# Patient Record
Sex: Male | Born: 1950
Health system: Southern US, Community
[De-identification: ages and names within clinical notes are randomized; demographics above are authoritative.]

## PROBLEM LIST (undated history)

## (undated) DIAGNOSIS — J45909 Unspecified asthma, uncomplicated: Secondary | ICD-10-CM

## (undated) DIAGNOSIS — M1990 Unspecified osteoarthritis, unspecified site: Secondary | ICD-10-CM

## (undated) DIAGNOSIS — I4892 Unspecified atrial flutter: Secondary | ICD-10-CM

## (undated) DIAGNOSIS — J189 Pneumonia, unspecified organism: Secondary | ICD-10-CM

## (undated) DIAGNOSIS — E119 Type 2 diabetes mellitus without complications: Secondary | ICD-10-CM

## (undated) HISTORY — PX: NO PAST SURGERIES: SHX2092

---
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
## 2003-12-03 ENCOUNTER — Emergency Department (HOSPITAL_COMMUNITY): Admission: EM | Admit: 2003-12-03 | Discharge: 2003-12-03 | Payer: Self-pay | Admitting: Emergency Medicine

## 2003-12-13 ENCOUNTER — Emergency Department (HOSPITAL_COMMUNITY): Admission: AD | Admit: 2003-12-13 | Discharge: 2003-12-13 | Payer: Self-pay | Admitting: Family Medicine

## 2012-09-19 ENCOUNTER — Other Ambulatory Visit: Payer: Self-pay | Admitting: Family Medicine

## 2012-09-19 DIAGNOSIS — M5412 Radiculopathy, cervical region: Secondary | ICD-10-CM

## 2012-09-21 ENCOUNTER — Ambulatory Visit
Admission: RE | Admit: 2012-09-21 | Discharge: 2012-09-21 | Disposition: A | Discharge status: Home / Self Care | Payer: No Typology Code available for payment source | Source: Ambulatory Visit | Attending: Family Medicine | Admitting: Family Medicine

## 2012-09-21 DIAGNOSIS — M5412 Radiculopathy, cervical region: Secondary | ICD-10-CM

## 2013-02-09 ENCOUNTER — Other Ambulatory Visit: Payer: Self-pay | Admitting: Neurosurgery

## 2013-02-23 ENCOUNTER — Encounter (HOSPITAL_COMMUNITY): Payer: Self-pay

## 2013-02-23 ENCOUNTER — Encounter (HOSPITAL_COMMUNITY)
Admission: RE | Admit: 2013-02-23 | Discharge: 2013-02-23 | Disposition: A | Discharge status: Home / Self Care | Payer: No Typology Code available for payment source | Source: Ambulatory Visit | Attending: Neurosurgery | Admitting: Neurosurgery

## 2013-02-23 HISTORY — DX: Unspecified asthma, uncomplicated: J45.909

## 2013-02-23 LAB — CBC
Hemoglobin: 15.4 g/dL (ref 13.0–17.0)
Platelets: 211 10*3/uL (ref 150–400)
RBC: 4.99 MIL/uL (ref 4.22–5.81)
WBC: 9 10*3/uL (ref 4.0–10.5)

## 2013-02-23 LAB — SURGICAL PCR SCREEN
MRSA, PCR: NEGATIVE
Staphylococcus aureus: NEGATIVE

## 2013-02-23 NOTE — Pre-Procedure Instructions (Signed)
Cameron Arroyo  02/23/2013   Your procedure is scheduled on:  Thurs,April 3 @ 12:30 PM  Report to [facility] at 9:30 AM.  Call this number if you have problems the morning of surgery: [phone]   Remember:   Do not eat food or drink liquids after midnight.      Do not wear jewelry  Do not wear lotions, powders, or colognes. You may wear deodorant.  Men may shave face and neck.  Do not bring valuables to the hospital.  Contacts, dentures or bridgework may not be worn into surgery.  Leave suitcase in the car. After surgery it may be brought to your room.  For patients admitted to the hospital, checkout time is 11:00 AM the day of  discharge.   Patients discharged the day of surgery will not be allowed to drive  home.    Special Instructions: Shower using CHG 2 nights before surgery and the night before surgery.  If you shower the day of surgery use CHG.  Use special wash - you have one bottle of CHG for all showers.  You should use approximately 1/3 of the bottle for each shower.   Please read over the following fact sheets that you were given: Pain Booklet, Coughing and Deep Breathing, MRSA Information and Surgical Site Infection Prevention

## 2013-02-23 NOTE — Pre-Procedure Instructions (Signed)
Cameron Arroyo  02/23/2013   Your procedure is scheduled on:  03-01-2013  Take East Elevators to 3rd floor  Report to [facility] at 9:30AM.  Call this number if you have problems the morning of surgery: [phone]   Remember:   Do not eat food or drink liquids after midnight.   Take these medicines the morning of surgery with A SIP OF WATER:none   Do not wear jewelry,  Do not wear lotions, powders, or perfumes. You may wear deodorant.  Do not shave 48 hours prior to surgery. Men may shave face and neck.  Do not bring valuables to the hospital.  Contacts, dentures or bridgework may not be worn into surgery.  Leave suitcase in the car. After surgery it may be brought to your room.   For patients admitted to the hospital, checkout time is 11:00 AM the day of discharge.   Patients discharged the day of surgery will not be allowed to drive home.    Special Instructions: Shower using CHG 2 nights before surgery and the night before surgery.  If you shower the day of surgery use CHG.  Use special wash - you have one bottle of CHG for all showers.  You should use approximately 1/3 of the bottle for each shower.   Please read over the following fact sheets that you were given: Pain Booklet and Surgical Site Infection Prevention

## 2013-02-28 MED ORDER — CEFAZOLIN SODIUM-DEXTROSE 2-3 GM-% IV SOLR
2.0000 g | INTRAVENOUS | Status: AC
  Administered 2013-03-01: 2 g via INTRAVENOUS
  Filled 2013-02-28: qty 50

## 2013-03-01 ENCOUNTER — Inpatient Hospital Stay (HOSPITAL_COMMUNITY)
Admission: RE | Admit: 2013-03-01 | Discharge: 2013-03-02 | DRG: 473 | Disposition: A | Discharge status: Home / Self Care | Payer: No Typology Code available for payment source | Source: Ambulatory Visit | Attending: Neurosurgery | Admitting: Neurosurgery

## 2013-03-01 ENCOUNTER — Encounter (HOSPITAL_COMMUNITY): Payer: Self-pay | Admitting: Anesthesiology

## 2013-03-01 ENCOUNTER — Inpatient Hospital Stay (HOSPITAL_COMMUNITY): Payer: No Typology Code available for payment source

## 2013-03-01 ENCOUNTER — Encounter (HOSPITAL_COMMUNITY): Payer: Self-pay | Admitting: *Deleted

## 2013-03-01 ENCOUNTER — Inpatient Hospital Stay (HOSPITAL_COMMUNITY): Payer: No Typology Code available for payment source | Admitting: Anesthesiology

## 2013-03-01 ENCOUNTER — Encounter (HOSPITAL_COMMUNITY)
Admission: RE | Disposition: A | Discharge status: Home / Self Care | Payer: Self-pay | Source: Ambulatory Visit | Attending: Neurosurgery

## 2013-03-01 DIAGNOSIS — J45909 Unspecified asthma, uncomplicated: Secondary | ICD-10-CM | POA: Diagnosis present

## 2013-03-01 DIAGNOSIS — Z87891 Personal history of nicotine dependence: Secondary | ICD-10-CM

## 2013-03-01 DIAGNOSIS — M47812 Spondylosis without myelopathy or radiculopathy, cervical region: Secondary | ICD-10-CM | POA: Diagnosis present

## 2013-03-01 DIAGNOSIS — M5030 Other cervical disc degeneration, unspecified cervical region: Secondary | ICD-10-CM | POA: Diagnosis present

## 2013-03-01 DIAGNOSIS — M5020 Other cervical disc displacement, unspecified cervical region: Principal | ICD-10-CM | POA: Diagnosis present

## 2013-03-01 HISTORY — PX: ANTERIOR CERVICAL DECOMP/DISCECTOMY FUSION: SHX1161

## 2013-03-01 SURGERY — ANTERIOR CERVICAL DECOMPRESSION/DISCECTOMY FUSION 3 LEVELS
Anesthesia: General | Wound class: Clean

## 2013-03-01 MED ORDER — SODIUM CHLORIDE 0.9 % IJ SOLN: 3.0000 mL | INTRAMUSCULAR | Status: DC | PRN

## 2013-03-01 MED ORDER — GLYCOPYRROLATE 0.2 MG/ML IJ SOLN
INTRAMUSCULAR | Status: DC | PRN
  Administered 2013-03-01: .6 mg via INTRAVENOUS

## 2013-03-01 MED ORDER — ACETAMINOPHEN 10 MG/ML IV SOLN
1000.0000 mg | Freq: Four times a day (QID) | INTRAVENOUS | Status: DC
  Administered 2013-03-02 (×2): 1000 mg via INTRAVENOUS
  Filled 2013-03-01 (×5): qty 100

## 2013-03-01 MED ORDER — NEOSTIGMINE METHYLSULFATE 1 MG/ML IJ SOLN
INTRAMUSCULAR | Status: DC | PRN
  Administered 2013-03-01: 5 mg via INTRAVENOUS

## 2013-03-01 MED ORDER — FENTANYL CITRATE 0.05 MG/ML IJ SOLN
INTRAMUSCULAR | Status: DC | PRN
  Administered 2013-03-01: 100 ug via INTRAVENOUS
  Administered 2013-03-01 (×2): 50 ug via INTRAVENOUS
  Administered 2013-03-01: 100 ug via INTRAVENOUS
  Administered 2013-03-01 (×3): 50 ug via INTRAVENOUS
  Administered 2013-03-01: 100 ug via INTRAVENOUS
  Administered 2013-03-01 (×2): 50 ug via INTRAVENOUS

## 2013-03-01 MED ORDER — ROCURONIUM BROMIDE 100 MG/10ML IV SOLN
INTRAVENOUS | Status: DC | PRN
  Administered 2013-03-01: 10 mg via INTRAVENOUS
  Administered 2013-03-01: 20 mg via INTRAVENOUS
  Administered 2013-03-01: 50 mg via INTRAVENOUS
  Administered 2013-03-01: 20 mg via INTRAVENOUS

## 2013-03-01 MED ORDER — BUPIVACAINE HCL (PF) 0.5 % IJ SOLN
INTRAMUSCULAR | Status: DC | PRN
  Administered 2013-03-01: 30 mL

## 2013-03-01 MED ORDER — MAGNESIUM HYDROXIDE 400 MG/5ML PO SUSP: 30.0000 mL | Freq: Every day | ORAL | Status: DC | PRN

## 2013-03-01 MED ORDER — CYCLOBENZAPRINE HCL 10 MG PO TABS: 10.0000 mg | ORAL_TABLET | Freq: Three times a day (TID) | ORAL | Status: DC | PRN

## 2013-03-01 MED ORDER — DEXAMETHASONE SODIUM PHOSPHATE 10 MG/ML IJ SOLN
INTRAMUSCULAR | Status: DC | PRN
  Administered 2013-03-01: 8 mg via INTRAVENOUS

## 2013-03-01 MED ORDER — MORPHINE SULFATE (PF) 1 MG/ML IV SOLN
INTRAVENOUS | Status: AC
  Filled 2013-03-01: qty 25

## 2013-03-01 MED ORDER — LIDOCAINE HCL (CARDIAC) 20 MG/ML IV SOLN
INTRAVENOUS | Status: DC | PRN
  Administered 2013-03-01: 100 mg via INTRAVENOUS

## 2013-03-01 MED ORDER — SODIUM CHLORIDE 0.9 % IR SOLN
Status: DC | PRN
  Administered 2013-03-01: 15:00:00

## 2013-03-01 MED ORDER — LACTATED RINGERS IV SOLN
INTRAVENOUS | Status: DC | PRN
  Administered 2013-03-01 (×4): via INTRAVENOUS

## 2013-03-01 MED ORDER — SODIUM CHLORIDE 0.9 % IJ SOLN: 3.0000 mL | Freq: Two times a day (BID) | INTRAMUSCULAR | Status: DC

## 2013-03-01 MED ORDER — HYDROMORPHONE HCL PF 1 MG/ML IJ SOLN
INTRAMUSCULAR | Status: AC
  Filled 2013-03-01: qty 1

## 2013-03-01 MED ORDER — MENTHOL 3 MG MT LOZG: 1.0000 | LOZENGE | OROMUCOSAL | Status: DC | PRN

## 2013-03-01 MED ORDER — THROMBIN 20000 UNITS EX SOLR
CUTANEOUS | Status: DC | PRN
  Administered 2013-03-01: 15:00:00 via TOPICAL

## 2013-03-01 MED ORDER — KETOROLAC TROMETHAMINE 30 MG/ML IJ SOLN
INTRAMUSCULAR | Status: AC
  Filled 2013-03-01: qty 1

## 2013-03-01 MED ORDER — THROMBIN 5000 UNITS EX SOLR
OROMUCOSAL | Status: DC | PRN
  Administered 2013-03-01: 16:00:00 via TOPICAL

## 2013-03-01 MED ORDER — LIDOCAINE HCL 4 % MT SOLN
OROMUCOSAL | Status: DC | PRN
  Administered 2013-03-01: 4 mL via TOPICAL

## 2013-03-01 MED ORDER — KCL IN DEXTROSE-NACL 20-5-0.45 MEQ/L-%-% IV SOLN
INTRAVENOUS | Status: DC
  Administered 2013-03-01: 19:00:00 via INTRAVENOUS
  Filled 2013-03-01 (×3): qty 1000

## 2013-03-01 MED ORDER — OXYCODONE HCL 5 MG PO TABS: 5.0000 mg | ORAL_TABLET | ORAL | Status: DC | PRN

## 2013-03-01 MED ORDER — ALBUTEROL SULFATE HFA 108 (90 BASE) MCG/ACT IN AERS
INHALATION_SPRAY | RESPIRATORY_TRACT | Status: DC | PRN
  Administered 2013-03-01: 2 via RESPIRATORY_TRACT

## 2013-03-01 MED ORDER — PROPOFOL 10 MG/ML IV BOLUS
INTRAVENOUS | Status: DC | PRN
  Administered 2013-03-01: 200 mg via INTRAVENOUS

## 2013-03-01 MED ORDER — SUCCINYLCHOLINE CHLORIDE 20 MG/ML IJ SOLN
INTRAMUSCULAR | Status: DC | PRN
  Administered 2013-03-01: 120 mg via INTRAVENOUS

## 2013-03-01 MED ORDER — KETOROLAC TROMETHAMINE 30 MG/ML IJ SOLN
30.0000 mg | Freq: Four times a day (QID) | INTRAMUSCULAR | Status: DC
  Administered 2013-03-02 (×2): 30 mg via INTRAVENOUS
  Filled 2013-03-01 (×6): qty 1

## 2013-03-01 MED ORDER — THROMBIN 5000 UNITS EX SOLR
CUTANEOUS | Status: DC | PRN
  Administered 2013-03-01: 5000 [IU] via TOPICAL

## 2013-03-01 MED ORDER — KCL IN DEXTROSE-NACL 20-5-0.45 MEQ/L-%-% IV SOLN
INTRAVENOUS | Status: AC
  Filled 2013-03-01: qty 1000

## 2013-03-01 MED ORDER — ONDANSETRON HCL 4 MG/2ML IJ SOLN: 4.0000 mg | Freq: Four times a day (QID) | INTRAMUSCULAR | Status: DC | PRN

## 2013-03-01 MED ORDER — KETOROLAC TROMETHAMINE 30 MG/ML IJ SOLN
30.0000 mg | Freq: Once | INTRAMUSCULAR | Status: AC
  Administered 2013-03-01: 30 mg via INTRAVENOUS

## 2013-03-01 MED ORDER — ACETAMINOPHEN 325 MG PO TABS: 650.0000 mg | ORAL_TABLET | ORAL | Status: DC | PRN

## 2013-03-01 MED ORDER — MORPHINE SULFATE 4 MG/ML IJ SOLN: 4.0000 mg | INTRAMUSCULAR | Status: DC | PRN

## 2013-03-01 MED ORDER — HYDROMORPHONE HCL PF 1 MG/ML IJ SOLN: 0.2500 mg | INTRAMUSCULAR | Status: DC | PRN

## 2013-03-01 MED ORDER — BISACODYL 10 MG RE SUPP: 10.0000 mg | Freq: Every day | RECTAL | Status: DC | PRN

## 2013-03-01 MED ORDER — LIDOCAINE-EPINEPHRINE 1 %-1:100000 IJ SOLN
INTRAMUSCULAR | Status: DC | PRN
  Administered 2013-03-01: 20 mL

## 2013-03-01 MED ORDER — BACITRACIN 50000 UNITS IM SOLR
INTRAMUSCULAR | Status: AC
  Filled 2013-03-01: qty 1

## 2013-03-01 MED ORDER — ACETAMINOPHEN 10 MG/ML IV SOLN
INTRAVENOUS | Status: AC
  Administered 2013-03-01: 1000 mg via INTRAVENOUS
  Filled 2013-03-01: qty 100

## 2013-03-01 MED ORDER — ALUM & MAG HYDROXIDE-SIMETH 200-200-20 MG/5ML PO SUSP: 30.0000 mL | Freq: Four times a day (QID) | ORAL | Status: DC | PRN

## 2013-03-01 MED ORDER — ZOLPIDEM TARTRATE 5 MG PO TABS: 5.0000 mg | ORAL_TABLET | Freq: Every evening | ORAL | Status: DC | PRN

## 2013-03-01 MED ORDER — MIDAZOLAM HCL 5 MG/5ML IJ SOLN
INTRAMUSCULAR | Status: DC | PRN
  Administered 2013-03-01 (×2): 1 mg via INTRAVENOUS

## 2013-03-01 MED ORDER — HYDROXYZINE HCL 25 MG PO TABS: 50.0000 mg | ORAL_TABLET | ORAL | Status: DC | PRN

## 2013-03-01 MED ORDER — ACETAMINOPHEN 650 MG RE SUPP: 650.0000 mg | RECTAL | Status: DC | PRN

## 2013-03-01 MED ORDER — SODIUM CHLORIDE 0.9 % IV SOLN
INTRAVENOUS | Status: AC
  Filled 2013-03-01: qty 500

## 2013-03-01 MED ORDER — SODIUM CHLORIDE 0.9 % IV SOLN: 250.0000 mL | INTRAVENOUS | Status: DC

## 2013-03-01 MED ORDER — ONDANSETRON HCL 4 MG/2ML IJ SOLN
INTRAMUSCULAR | Status: DC | PRN
  Administered 2013-03-01: 4 mg via INTRAVENOUS

## 2013-03-01 MED ORDER — 0.9 % SODIUM CHLORIDE (POUR BTL) OPTIME
TOPICAL | Status: DC | PRN
  Administered 2013-03-01 (×2): 1000 mL

## 2013-03-01 MED ORDER — PHENOL 1.4 % MT LIQD: 1.0000 | OROMUCOSAL | Status: DC | PRN

## 2013-03-01 SURGICAL SUPPLY — 59 items
ALLOGRAFT CA 6X14X11 (Bone Implant) ×6 IMPLANT
BAG DECANTER FOR FLEXI CONT (MISCELLANEOUS) ×2 IMPLANT
BIT DRILL NEURO 2X3.1 SFT TUCH (MISCELLANEOUS) ×2 IMPLANT
BLADE ULTRA TIP 2M (BLADE) ×2 IMPLANT
BRUSH SCRUB EZ PLAIN DRY (MISCELLANEOUS) ×2 IMPLANT
CANISTER SUCTION 2500CC (MISCELLANEOUS) ×2 IMPLANT
CLOTH BEACON ORANGE TIMEOUT ST (SAFETY) ×4 IMPLANT
CONT SPEC 4OZ CLIKSEAL STRL BL (MISCELLANEOUS) ×2 IMPLANT
COVER MAYO STAND STRL (DRAPES) ×2 IMPLANT
DECANTER SPIKE VIAL GLASS SM (MISCELLANEOUS) ×2 IMPLANT
DERMABOND ADHESIVE PROPEN (GAUZE/BANDAGES/DRESSINGS) ×1
DERMABOND ADVANCED (GAUZE/BANDAGES/DRESSINGS) ×1
DERMABOND ADVANCED .7 DNX12 (GAUZE/BANDAGES/DRESSINGS) ×1 IMPLANT
DERMABOND ADVANCED .7 DNX6 (GAUZE/BANDAGES/DRESSINGS) ×1 IMPLANT
DRAPE LAPAROTOMY 100X72 PEDS (DRAPES) ×2 IMPLANT
DRAPE MICROSCOPE LEICA (MISCELLANEOUS) ×2 IMPLANT
DRAPE POUCH INSTRU U-SHP 10X18 (DRAPES) ×2 IMPLANT
DRAPE PROXIMA HALF (DRAPES) IMPLANT
DRILL NEURO 2X3.1 SOFT TOUCH (MISCELLANEOUS) ×4
ELECT COATED BLADE 2.86 ST (ELECTRODE) ×2 IMPLANT
ELECT REM PT RETURN 9FT ADLT (ELECTROSURGICAL) ×2
ELECTRODE REM PT RTRN 9FT ADLT (ELECTROSURGICAL) ×1 IMPLANT
GLOVE BIOGEL PI IND STRL 7.0 (GLOVE) ×2 IMPLANT
GLOVE BIOGEL PI IND STRL 7.5 (GLOVE) ×2 IMPLANT
GLOVE BIOGEL PI IND STRL 8 (GLOVE) ×1 IMPLANT
GLOVE BIOGEL PI INDICATOR 7.0 (GLOVE) ×2
GLOVE BIOGEL PI INDICATOR 7.5 (GLOVE) ×2
GLOVE BIOGEL PI INDICATOR 8 (GLOVE) ×1
GLOVE ECLIPSE 6.5 STRL STRAW (GLOVE) ×4 IMPLANT
GLOVE ECLIPSE 7.5 STRL STRAW (GLOVE) ×4 IMPLANT
GLOVE EXAM NITRILE LRG STRL (GLOVE) IMPLANT
GLOVE EXAM NITRILE MD LF STRL (GLOVE) IMPLANT
GLOVE EXAM NITRILE XL STR (GLOVE) IMPLANT
GLOVE EXAM NITRILE XS STR PU (GLOVE) IMPLANT
GLOVE SURG SS PI 7.0 STRL IVOR (GLOVE) ×4 IMPLANT
GOWN BRE IMP SLV AUR LG STRL (GOWN DISPOSABLE) IMPLANT
GOWN BRE IMP SLV AUR XL STRL (GOWN DISPOSABLE) ×8 IMPLANT
GOWN STRL REIN 2XL LVL4 (GOWN DISPOSABLE) IMPLANT
HEAD HALTER (SOFTGOODS) ×2 IMPLANT
HEMOSTAT POWDER KIT SURGIFOAM (HEMOSTASIS) ×2 IMPLANT
KIT BASIN OR (CUSTOM PROCEDURE TRAY) ×2 IMPLANT
KIT ROOM TURNOVER OR (KITS) ×2 IMPLANT
NEEDLE HYPO 25X1 1.5 SAFETY (NEEDLE) ×2 IMPLANT
NEEDLE SPNL 22GX3.5 QUINCKE BK (NEEDLE) ×2 IMPLANT
NS IRRIG 1000ML POUR BTL (IV SOLUTION) ×2 IMPLANT
PACK LAMINECTOMY NEURO (CUSTOM PROCEDURE TRAY) ×2 IMPLANT
PAD ARMBOARD 7.5X6 YLW CONV (MISCELLANEOUS) ×6 IMPLANT
RUBBERBAND STERILE (MISCELLANEOUS) ×4 IMPLANT
SPONGE INTESTINAL PEANUT (DISPOSABLE) ×2 IMPLANT
SPONGE SURGIFOAM ABS GEL 100 (HEMOSTASIS) ×2 IMPLANT
STAPLER SKIN PROX WIDE 3.9 (STAPLE) ×2 IMPLANT
SUT VIC AB 0 CT1 18XCR BRD8 (SUTURE) IMPLANT
SUT VIC AB 0 CT1 8-18 (SUTURE)
SUT VIC AB 2-0 CP2 18 (SUTURE) ×2 IMPLANT
SUT VIC AB 3-0 SH 8-18 (SUTURE) ×2 IMPLANT
SYR 20ML ECCENTRIC (SYRINGE) ×2 IMPLANT
TOWEL OR 17X24 6PK STRL BLUE (TOWEL DISPOSABLE) ×2 IMPLANT
TOWEL OR 17X26 10 PK STRL BLUE (TOWEL DISPOSABLE) ×2 IMPLANT
WATER STERILE IRR 1000ML POUR (IV SOLUTION) ×2 IMPLANT

## 2013-03-01 NOTE — Progress Notes (Signed)
Filed Vitals:   03/01/13 1800 03/01/13 1815 03/01/13 1830 03/01/13 1845  BP: 126/64 138/68 128/76 134/72  Pulse:      Temp:      TempSrc:      Resp:  14    SpO2:  97%      Patient resting in recovery room. Awake but lethargic, following commands. Wound clean and dry. Moving all 4 extremities well. For transfer to 3500 unit.  Plan: We'll progress through postoperative recovery.  Hewitt Shorts, MD 03/01/2013, 7:20 PM

## 2013-03-01 NOTE — Anesthesia Postprocedure Evaluation (Signed)
  Anesthesia Post-op Note  Patient: Cameron Arroyo  Procedure(s) Performed: Procedure(s) with comments: Cervical Four-Five Cervical Five-Six Cervical Six-Seven anterior cervical decompression with fusion plating and bonegraft (N/A) - ANTERIOR CERVICAL DECOMPRESSION/DISCECTOMY FUSION 3 LEVELS  Patient Location: PACU  Anesthesia Type:General  Level of Consciousness: awake  Airway and Oxygen Therapy: Patient Spontanous Breathing  Post-op Pain: mild  Post-op Assessment: Post-op Vital signs reviewed  Post-op Vital Signs: Reviewed  Complications: No apparent anesthesia complications

## 2013-03-01 NOTE — H&P (Signed)
Subjective: Patient is a 62 y.o. male who is admitted for treatment of multilevel cervical herniated discs, superimposed upon underlying degenerative disease and spondylosis, presenting with right cervical radiculopathy and cervicalgia. Patient found to have large disc herniations at C4-5, C5-6, and C6-7, and admitted for a three-level C4-5, C5-6, and C6-7 anterior cervical decompression arthrodesis with allograft and cervical plating.   Past Medical History  Diagnosis Date  . Asthma     as child    Past Surgical History  Procedure Laterality Date  . No past surgeries      No prescriptions prior to admission   No Known Allergies  History  Substance Use Topics  . Smoking status: Former Smoker    Quit date: 11/29/1992  . Smokeless tobacco: Not on file  . Alcohol Use: No    No family history on file.   Review of Systems A comprehensive review of systems was negative.  Objective: Vital signs in last 24 hours:    EXAM: Patient is a well-developed well-nourished white male in no acute distress. Lungs are clear to auscultation , the patient has symmetrical respiratory excursion. Heart has a regular rate and rhythm normal S1 and S2 no murmur.   Abdomen is soft nontender nondistended bowel sounds are present. Extremity examination shows no clubbing cyanosis or edema. Musculoskeletal examination shows no tenderness to palpation over the cervical spinous processes or paracervical musculature. He has a full range of motion neck through flexion, extension, and lateral flexion to either side. Motor examination shows 5 over 5 strength in the upper extremities including the deltoid biceps triceps and intrinsics and grip. Sensation is intact to pinprick throughout the digits of the upper extremities. Reflexes are symmetrical and without evidence of pathologic reflexes. Patient has a normal gait and stance.   Data Review:CBC    Component Value Date/Time   WBC 9.0 02/23/2013 1517   RBC 4.99  02/23/2013 1517   HGB 15.4 02/23/2013 1517   HCT 45.5 02/23/2013 1517   PLT 211 02/23/2013 1517   MCV 91.2 02/23/2013 1517   MCH 30.9 02/23/2013 1517   MCHC 33.8 02/23/2013 1517   RDW 12.3 02/23/2013 1517    Assessment/Plan: Patient with disc herniations at C4-5, C5-6, and C6-7, admitted for three-level ACDF.  I've discussed with the patient the nature of his condition, the nature the surgical procedure, the typical length of surgery, hospital stay, and overall recuperation. We discussed limitations postoperatively. I discussed risks of surgery including risks of infection, bleeding, possibly need for transfusion, the risk of nerve root dysfunction with pain, weakness, numbness, or paresthesias, the risk of spinal cord dysfunction with paralysis of all 4 limbs and quadriplegia, and the risk of dural tear and CSF leakage and possible need for further surgery, the risk of esophageal dysfunction causing dysphagia and the risk of laryngeal dysfunction causing hoarseness of the voice, the risk of failure of the arthrodesis and the possible need for further surgery, and the risk of anesthetic complications including myocardial infarction, stroke, pneumonia, and death. We also discussed the need for postoperative immobilization in a cervical collar. Understanding all this the patient does wish to proceed with surgery and is admitted for such.    Hewitt Shorts, MD 03/01/2013 7:26 AM

## 2013-03-01 NOTE — Anesthesia Procedure Notes (Signed)
Procedure Name: Intubation Date/Time: 03/01/2013 1:42 PM Performed by: Sherie Don Pre-anesthesia Checklist: Patient identified, Emergency Drugs available, Suction available, Patient being monitored and Timeout performed Patient Re-evaluated:Patient Re-evaluated prior to inductionOxygen Delivery Method: Circle system utilized Preoxygenation: Pre-oxygenation with 100% oxygen Intubation Type: IV induction Ventilation: Mask ventilation without difficulty Laryngoscope Size: Mac and 4 Grade View: Grade III Tube type: Oral Tube size: 8.0 mm Number of attempts: 1 Airway Equipment and Method: Stylet Placement Confirmation: ETT inserted through vocal cords under direct vision,  positive ETCO2 and breath sounds checked- equal and bilateral Secured at: 23 cm Tube secured with: Tape Dental Injury: Teeth and Oropharynx as per pre-operative assessment

## 2013-03-01 NOTE — Transfer of Care (Signed)
Immediate Anesthesia Transfer of Care Note  Patient: Cameron Arroyo  Procedure(s) Performed: Procedure(s) with comments: Cervical Four-Five Cervical Five-Six Cervical Six-Seven anterior cervical decompression with fusion plating and bonegraft (N/A) - ANTERIOR CERVICAL DECOMPRESSION/DISCECTOMY FUSION 3 LEVELS  Patient Location: PACU  Anesthesia Type:General  Level of Consciousness: awake and oriented  Airway & Oxygen Therapy: Patient Spontanous Breathing and Patient connected to face mask oxygen  Post-op Assessment: Report given to PACU RN, Post -op Vital signs reviewed and stable and Patient moving all extremities  Post vital signs: Reviewed and stable  Complications: No apparent anesthesia complications

## 2013-03-01 NOTE — Preoperative (Signed)
Beta Blockers   Reason not to administer Beta Blockers:Not Applicable 

## 2013-03-01 NOTE — Anesthesia Preprocedure Evaluation (Addendum)
Anesthesia Evaluation  Patient identified by MRN, date of birth, ID band Patient awake    Reviewed: Allergy & Precautions, H&P , NPO status , Patient's Chart, lab work & pertinent test results  Airway Mallampati: II      Dental   Pulmonary asthma ,  breath sounds clear to auscultation        Cardiovascular Rhythm:Regular Rate:Normal     Neuro/Psych    GI/Hepatic negative GI ROS, Neg liver ROS,   Endo/Other  negative endocrine ROS  Renal/GU negative Renal ROS     Musculoskeletal   Abdominal   Peds  Hematology   Anesthesia Other Findings   Reproductive/Obstetrics                          Anesthesia Physical Anesthesia Plan  ASA: III  Anesthesia Plan: General   Post-op Pain Management:    Induction: Intravenous  Airway Management Planned: Oral ETT  Additional Equipment:   Intra-op Plan:   Post-operative Plan: Extubation in OR  Informed Consent: I have reviewed the patients History and Physical, chart, labs and discussed the procedure including the risks, benefits and alternatives for the proposed anesthesia with the patient or authorized representative who has indicated his/her understanding and acceptance.   Dental advisory given  Plan Discussed with: CRNA, Anesthesiologist and Surgeon  Anesthesia Plan Comments:         Anesthesia Quick Evaluation

## 2013-03-01 NOTE — Op Note (Signed)
03/01/2013  5:03 PM  PATIENT:  Cameron Arroyo  62 y.o. male  PRE-OPERATIVE DIAGNOSIS: C4-5, C5-6, and C6-7 cervical herniated disc, cervical stenosis, cervical spondylosis, cervical degenerative disc disease, right cervical radiculopathy  POST-OPERATIVE DIAGNOSIS:  C4-5, C5-6, and C6-7 cervical herniated disc, cervical stenosis, cervical spondylosis, cervical degenerative disc disease, right cervical radiculopathy   PROCEDURE:  Procedure(s): Cervical Four-Five Cervical Five-Six Cervical Six-Seven anterior cervical decompression and arthrodesis with allograft and tether cervical plating  SURGEON:  Surgeon(s): Hewitt Shorts, MD Clydene Fake, MD  ASSISTANTS: Clydene Fake, M.D.  ANESTHESIA:   general  EBL:  Total I/O In: 2000 [I.V.:2000] Out: -   BLOOD ADMINISTERED:none  COUNT: Correct per nursing staff  DICTATION: Patient was brought to the operating room placed under general endotracheal anesthesia. Patient was placed in 10 pounds of halter traction. The neck was prepped with Betadine soap and solution and draped in a sterile fashion. A obliquel incision was made on the left side of the neck paralleling the anterior border of the sternocleidomastoid.. The line of the incision was infiltrated with local anesthetic with epinephrine. Dissection was carried down thru the subcutaneous tissue and platysma, bipolar cautery was used to maintain hemostasis. Dissection was then carried out thru an avascular plane leaving the sternocleidomastoid carotid artery and jugular vein laterally and the trachea and esophagus medially. The ventral aspect of the vertebral column was identified and a localizing x-ray was taken. The C4-5, C5-6, and C6-7 levels were identified. The annulus at each level was incised and the disc space entered. Discectomy was performed with micro-curettes and pituitary rongeurs. The operating microscope was draped and brought into the field provided additional magnification  illumination and visualization. Discectomy was continued posteriorly thru the disc space and then the cartilaginous endplate was removed using micro-curettes along with the high-speed drill. Posterior osteophytic overgrowth was removed at each level using the high-speed drill along with a 2 mm thin footplated Kerrison punch. Posterior longitudinal ligament along with disc herniation was carefully removed, decompressing the spinal canal and thecal sac. We then continued to remove osteophytic overgrowth and disc material decompressing the neural foramina and exiting nerve roots bilaterally. Once the decompression was completed hemostasis was established at each level with the use of Gelfoam with thrombin and bipolar cautery. The Gelfoam was removed the wound irrigated and hemostasis confirmed. We then measured the height of each intravertebral disc space level and selected a  6 millimeter in height structural allograft for the C4-5 level, a 6 millimeter in height structural allograft for the C5-6 level, and a 6 millimeter in height structural allograft for the C6-7 level . Each was hydrated in saline solution and then gently positioned in the intravertebral disc space and countersunk. We then selected a 55 millimeter in height Tether cervical plate. It was positioned over the fusion construct and secured to the vertebra with a pair of 4 x 13 mm fixed screws at C4, a 4 x 16 mm variable screw at C5, a 4 x 16 mm fixed screw at C6, and a pair of 4 x 16 mm variable screws at C7. Each screw hole was started with the high-speed drill and then the screws placed, once all the screws were placed final tightening was performed. The wound was irrigated with bacitracin solution checked for hemostasis which was established and confirmed. An x-ray was taken which showed grafts in all good position, the plate and screws in good position, and the overall alignment to be good. We were only  able to visualize down to the C6 level, but  under direct visualization the portion of the fusion construct at the C7 level looked good. We then proceeded with closure. The platysma was closed with interrupted inverted 2-0 undyed Vicryl suture, the subcutaneous and subcuticular closed with interrupted inverted 3-0 undyed Vicryl suture. The skin edges were approximated with Dermabond. Following surgery the patient was taken out of cervical traction. To be reversed and the anesthetic and taken to the recovery room for further care.   PLAN OF CARE: Admit to inpatient   PATIENT DISPOSITION:  PACU - hemodynamically stable.   Delay start of Pharmacological VTE agent (>24hrs) due to surgical blood loss or risk of bleeding:  yes

## 2013-03-01 NOTE — Anesthesia Postprocedure Evaluation (Signed)
Anesthesia Post Note  Patient: Cameron Arroyo  Procedure(s) Performed: Procedure(s) (LRB): Cervical Four-Five Cervical Five-Six Cervical Six-Seven anterior cervical decompression with fusion plating and bonegraft (N/A)  Anesthesia type: General  Patient location: PACU  Post pain: Pain level controlled and Adequate analgesia  Post assessment: Post-op Vital signs reviewed, Patient's Cardiovascular Status Stable, Respiratory Function Stable, Patent Airway and Pain level controlled  Last Vitals:  Filed Vitals:   03/01/13 1719  BP: 138/86  Pulse:   Temp: 36.7 C  Resp: 16    Post vital signs: Reviewed and stable  Level of consciousness: awake, alert  and oriented  Complications: No apparent anesthesia complications

## 2013-03-02 ENCOUNTER — Encounter (HOSPITAL_COMMUNITY): Payer: Self-pay | Admitting: Neurosurgery

## 2013-03-02 MED ORDER — HYDROCODONE-ACETAMINOPHEN 5-325 MG PO TABS: 1.0000 | ORAL_TABLET | ORAL | Status: DC | PRN

## 2013-03-02 NOTE — Discharge Summary (Signed)
Physician Discharge Summary  Patient ID: Cameron Arroyo MRN: [id number] DOB/AGE: [birth year] 62 y.o.  Admit date: 03/01/2013 Discharge date: 03/02/2013  Admission Diagnoses:  C4-5, C5-6, and C6-7 cervical herniated disc, cervical stenosis, cervical spondylosis, cervical degenerative disc disease, right cervical radiculopathy   Discharge Diagnoses:  C4-5, C5-6, and C6-7 cervical herniated disc, cervical stenosis, cervical spondylosis, cervical degenerative disc disease, right cervical radiculopathy   Discharged Condition: good  Hospital Course: Patient was admitted, underwent a 3 level CIV-5, C5-6, and C6-7 ACDF. Postoperatively he has done well. He is up and doing. Mild sore throat with swallowing. Wound is clean and dry. He is being discharged home with instructions regarding wound care and activities. He is to return for followup with me in 3 weeks.  Discharge Exam: Blood pressure 109/65, pulse 64, temperature 98.4 F (36.9 C), temperature source Oral, resp. rate 16, SpO2 94.00%.  Disposition: Home     Medication List    TAKE these medications       HYDROcodone-acetaminophen 5-325 MG per tablet  Commonly known as:  NORCO/VICODIN  Take 1-2 tablets by mouth every 4 (four) hours as needed for pain.         Signed: Hewitt Shorts, MD 03/02/2013, 8:20 AM

## 2013-03-02 NOTE — Progress Notes (Signed)
Pt doing very well. Pt and wife given D/C instructions with Rx, verbal understanding given by Pt. Pt D/C'd home via wheelchair @ 1125 per MD order. Rema Fendt, RN

## 2013-03-02 NOTE — Progress Notes (Signed)
Utilization review completed. Horace Lukas, RN, BSN. 

## 2019-08-08 DIAGNOSIS — M25562 Pain in left knee: Secondary | ICD-10-CM | POA: Insufficient documentation

## 2019-08-20 ENCOUNTER — Encounter (HOSPITAL_COMMUNITY): Payer: Self-pay | Admitting: Emergency Medicine

## 2019-08-20 ENCOUNTER — Inpatient Hospital Stay (HOSPITAL_COMMUNITY)
Admission: EM | Admit: 2019-08-20 | Discharge: 2019-08-23 | DRG: 638 | Disposition: A | Discharge status: Home / Self Care | Payer: 59 | Source: Ambulatory Visit | Attending: Family Medicine | Admitting: Family Medicine

## 2019-08-20 ENCOUNTER — Emergency Department (HOSPITAL_COMMUNITY): Payer: 59

## 2019-08-20 ENCOUNTER — Ambulatory Visit (HOSPITAL_COMMUNITY)
Admission: EM | Admit: 2019-08-20 | Discharge: 2019-08-20 | Disposition: A | Discharge status: Home / Self Care | Payer: 59 | Source: Home / Self Care | Attending: Family Medicine | Admitting: Family Medicine

## 2019-08-20 ENCOUNTER — Other Ambulatory Visit: Payer: Self-pay

## 2019-08-20 DIAGNOSIS — R000 Tachycardia, unspecified: Secondary | ICD-10-CM | POA: Diagnosis not present

## 2019-08-20 DIAGNOSIS — R739 Hyperglycemia, unspecified: Secondary | ICD-10-CM | POA: Diagnosis not present

## 2019-08-20 DIAGNOSIS — E1165 Type 2 diabetes mellitus with hyperglycemia: Principal | ICD-10-CM | POA: Diagnosis present

## 2019-08-20 DIAGNOSIS — D72829 Elevated white blood cell count, unspecified: Secondary | ICD-10-CM | POA: Diagnosis present

## 2019-08-20 DIAGNOSIS — I4892 Unspecified atrial flutter: Secondary | ICD-10-CM

## 2019-08-20 DIAGNOSIS — R0609 Other forms of dyspnea: Secondary | ICD-10-CM

## 2019-08-20 DIAGNOSIS — E861 Hypovolemia: Secondary | ICD-10-CM | POA: Diagnosis present

## 2019-08-20 DIAGNOSIS — T380X5A Adverse effect of glucocorticoids and synthetic analogues, initial encounter: Secondary | ICD-10-CM | POA: Diagnosis present

## 2019-08-20 DIAGNOSIS — I483 Typical atrial flutter: Secondary | ICD-10-CM | POA: Diagnosis present

## 2019-08-20 DIAGNOSIS — R3589 Other polyuria: Secondary | ICD-10-CM

## 2019-08-20 DIAGNOSIS — I480 Paroxysmal atrial fibrillation: Secondary | ICD-10-CM | POA: Diagnosis present

## 2019-08-20 DIAGNOSIS — E119 Type 2 diabetes mellitus without complications: Secondary | ICD-10-CM

## 2019-08-20 DIAGNOSIS — E0865 Diabetes mellitus due to underlying condition with hyperglycemia: Secondary | ICD-10-CM

## 2019-08-20 DIAGNOSIS — I9589 Other hypotension: Secondary | ICD-10-CM | POA: Diagnosis present

## 2019-08-20 DIAGNOSIS — Z20828 Contact with and (suspected) exposure to other viral communicable diseases: Secondary | ICD-10-CM | POA: Diagnosis present

## 2019-08-20 DIAGNOSIS — Z87891 Personal history of nicotine dependence: Secondary | ICD-10-CM

## 2019-08-20 DIAGNOSIS — Z981 Arthrodesis status: Secondary | ICD-10-CM

## 2019-08-20 DIAGNOSIS — R634 Abnormal weight loss: Secondary | ICD-10-CM | POA: Diagnosis present

## 2019-08-20 DIAGNOSIS — R358 Other polyuria: Secondary | ICD-10-CM

## 2019-08-20 DIAGNOSIS — M25562 Pain in left knee: Secondary | ICD-10-CM | POA: Diagnosis present

## 2019-08-20 HISTORY — DX: Unspecified atrial flutter: I48.92

## 2019-08-20 HISTORY — DX: Type 2 diabetes mellitus without complications: E11.9

## 2019-08-20 LAB — BASIC METABOLIC PANEL
Anion gap: 11 (ref 5–15)
BUN: 21 mg/dL (ref 8–23)
CO2: 24 mmol/L (ref 22–32)
Calcium: 9.3 mg/dL (ref 8.9–10.3)
Chloride: 102 mmol/L (ref 98–111)
Creatinine, Ser: 0.97 mg/dL (ref 0.61–1.24)
GFR calc Af Amer: >60 mL/min (ref >60)
GFR calc non Af Amer: >60 mL/min (ref >60)
Glucose, Bld: 430 mg/dL — ABNORMAL HIGH (ref 70–99)
Potassium: 4.9 mmol/L (ref 3.5–5.1)
Sodium: 137 mmol/L (ref 135–145)

## 2019-08-20 LAB — CBC
HCT: 49 % (ref 39.0–52.0)
Hemoglobin: 16.9 g/dL (ref 13.0–17.0)
MCH: 32.5 pg (ref 26.0–34.0)
MCHC: 34.5 g/dL (ref 30.0–36.0)
MCV: 94.2 fL (ref 80.0–100.0)
Platelets: 214 10*3/uL (ref 150–400)
RBC: 5.2 MIL/uL (ref 4.22–5.81)
RDW: 12.2 % (ref 11.5–15.5)
WBC: 12.7 10*3/uL — ABNORMAL HIGH (ref 4.0–10.5)
nRBC: 0 % (ref 0.0–0.2)

## 2019-08-20 LAB — RAPID URINE DRUG SCREEN, HOSP PERFORMED
Amphetamines: NOT DETECTED
Barbiturates: NOT DETECTED
Benzodiazepines: NOT DETECTED
Cocaine: NOT DETECTED
Opiates: NOT DETECTED
Tetrahydrocannabinol: NOT DETECTED

## 2019-08-20 LAB — URINALYSIS, ROUTINE W REFLEX MICROSCOPIC
Bilirubin Urine: NEGATIVE
Glucose, UA: >=500 mg/dL — AB
Hgb urine dipstick: NEGATIVE
Ketones, ur: 80 mg/dL — AB
Leukocytes,Ua: NEGATIVE
Nitrite: NEGATIVE
Protein, ur: NEGATIVE mg/dL
Specific Gravity, Urine: 1.036 — ABNORMAL HIGH (ref 1.005–1.030)
pH: 5 (ref 5.0–8.0)

## 2019-08-20 LAB — TSH: TSH: 1.194 u[IU]/mL (ref 0.350–4.500)

## 2019-08-20 LAB — TROPONIN I (HIGH SENSITIVITY)
Troponin I (High Sensitivity): 14 ng/L (ref <18)
Troponin I (High Sensitivity): 14 ng/L (ref <18)

## 2019-08-20 LAB — MAGNESIUM: Magnesium: 2.5 mg/dL — ABNORMAL HIGH (ref 1.7–2.4)

## 2019-08-20 LAB — T4, FREE: Free T4: 1.05 ng/dL (ref 0.61–1.12)

## 2019-08-20 LAB — SARS CORONAVIRUS 2 (TAT 6-24 HRS): SARS Coronavirus 2: NEGATIVE

## 2019-08-20 LAB — HEMOGLOBIN A1C
Hgb A1c MFr Bld: 11.5 % — ABNORMAL HIGH (ref 4.8–5.6)
Mean Plasma Glucose: 283.35 mg/dL

## 2019-08-20 LAB — CBG MONITORING, ED: Glucose-Capillary: 399 mg/dL — ABNORMAL HIGH (ref 70–99)

## 2019-08-20 MED ORDER — ONDANSETRON HCL 4 MG PO TABS: 4.0000 mg | ORAL_TABLET | Freq: Four times a day (QID) | ORAL | Status: DC | PRN

## 2019-08-20 MED ORDER — MAGNESIUM SULFATE 2 GM/50ML IV SOLN
2.0000 g | Freq: Once | INTRAVENOUS | Status: DC
  Administered 2019-08-20: 2 g via INTRAVENOUS
  Filled 2019-08-20: qty 50

## 2019-08-20 MED ORDER — ACETAMINOPHEN 325 MG PO TABS: 650.0000 mg | ORAL_TABLET | Freq: Four times a day (QID) | ORAL | Status: DC | PRN

## 2019-08-20 MED ORDER — SODIUM CHLORIDE 0.9 % IV BOLUS
1000.0000 mL | Freq: Once | INTRAVENOUS | Status: AC
  Administered 2019-08-20: 1000 mL via INTRAVENOUS

## 2019-08-20 MED ORDER — ACETAMINOPHEN 650 MG RE SUPP: 650.0000 mg | Freq: Four times a day (QID) | RECTAL | Status: DC | PRN

## 2019-08-20 MED ORDER — HEPARIN BOLUS VIA INFUSION
4000.0000 [IU] | Freq: Once | INTRAVENOUS | Status: AC
  Administered 2019-08-20: 4000 [IU] via INTRAVENOUS
  Filled 2019-08-20: qty 4000

## 2019-08-20 MED ORDER — DILTIAZEM HCL-DEXTROSE 100-5 MG/100ML-% IV SOLN (PREMIX)
5.0000 mg/h | INTRAVENOUS | Status: DC
  Administered 2019-08-20: 5 mg/h via INTRAVENOUS
  Filled 2019-08-20 (×2): qty 100

## 2019-08-20 MED ORDER — DILTIAZEM HCL 25 MG/5ML IV SOLN
10.0000 mg | Freq: Once | INTRAVENOUS | Status: AC
  Administered 2019-08-20: 10 mg via INTRAVENOUS
  Filled 2019-08-20: qty 5

## 2019-08-20 MED ORDER — ONDANSETRON HCL 4 MG/2ML IJ SOLN
4.0000 mg | Freq: Four times a day (QID) | INTRAMUSCULAR | Status: DC | PRN
  Administered 2019-08-22: 4 mg via INTRAVENOUS

## 2019-08-20 MED ORDER — SODIUM CHLORIDE 0.9% FLUSH: 3.0000 mL | Freq: Once | INTRAVENOUS | Status: DC

## 2019-08-20 MED ORDER — HEPARIN (PORCINE) 25000 UT/250ML-% IV SOLN
1300.0000 [IU]/h | INTRAVENOUS | Status: DC
  Administered 2019-08-20: 16:00:00 1300 [IU]/h via INTRAVENOUS
  Filled 2019-08-20 (×2): qty 250

## 2019-08-20 MED ORDER — DILTIAZEM HCL 25 MG/5ML IV SOLN
20.0000 mg | Freq: Once | INTRAVENOUS | Status: AC
  Administered 2019-08-20: 12:00:00 20 mg via INTRAVENOUS
  Filled 2019-08-20: qty 5

## 2019-08-20 NOTE — ED Provider Notes (Signed)
[facility] Emergency Department Provider Note MRN:  [id number]  Arrival date & time: 08/20/19     Chief Complaint   Shortness of Breath and Tachycardia   History of Present Illness   Cameron Arroyo is a 68 y.o. year-old male with no pertinent past medical history presenting to the ED with chief complaint of shortness of breath and tachycardia.  Patient has "felt like crap" for the past 1 to 2 months.  Endorsing increased dyspnea on exertion and lightheadedness while ambulating.  Denies fever, no cough, no sick contacts.  Denies chest pain.  Endorsing some intermittent abdominal bloating and/or discomfort for the past 1 to 2 months.  Endorsing an unintentional 30 pound weight loss over the past year.  Denies blood in the stool or black stool.  No vomiting.  Symptoms constant, no exacerbating relieving factors.  Here today because he is tired of feeling this way.  Review of Systems  A complete 10 system review of systems was obtained and all systems are negative except as noted in the HPI and PMH.   Patient's Health History    Past Medical History:  Diagnosis Date  . Asthma    as child    Past Surgical History:  Procedure Laterality Date  . ANTERIOR CERVICAL DECOMP/DISCECTOMY FUSION N/A 03/01/2013   Procedure: Cervical Four-Five Cervical Five-Six Cervical Six-Seven anterior cervical decompression with fusion plating and bonegraft;  Surgeon: Hosie Spangle, MD;  Location: [place] [facility];  Service: Neurosurgery;  Laterality: N/A;  ANTERIOR CERVICAL DECOMPRESSION/DISCECTOMY FUSION 3 LEVELS    Family History  Problem Relation Age of Onset  . Cirrhosis Mother   . Cancer Father     Social History   Socioeconomic History  . Marital status: Married    Spouse name: Not on file  . Number of children: Not on file  . Years of education: Not on file  . Highest education level: Not on file  Occupational History  . Not on file  Social Needs  . Financial resource  strain: Not on file  . Food insecurity    Worry: Not on file    Inability: Not on file  . Transportation needs    Medical: Not on file    Non-medical: Not on file  Tobacco Use  . Smoking status: Former Smoker    Quit date: 11/29/1992    Years since quitting: 26.7  . Smokeless tobacco: Never Used  Substance and Sexual Activity  . Alcohol use: No  . Drug use: Never  . Sexual activity: Not on file  Lifestyle  . Physical activity    Days per week: Not on file    Minutes per session: Not on file  . Stress: Not on file  Relationships  . Social Herbalist on phone: Not on file    Gets together: Not on file    Attends religious service: Not on file    Active member of club or organization: Not on file    Attends meetings of clubs or organizations: Not on file    Relationship status: Not on file  . Intimate partner violence    Fear of current or ex partner: Not on file    Emotionally abused: Not on file    Physically abused: Not on file    Forced sexual activity: Not on file  Other Topics Concern  . Not on file  Social History Narrative  . Not on file     Physical Exam  Vital Signs and Nursing Notes reviewed Vitals:   08/20/19 1530 08/20/19 1600  BP: 112/85 111/80  Pulse: (!) 156 (!) 156  Resp: 17 16  Temp:    SpO2: 98% 98%    CONSTITUTIONAL: Chronically ill-appearing, NAD NEURO:  Alert and oriented x 3, no focal deficits EYES:  eyes equal and reactive ENT/NECK:  no LAD, no JVD CARDIO: Tachycardic rate, well-perfused, normal S1 and S2 PULM:  CTAB no wheezing or rhonchi GI/GU:  normal bowel sounds, non-distended, non-tender MSK/SPINE:  No gross deformities, no edema SKIN:  no rash, atraumatic PSYCH:  Appropriate speech and behavior  Diagnostic and Interventional Summary    EKG Interpretation  Date/Time:  Monday August 20 2019 11:29:13 EDT Ventricular Rate:  156 PR Interval:  80 QRS Duration: 96 QT Interval:  316 QTC Calculation: 509 R Axis:   77  Text Interpretation:  SVT, aflutter? ST & T wave abnormality, consider inferior ischemia ST & T wave abnormality, consider anterior ischemia Abnormal ECG Confirmed by Sherwood Gambler [phone]) on 08/20/2019 11:33:23 AM      Labs Reviewed  BASIC METABOLIC PANEL - Abnormal; Notable for the following components:      Result Value   Glucose, Bld 430 (*)    All other components within normal limits  CBC - Abnormal; Notable for the following components:   WBC 12.7 (*)    All other components within normal limits  MAGNESIUM - Abnormal; Notable for the following components:   Magnesium 2.5 (*)    All other components within normal limits  SARS CORONAVIRUS 2 (TAT 6-24 HRS)  HEMOGLOBIN A1C  TSH  T4, FREE  HEPARIN LEVEL (UNFRACTIONATED)  TROPONIN I (HIGH SENSITIVITY)  TROPONIN I (HIGH SENSITIVITY)    DG Chest Port 1 View  Final Result      Medications  sodium chloride flush (NS) 0.9 % injection 3 mL (3 mLs Intravenous Not Given 08/20/19 1221)  diltiazem (CARDIZEM) 100 mg in dextrose 5% 174mL (1 mg/mL) infusion (7 mg/hr Intravenous Rate/Dose Change 08/20/19 1605)  heparin ADULT infusion 100 units/mL (25000 units/218mL sodium chloride 0.45%) (1,300 Units/hr Intravenous New Bag/Given 08/20/19 1559)  diltiazem (CARDIZEM) injection 20 mg (20 mg Intravenous Given 08/20/19 1202)  sodium chloride 0.9 % bolus 1,000 mL (0 mLs Intravenous Stopped 08/20/19 1347)  diltiazem (CARDIZEM) injection 10 mg (10 mg Intravenous Given 08/20/19 1452)  heparin bolus via infusion 4,000 Units (4,000 Units Intravenous Bolus from Bag 08/20/19 1604)     Procedures Critical Care Critical Care Documentation Critical care time provided by me (excluding procedures): 33 minutes  Condition necessitating critical care: Atrial flutter with rapid ventricular response  Components of critical care management: reviewing of prior records, laboratory and imaging interpretation, frequent re-examination and reassessment of vital signs,  administration of IV diltiazem, discussion with consulting services.    ED Course and Medical Decision Making  I have reviewed the triage vital signs and the nursing notes.  Pertinent labs & imaging results that were available during my care of the patient were reviewed by me and considered in my medical decision making (see below for details).  Vague complaints for 1 or 2 months, possibly related to underlying tachyarrhythmia.  EKG seems most consistent with atrial flutter based on the fairly steady rate at 156, regular appearance.  If SVT would expect a more rapid rate.  Patient is normotensive and well perfused.  He has no palpitations or chest pain.  No lymphadenopathy noted on exam, abdomen nontender.  Also considering underlying malignancy given the  unintentional weight loss.  Will provide 1 L IV fluids, 20 mg diltiazem to attempt rate and/or rhythm control.  Laboratory assessment also pending.  May need admission for continued rate control and possible ablation with cardiology, who we may consult for recommendations.  No response to diltiazem, evaluated by cardiology, who recommends inpatient admission to hospital service.  Barth Kirks. [place]onia Small, [place] [email or web address]  Final Clinical Impressions(s) / ED Diagnoses     ICD-10-CM   1. Atrial flutter with rapid ventricular response (HCC)  I48.92   2. DOE (dyspnea on exertion)  R06.09 DG Chest [facility] 1 View    DG Chest [place] 1 View  3. Hyperglycemia  R73.9   4. Unintentional weight loss  R63.4     ED Discharge Orders    None      Discharge Instructions Discussed with and Provided to Patient: Discharge Instructions   None       Maudie Flakes, MD 08/20/19 [phone]

## 2019-08-20 NOTE — Procedures (Signed)
Accurate echo cannot be done with high heart rate.  HR is 157 at this time. Please call echo department if we are to proceed even with high rate. [phone].

## 2019-08-20 NOTE — ED Notes (Signed)
ED TO INPATIENT HANDOFF REPORT  ED Nurse Name and Phone #: [phone]  S Name/Age/Gender Cameron Arroyo 68 y.o. male Room/Bed: [id number]  Code Status   Code Status: Full Code  Home/SNF/Other Home Patient oriented to: self, place, time and situation Is this baseline? Yes   Triage Complete: Triage complete  Chief Complaint ABN EKG/SENT BY UC  Triage Note Pt from UC with abnormal EKG reading, reports sob with other complaints of dry mouth and shortness of breath. He denies any medical care and reports recent loss of weight. NAD at triage. EKG given to EDP [place]. Pt assigned to room STAT.      Allergies No Known Allergies  Level of Care/Admitting Diagnosis ED Disposition    ED Disposition Condition [facility] Area: [place] [100100]  Level of Care: Progressive [102]  I expect the patient will be discharged within 24 hours: No (not a candidate for 5C-Observation unit)  Covid Evaluation: Asymptomatic Screening Protocol (No Symptoms)  Diagnosis: Atrial flutter (HCC) [427.32.ICD-9-CM]  Admitting Physician: Mckinley Jewel [id number]  Attending Physician: Mckinley Jewel [phone]  PT Class (Do Not Modify): Observation [104]  PT Acc Code (Do Not Modify): Observation [10022]       B Medical/Surgery History Past Medical History:  Diagnosis Date  . Asthma    as child   Past Surgical History:  Procedure Laterality Date  . ANTERIOR CERVICAL DECOMP/DISCECTOMY FUSION N/A 03/01/2013   Procedure: Cervical Four-Five Cervical Five-Six Cervical Six-Seven anterior cervical decompression with fusion plating and bonegraft;  Surgeon: Hosie Spangle, MD;  Location: [place];  Service: Neurosurgery;  Laterality: N/A;  ANTERIOR CERVICAL DECOMPRESSION/DISCECTOMY FUSION 3 LEVELS     A IV Location/Drains/Wounds Patient Lines/Drains/Airways Status   Active Line/Drains/Airways    Name:   Placement date:   Placement time:   Site:   Days:    Peripheral IV 08/20/19 Right Wrist   08/20/19    1143    Wrist   less than 1   Peripheral IV 08/20/19 Left Wrist   08/20/19    1556    Wrist   less than 1   Incision 03/01/13 Neck   03/01/13    1524     2363          Intake/Output Last 24 hours No intake or output data in the 24 hours ending 08/20/19 1812  Labs/Imaging Results for orders placed or performed during the hospital encounter of 08/20/19 (from the past 48 hour(s))  Basic metabolic panel     Status: Abnormal   Collection Time: 08/20/19 11:32 AM  Result Value Ref Range   Sodium 137 135 - 145 mmol/L   Potassium 4.9 3.5 - 5.1 mmol/L   Chloride 102 98 - 111 mmol/L   CO2 24 22 - 32 mmol/L   Glucose, Bld 430 (H) 70 - 99 mg/dL   BUN 21 8 - 23 mg/dL   Creatinine, Ser 0.97 0.61 - 1.24 mg/dL   Calcium 9.3 8.9 - 10.3 mg/dL   GFR calc non Af Amer >60 >60 mL/min   GFR calc Af Amer >60 >60 mL/min   Anion gap 11 5 - 15    Comment: Performed at [facility], [place] [address] [place] [address]  CBC     Status: Abnormal   Collection Time: 08/20/19 11:32 AM  Result Value Ref Range   WBC 12.7 (H) 4.0 - 10.5 K/uL   RBC 5.20 4.22 - 5.81  MIL/uL   Hemoglobin 16.9 13.0 - 17.0 g/dL   HCT 49.0 39.0 - 52.0 %   MCV 94.2 80.0 - 100.0 fL   MCH 32.5 26.0 - 34.0 pg   MCHC 34.5 30.0 - 36.0 g/dL   RDW 12.2 11.5 - 15.5 %   Platelets 214 150 - 400 K/uL   nRBC 0.0 0.0 - 0.2 %    Comment: Performed at [facility], [place] [address] [place], [place] [address]  Troponin I (High Sensitivity)     Status: None   Collection Time: 08/20/19 11:32 AM  Result Value Ref Range   Troponin I (High Sensitivity) 14 <18 ng/L    Comment: (NOTE) Elevated high sensitivity troponin I (hsTnI) values and significant  changes across serial measurements may suggest ACS but many other  chronic and acute conditions are known to elevate hsTnI results.  Refer to the "Links" section for chest pain algorithms and additional  guidance. Performed at [facility], [place] [address] [place], [place] [address]   Magnesium     Status: Abnormal   Collection Time: 08/20/19 11:32 AM  Result Value Ref Range   Magnesium 2.5 (H) 1.7 - 2.4 mg/dL    Comment: Performed at [facility] [address] [place], [place] [address]  Hemoglobin A1c     Status: Abnormal   Collection Time: 08/20/19 11:32 AM  Result Value Ref Range   Hgb A1c MFr Bld 11.5 (H) 4.8 - 5.6 %    Comment: (NOTE) Pre diabetes:          5.7%-6.4% Diabetes:              >6.4% Glycemic control for   <7.0% adults with diabetes    Mean Plasma Glucose 283.35 mg/dL    Comment: Performed at [facility] [address], [place], [place] [address]  Troponin I (High Sensitivity)     Status: None   Collection Time: 08/20/19  4:12 PM  Result Value Ref Range   Troponin I (High Sensitivity) 14 <18 ng/L    Comment: (NOTE) Elevated high sensitivity troponin I (hsTnI) values and significant  changes across serial measurements may suggest ACS but many other  chronic and acute conditions are known to elevate hsTnI results.  Refer to the "Links" section for chest pain algorithms and additional  guidance. Performed at [facility], [place] [address], [place], [place] [address]   TSH     Status: None   Collection Time: 08/20/19  4:12 PM  Result Value Ref Range   TSH 1.194 0.350 - 4.500 uIU/mL    Comment: Performed by a 3rd Generation assay with a functional sensitivity of <=0.01 uIU/mL. Performed at [facility], [place] [address] [place], [place] [address]   T4, free     Status: None   Collection Time: 08/20/19  4:12 PM  Result Value Ref Range   Free T4 1.05 0.61 - 1.12 ng/dL    Comment: (NOTE) Biotin ingestion may interfere with free T4 tests. If the results are inconsistent with the TSH level, previous test results, or the clinical presentation, then consider biotin interference. If needed, order repeat testing after stopping biotin. Performed at [facility], [place] [address], [place], [place] [address]    Dg Chest Port 1 View  Result Date: 08/20/2019 CLINICAL DATA:  Shortness of breath on exertion with tachycardia, history of asthma. EXAM: PORTABLE CHEST 1 VIEW COMPARISON:  None.  FINDINGS: The heart size and mediastinal contours are within normal limits. Both lungs are clear. Signs of previous lower cervical spinal fusion are partially imaged in the upper portion of this radiograph. IMPRESSION: No active disease. Electronically Signed   By: Zetta Bills M.D.   On: 08/20/2019 12:27    Pending Labs Unresulted Labs (From admission, onward)    Start     Ordered   08/21/19 0500  CBC  Daily,   R     08/20/19 1535   08/21/19 0500  Comprehensive metabolic panel  Tomorrow morning,   R     08/20/19 1718   08/20/19 2200  Heparin level (unfractionated)  Once-Timed,   STAT     08/20/19 1535   08/20/19 1747  C-peptide  Add-on,   AD     08/20/19 1746   08/20/19 1721  Urine rapid drug screen (hosp performed)  ONCE - STAT,   STAT     08/20/19 1720   08/20/19 1719  Urinalysis, Routine w reflex microscopic  Once,   STAT     08/20/19 1718   08/20/19 1716  HIV antibody (Routine Testing)  Once,   STAT     08/20/19 1718   08/20/19 1151  SARS CORONAVIRUS 2 (TAT 6-24 HRS) Nasopharyngeal Nasopharyngeal Swab  (Asymptomatic/Tier 2 Patients Labs)  Once,   STAT    Question Answer Comment  Is this test for diagnosis or screening Screening   Symptomatic for COVID-19 as defined by CDC No   Hospitalized for COVID-19 No   Admitted to ICU for COVID-19 No   Previously tested for COVID-19 No   Resident in a congregate (group) care setting Unknown   Employed in healthcare setting Unknown      08/20/19 1150          Vitals/Pain Today's Vitals   08/20/19 1615 08/20/19 1630 08/20/19 1645 08/20/19 1700  BP: 101/86 103/81 94/80 95/73   Pulse: (!) 156 (!) 157 (!) 156 (!) 156  Resp: 11 16 16 19   Temp:      TempSrc:      SpO2: 99% 98% 98% 99%  Weight:       Height:        Isolation Precautions No active isolations  Medications Medications  sodium chloride flush (NS) 0.9 % injection 3 mL (3 mLs Intravenous Not Given 08/20/19 1221)  diltiazem (CARDIZEM) 100 mg in dextrose 5% 152mL (1 mg/mL) infusion (9 mg/hr Intravenous Rate/Dose Change 08/20/19 1715)  heparin ADULT infusion 100 units/mL (25000 units/240mL sodium chloride 0.45%) (1,300 Units/hr Intravenous New Bag/Given 08/20/19 1559)  acetaminophen (TYLENOL) tablet 650 mg (has no administration in time range)    Or  acetaminophen (TYLENOL) suppository 650 mg (has no administration in time range)  ondansetron (ZOFRAN) tablet 4 mg (has no administration in time range)    Or  ondansetron (ZOFRAN) injection 4 mg (has no administration in time range)  diltiazem (CARDIZEM) injection 20 mg (20 mg Intravenous Given 08/20/19 1202)  sodium chloride 0.9 % bolus 1,000 mL (0 mLs Intravenous Stopped 08/20/19 1347)  diltiazem (CARDIZEM) injection 10 mg (10 mg Intravenous Given 08/20/19 1452)  heparin bolus via infusion 4,000 Units (4,000 Units Intravenous Bolus from Bag 08/20/19 1604)    Mobility walks     Focused Assessments   R Recommendations: See Admitting Provider Note  Report given to: Additional Notes:

## 2019-08-20 NOTE — ED Triage Notes (Signed)
Pt from UC with abnormal EKG reading, reports sob with other complaints of dry mouth and shortness of breath. He denies any medical care and reports recent loss of weight. NAD at triage. EKG given to EDP Gold[place]. Pt assigned to room STAT.

## 2019-08-20 NOTE — ED Provider Notes (Signed)
Patient presented to the urgent care center for evaluation of increased thirst and urinary frequency.  States he had some weight loss and not feeling well.  Because of his vital signs with tachycardia an EKG was done.  This did show ischemia.  He was therefore transferred to the emergency department for additional evaluation.   Raylene Everts, MD 08/20/19 [phone]

## 2019-08-20 NOTE — Progress Notes (Signed)
ANTICOAGULATION CONSULT NOTE - Initial Consult  Pharmacy Consult for heparin Indication: aflutter  No Known Allergies  Patient Measurements: Height: 6\' 4"  (193 cm) Weight: 198 lb (89.8 kg) IBW/kg (Calculated) : 86.8 Heparin Dosing Weight: 90kg  Vital Signs: Temp: 98.2 F (36.8 C) (09/21 1126) Temp Source: Oral (09/21 1126) BP: 106/84 (09/21 1445) Pulse Rate: 158 (09/21 1400)  Labs: Recent Labs    08/20/19 1132  HGB 16.9  HCT 49.0  PLT 214  CREATININE 0.97  TROPONINIHS 14    Estimated Creatinine Clearance: 90.7 mL/min (by C-G formula based on SCr of 0.97 mg/dL).   Medical History: Past Medical History:  Diagnosis Date  . Asthma    as child   Assessment: 68 year old male admitted from urgent care with increased thirst, urinary frequency and abnormal EKG. Aflutter noted on EKG, IV heparin ordered. No meds prior to admit.   Goal of Therapy:  Heparin level 0.3-0.7 units/ml Monitor platelets by anticoagulation protocol: Yes   Plan:  Give 4000 units bolus x 1 Start heparin infusion at 1300 units/hr Check anti-Xa level in 6 hours and daily while on heparin Continue to monitor H&H and platelets  Erin Hearing PharmD., BCPS Clinical Pharmacist 08/20/2019 3:32 PM

## 2019-08-20 NOTE — ED Triage Notes (Signed)
Pt here for increased thirst and urination x weeks; pt sts weight loss and not feeling well

## 2019-08-20 NOTE — Consult Note (Addendum)
Cardiology Consultation:   Patient ID: Cameron Arroyo MRN: [id number]; DOB: September 23, 1951  Admit date: 08/20/2019 Date of Consult: 08/20/2019  Primary Care Provider: Patient, No Pcp Per Primary Cardiologist: No primary care provider on file. New Dr. Debara Pickett Primary Electrophysiologist:  None    Patient Profile:   Cameron Arroyo is a 68 y.o. male with a hx of cervical disc disease with surgery and asthma    who is being seen today for the evaluation of atrial flutter at the request of Dr. Sedonia Small, pt presented for freq urination.  History of Present Illness:   [name] with above hx of no CAD that pt is aware of.  On no medications has not followed with medical provider.  In addition to freq urination he complained of wt. Loss.  EKG was done and in a flutter at 156.  He has rec'd IV mg+ and dilt 20 mg.  Now to receive another 10 and drip at 5.   Pt has had mild decline with wt loss and muscle mass loss for some time.  About 3 weeks ago he had knee pain and placed on steroids. He came off 6 days ago.  Last Wed he began feeling bd with weakness and no appetite. No fevers or colds.  It progressed with SOB, no energy, and anxiety.   He has started voiding very freq ups numerous times per night.  Pt no aware of rapid HR.    He is frustrated now that he has not had anything done.  Dr. Debara Pickett discussed plan but that glucose is very elevated, steroids could be playing a role in both the glucose and the rapid HR. Unknown how long he has been in.  No chest pain.  EKG:  The EKG was personally reviewed and demonstrates:  Atrial flutter with RVR at 155. ST depression inf lat due to HR.  No old EKG to compare  Telemetry:  Telemetry was personally reviewed and demonstrates:  A flutter with with RVR  HS troponin 14 Na 137 K+ 4.9 glucose 430 Cr 0.97 Mg+ 2.5  WBC 12.7 Hgb 16.9, plts 214  COVID pending PCXR : IMPRESSION: No active disease.   Heart Pathway Score:     Past Medical History:  Diagnosis Date  .  Asthma    as child    Past Surgical History:  Procedure Laterality Date  . ANTERIOR CERVICAL DECOMP/DISCECTOMY FUSION N/A 03/01/2013   Procedure: Cervical Four-Five Cervical Five-Six Cervical Six-Seven anterior cervical decompression with fusion plating and bonegraft;  Surgeon: Hosie Spangle, MD;  Location: [facility];  Service: Neurosurgery;  Laterality: N/A;  ANTERIOR CERVICAL DECOMPRESSION/DISCECTOMY FUSION 3 LEVELS     Home Medications:  Prior to Admission medications   Not on File    Inpatient Medications: Scheduled Meds: . sodium chloride flush  3 mL Intravenous Once   Continuous Infusions: . diltiazem (CARDIZEM) infusion     PRN Meds:   Allergies:   No Known Allergies  Social History:   Social History   Socioeconomic History  . Marital status: Married    Spouse name: Not on file  . Number of children: Not on file  . Years of education: Not on file  . Highest education level: Not on file  Occupational History  . Not on file  Social Needs  . Financial resource strain: Not on file  . Food insecurity    Worry: Not on file    Inability: Not on file  . Transportation needs  Medical: Not on file    Non-medical: Not on file  Tobacco Use  . Smoking status: Former Smoker    Quit date: 11/29/1992    Years since quitting: 26.7  . Smokeless tobacco: Never Used  Substance and Sexual Activity  . Alcohol use: No  . Drug use: Never  . Sexual activity: Not on file  Lifestyle  . Physical activity    Days per week: Not on file    Minutes per session: Not on file  . Stress: Not on file  Relationships  . Social Herbalist on phone: Not on file    Gets together: Not on file    Attends religious service: Not on file    Active member of club or organization: Not on file    Attends meetings of clubs or organizations: Not on file    Relationship status: Not on file  . Intimate partner violence    Fear of current or ex partner: Not on file    Emotionally  abused: Not on file    Physically abused: Not on file    Forced sexual activity: Not on file  Other Topics Concern  . Not on file  Social History Narrative  . Not on file    Family History:    Family History  Problem Relation Age of Onset  . Cirrhosis Mother   . Cancer Father      ROS:  Please see the history of present illness.  General:no colds or fevers, + weight loss Skin:no rashes or ulcers HEENT:no blurred vision, no congestion CV:see HPI PUL:see HPI GI:no diarrhea constipation or melena, no indigestion GU:no hematuria, no dysuria MS:+ knee pain, no claudication Neuro:no syncope, no lightheadedness Endo:no diabetes,though glucose elevated no thyroid disease  All other ROS reviewed and negative.     Physical Exam/Data:   Vitals:   08/20/19 1345 08/20/19 1400 08/20/19 1430 08/20/19 1445  BP: 106/82 106/72 104/84 106/84  Pulse: (!) 161 (!) 158    Resp: 17 19 17 17   Temp:      TempSrc:      SpO2: 93% 97%     No intake or output data in the 24 hours ending 08/20/19 1516 Last 3 Weights 02/23/2013  Weight (lbs) 226 lb 13.7 oz  Weight (kg) 102.9 kg     There is no height or weight on file to calculate BMI.  Per Dr. Debara Pickett General:  Frail male, in no acute distress HEENT: normal Lymph: no adenopathy Neck: no JVD Endocrine:  No thryomegaly Vascular: No carotid bruits; pedal pulses 2+ bilaterally  Cardiac:  normal S1, S2; RRR; no murmur but very rapid difficult to hear much Lungs:  clear to auscultation bilaterally, no wheezing, rhonchi or rales  Abd: soft, nontender, no hepatomegaly  Ext: 1+ sockline edema Musculoskeletal:  No deformities, BUE and BLE strength normal and equal Skin: warm and dry  Neuro:  CNs 2-12 intact, no focal abnormalities noted Psych:  Normal affect    Relevant CV Studies: none  Laboratory Data:  High Sensitivity Troponin:   Recent Labs  Lab 08/20/19 1132  TROPONINIHS 14     Chemistry Recent Labs  Lab 08/20/19 1132  NA  137  K 4.9  CL 102  CO2 24  GLUCOSE 430*  BUN 21  CREATININE 0.97  CALCIUM 9.3  GFRNONAA >60  GFRAA >60  ANIONGAP 11    No results for input(s): PROT, ALBUMIN, AST, ALT, ALKPHOS, BILITOT in the last 168 hours. Hematology  Recent Labs  Lab 08/20/19 1132  WBC 12.7*  RBC 5.20  HGB 16.9  HCT 49.0  MCV 94.2  MCH 32.5  MCHC 34.5  RDW 12.2  PLT 214   BNPNo results for input(s): BNP, PROBNP in the last 168 hours.  DDimer No results for input(s): DDIMER in the last 168 hours.   Radiology/Studies:  Dg Chest Port 1 View  Result Date: 08/20/2019 CLINICAL DATA:  Shortness of breath on exertion with tachycardia, history of asthma. EXAM: PORTABLE CHEST 1 VIEW COMPARISON:  None. FINDINGS: The heart size and mediastinal contours are within normal limits. Both lungs are clear. Signs of previous lower cervical spinal fusion are partially imaged in the upper portion of this radiograph. IMPRESSION: No active disease. Electronically Signed   By: Zetta Bills M.D.   On: 08/20/2019 12:27    Assessment and Plan:   1. A flutter with RVR giving dilt drip and will add IV heparin.  If does not convert may need TEE DCCV.  Pt and wife aware. Ok to eat now.  Initial troponin neg, will follow with second.  CXR without fluid.  COVID pending.  Check TSH, echo Dr. Debara Pickett has seen.  2. Hyperglycemia with decreased appetite and wt loss.  + freq urination, weakness.  Not sure all related to glucose  Check hgb A1c. Would ask hospitalist to admit and manage wt loss and glucose.       For questions or updates, please contact [place] Please consult [email or web address] for contact info under     Signed, Cecilie Kicks, NP  08/20/2019 3:16 PM

## 2019-08-20 NOTE — H&P (Signed)
History and Physical    Cameron Arroyo [id number] DOB: [birth year] DOA: 08/20/2019  PCP: Patient, No Pcp Per  Patient coming from: Home I have personally briefly reviewed patient's old medical records in [place]  Chief Complaint: "Feeling like crap"  HPI: Cameron Arroyo is a 68 y.o. male with no significant past medical history came to the emergency department with multiple problems.  He reports that he feels like crap, polyuria, dry mouth, decreased appetite, weight loss, shortness of breath, anxiety, no energy since 2 to 3 weeks.  He went to [facility] health urgent care initially where he was found to have atrial flutter with rapid response and was advised to go to the emergency department for further evaluation and management.  He reports shortness of breath but denies association with chest pain, palpitation, headache, blurry vision, lightheadedness, dizziness, nausea, vomiting, epigastric pain, diarrhea, constipation, fever, chills, decreased appetite, night sweats, family history of thyroid issues, colon cancer, hematemesis, melena, over-the-counter use of NSAIDs.  Reports unintentional weight loss of 30 pounds in the last 1 year.  He is a former smoker however denies alcohol, illicit drug use.  He was prescribed steroids by his PCP recently for left knee pain.  Patient reports that his left knee pain is better.  ED Course: Patient was found to have rapid narrow complex tachycardia suggestive of atrial flutter.  Hyperglycemia of blood glucose 430, and leukocytosis of 12.7.  Initial troponin x1-.  Magnesium level elevated at 2.5.  Chest x-ray came back negative.  COVID-19: Pending.  CBC: H&H is stable.  Kidney function: WNL.[name] Kitchen  Started on IV diltiazem and IV heparin.  Transthoracic echo was ordered which was not performed due to patient's tachycardia.  Review of Systems: As per HPI otherwise negative.    Past Medical History:  Diagnosis Date  . Asthma    as child    Past Surgical  History:  Procedure Laterality Date  . ANTERIOR CERVICAL DECOMP/DISCECTOMY FUSION N/A 03/01/2013   Procedure: Cervical Four-Five Cervical Five-Six Cervical Six-Seven anterior cervical decompression with fusion plating and bonegraft;  Surgeon: Hosie Spangle, MD;  Location: [facility];  Service: Neurosurgery;  Laterality: N/A;  ANTERIOR CERVICAL DECOMPRESSION/DISCECTOMY FUSION 3 LEVELS     reports that he quit smoking about 26 years ago. He has never used smokeless tobacco. He reports that he does not drink alcohol or use drugs.  No Known Allergies  Family History  Problem Relation Age of Onset  . Cirrhosis Mother   . Cancer Father     Prior to Admission medications   Not on File    Physical Exam: Vitals:   08/20/19 1615 08/20/19 1630 08/20/19 1645 08/20/19 1700  BP: 101/86 103/81 94/80 95/73   Pulse: (!) 156 (!) 157 (!) 156 (!) 156  Resp: 11 16 16 19   Temp:      TempSrc:      SpO2: 99% 98% 98% 99%  Weight:      Height:        Constitutional: NAD, calm, comfortable Vitals:   08/20/19 1615 08/20/19 1630 08/20/19 1645 08/20/19 1700  BP: 101/86 103/81 94/80 95/73   Pulse: (!) 156 (!) 157 (!) 156 (!) 156  Resp: 11 16 16 19   Temp:      TempSrc:      SpO2: 99% 98% 98% 99%  Weight:      Height:       Constitutional: Alert and oriented x3, communicating well, not in acute distress.   Eyes: PERRL,  lids and conjunctivae normal ENMT: Mucous membranes are moist. Posterior pharynx clear of any exudate or lesions.Normal dentition.  Neck: normal, supple, no masses, no thyromegaly Respiratory: clear to auscultation bilaterally, no wheezing, no crackles. Normal respiratory effort. No accessory muscle use.  Cardiovascular: Tachycardia, no murmurs / rubs / gallops. No extremity edema. 2+ pedal pulses. No carotid bruits.  Abdomen: no tenderness, no masses palpated. No hepatosplenomegaly. Bowel sounds positive.  Musculoskeletal: no clubbing / cyanosis. No joint deformity upper and  lower extremities. Good ROM, no contractures. Normal muscle tone.  Skin: no rashes, lesions, ulcers. No induration Neurologic: CN 2-12 grossly intact. Sensation intact, DTR normal. Strength 5/5 in all 4.  Psychiatric: Normal judgment and insight. Alert and oriented x 3. Normal mood.    Labs on Admission: I have personally reviewed following labs and imaging studies  CBC: Recent Labs  Lab 08/20/19 1132  WBC 12.7*  HGB 16.9  HCT 49.0  MCV 94.2  PLT [id number]   Basic Metabolic Panel: Recent Labs  Lab 08/20/19 1132  NA 137  K 4.9  CL 102  CO2 24  GLUCOSE 430*  BUN 21  CREATININE 0.97  CALCIUM 9.3  MG 2.5*   GFR: Estimated Creatinine Clearance: 90.7 mL/min (by C-G formula based on SCr of 0.97 mg/dL). Liver Function Tests: No results for input(s): AST, ALT, ALKPHOS, BILITOT, PROT, ALBUMIN in the last 168 hours. No results for input(s): LIPASE, AMYLASE in the last 168 hours. No results for input(s): AMMONIA in the last 168 hours. Coagulation Profile: No results for input(s): INR, PROTIME in the last 168 hours. Cardiac Enzymes: No results for input(s): CKTOTAL, CKMB, CKMBINDEX, TROPONINI in the last 168 hours. BNP (last 3 results) No results for input(s): PROBNP in the last 8760 hours. HbA1C: No results for input(s): HGBA1C in the last 72 hours. CBG: No results for input(s): GLUCAP in the last 168 hours. Lipid Profile: No results for input(s): CHOL, HDL, LDLCALC, TRIG, CHOLHDL, LDLDIRECT in the last 72 hours. Thyroid Function Tests: Recent Labs    08/20/19 1612  TSH 1.194  FREET4 1.05   Anemia Panel: No results for input(s): VITAMINB12, FOLATE, FERRITIN, TIBC, IRON, RETICCTPCT in the last 72 hours. Urine analysis: No results found for: COLORURINE, APPEARANCEUR, LABSPEC, PHURINE, GLUCOSEU, HGBUR, BILIRUBINUR, KETONESUR, PROTEINUR, UROBILINOGEN, NITRITE, LEUKOCYTESUR  Radiological Exams on Admission: Dg Chest Port 1 View  Result Date: 08/20/2019 CLINICAL DATA:   Shortness of breath on exertion with tachycardia, history of asthma. EXAM: PORTABLE CHEST 1 VIEW COMPARISON:  None. FINDINGS: The heart size and mediastinal contours are within normal limits. Both lungs are clear. Signs of previous lower cervical spinal fusion are partially imaged in the upper portion of this radiograph. IMPRESSION: No active disease. Electronically Signed   By: Zetta Bills M.D.   On: 08/20/2019 12:27    EKG: Narrow complex atrial tachycardia consistent with atrial flutter.  Assessment/Plan Principal Problem:   Atrial flutter (HCC) Active Problems:   Leukocytosis   Hyperglycemia   Hypermagnesemia   Unintentional weight loss   Polyuria    Atrial flutter: -Patient presented with decreased appetite, weight loss, anxiety, shortness of breath, polyuria, fatigue. -His symptoms could be secondary to hyperthyroidism?  Versus new onset of diabetes? -Patient heart rates more than 150s.  Reviewed EKG.  Chest x-ray negative.  Troponin x1-.  Will trend. -Unknown etiology.  Thyroid function study has been ordered and is pending. -COVID-19: Pending. -On cardiac telemetry. -Cardiology has been consulted by EDP-appreciate help -Continue IV Cardizem for rate control  and heparin drip-may change to Eliquis.  May need TEE cardioversion if he does not convert. -Transthoracic echo ordered-not done due to patient's tachycardia  Hyperglycemia: Blood sugar more than 400 upon arrival. -Patient presented with polyuria, dry mouth, weight loss, fatigue -Could be secondary to new onset diabetes mellitus -On continuous blood glucose monitoring.  Will check C-peptide level.  Leukocytosis: -Likely secondary to recent steroid use.  Patient is afebrile, chest x-ray is negative -No indication of antibiotic.  Unintentional weight loss: -30 pounds in 1 year. -Could be secondary to new onset diabetes versus thyroid problems? -TSH, T3, T4, A1c ordered and is pending, we will also check HIV.   Hypermagnesemia: -Monitor magnesium level. -On telemetry.  DVT prophylaxis: On heparin drip  code Status: Full code Family Communication: Wife present at bedside.  Plan of care discussed with patient and his wife in length and they verbalized understanding and agreed with it. Disposition Plan: Likely home in 1 to 2 days Consults called: Cardiology Dr. Debara Pickett. admission status: Observation   Mckinley Jewel MD [facility] Pager [phone]  If 7PM-7AM, please contact night-coverage [email or web address] Password [facility]  08/20/2019, 5:30 PM

## 2019-08-21 ENCOUNTER — Observation Stay (HOSPITAL_COMMUNITY): Payer: 59

## 2019-08-21 DIAGNOSIS — R634 Abnormal weight loss: Secondary | ICD-10-CM | POA: Diagnosis not present

## 2019-08-21 DIAGNOSIS — Z20828 Contact with and (suspected) exposure to other viral communicable diseases: Secondary | ICD-10-CM | POA: Diagnosis present

## 2019-08-21 DIAGNOSIS — I9589 Other hypotension: Secondary | ICD-10-CM | POA: Diagnosis present

## 2019-08-21 DIAGNOSIS — R0609 Other forms of dyspnea: Secondary | ICD-10-CM

## 2019-08-21 DIAGNOSIS — E861 Hypovolemia: Secondary | ICD-10-CM | POA: Diagnosis present

## 2019-08-21 DIAGNOSIS — M25562 Pain in left knee: Secondary | ICD-10-CM | POA: Diagnosis present

## 2019-08-21 DIAGNOSIS — E1165 Type 2 diabetes mellitus with hyperglycemia: Secondary | ICD-10-CM | POA: Diagnosis present

## 2019-08-21 DIAGNOSIS — E119 Type 2 diabetes mellitus without complications: Secondary | ICD-10-CM | POA: Diagnosis present

## 2019-08-21 DIAGNOSIS — I4892 Unspecified atrial flutter: Secondary | ICD-10-CM | POA: Diagnosis not present

## 2019-08-21 DIAGNOSIS — E869 Volume depletion, unspecified: Secondary | ICD-10-CM

## 2019-08-21 DIAGNOSIS — Z87891 Personal history of nicotine dependence: Secondary | ICD-10-CM | POA: Diagnosis not present

## 2019-08-21 DIAGNOSIS — T380X5A Adverse effect of glucocorticoids and synthetic analogues, initial encounter: Secondary | ICD-10-CM | POA: Diagnosis present

## 2019-08-21 DIAGNOSIS — D72829 Elevated white blood cell count, unspecified: Secondary | ICD-10-CM | POA: Diagnosis present

## 2019-08-21 DIAGNOSIS — E0865 Diabetes mellitus due to underlying condition with hyperglycemia: Secondary | ICD-10-CM

## 2019-08-21 DIAGNOSIS — Z981 Arthrodesis status: Secondary | ICD-10-CM | POA: Diagnosis not present

## 2019-08-21 DIAGNOSIS — I483 Typical atrial flutter: Secondary | ICD-10-CM | POA: Diagnosis present

## 2019-08-21 LAB — HIV ANTIBODY (ROUTINE TESTING W REFLEX): HIV Screen 4th Generation wRfx: NONREACTIVE

## 2019-08-21 LAB — COMPREHENSIVE METABOLIC PANEL
ALT: 21 U/L (ref 0–44)
AST: 15 U/L (ref 15–41)
Albumin: 3 g/dL — ABNORMAL LOW (ref 3.5–5.0)
Alkaline Phosphatase: 66 U/L (ref 38–126)
Anion gap: 8 (ref 5–15)
BUN: 19 mg/dL (ref 8–23)
CO2: 23 mmol/L (ref 22–32)
Calcium: 7.9 mg/dL — ABNORMAL LOW (ref 8.9–10.3)
Chloride: 105 mmol/L (ref 98–111)
Creatinine, Ser: 0.9 mg/dL (ref 0.61–1.24)
GFR calc Af Amer: >60 mL/min (ref >60)
GFR calc non Af Amer: >60 mL/min (ref >60)
Glucose, Bld: 340 mg/dL — ABNORMAL HIGH (ref 70–99)
Potassium: 4 mmol/L (ref 3.5–5.1)
Sodium: 136 mmol/L (ref 135–145)
Total Bilirubin: 1.3 mg/dL — ABNORMAL HIGH (ref 0.3–1.2)
Total Protein: 5.2 g/dL — ABNORMAL LOW (ref 6.5–8.1)

## 2019-08-21 LAB — HEPARIN LEVEL (UNFRACTIONATED)
Heparin Unfractionated: 0.52 IU/mL (ref 0.30–0.70)
Heparin Unfractionated: 0.58 IU/mL (ref 0.30–0.70)

## 2019-08-21 LAB — CBC
HCT: 41.4 % (ref 39.0–52.0)
Hemoglobin: 14 g/dL (ref 13.0–17.0)
MCH: 32.5 pg (ref 26.0–34.0)
MCHC: 33.8 g/dL (ref 30.0–36.0)
MCV: 96.1 fL (ref 80.0–100.0)
Platelets: 146 10*3/uL — ABNORMAL LOW (ref 150–400)
RBC: 4.31 MIL/uL (ref 4.22–5.81)
RDW: 12.3 % (ref 11.5–15.5)
WBC: 9.3 10*3/uL (ref 4.0–10.5)
nRBC: 0 % (ref 0.0–0.2)

## 2019-08-21 LAB — GLUCOSE, CAPILLARY
Glucose-Capillary: 248 mg/dL — ABNORMAL HIGH (ref 70–99)
Glucose-Capillary: 236 mg/dL — ABNORMAL HIGH (ref 70–99)
Glucose-Capillary: 246 mg/dL — ABNORMAL HIGH (ref 70–99)

## 2019-08-21 LAB — CBG MONITORING, ED
Glucose-Capillary: 302 mg/dL — ABNORMAL HIGH (ref 70–99)
Glucose-Capillary: 282 mg/dL — ABNORMAL HIGH (ref 70–99)

## 2019-08-21 MED ORDER — SODIUM CHLORIDE 0.9 % IV SOLN: 250.0000 mL | INTRAVENOUS | Status: DC

## 2019-08-21 MED ORDER — SODIUM CHLORIDE 0.9 % IV SOLN: INTRAVENOUS | Status: DC

## 2019-08-21 MED ORDER — SODIUM CHLORIDE 0.9% FLUSH: 3.0000 mL | INTRAVENOUS | Status: DC | PRN

## 2019-08-21 MED ORDER — METOPROLOL TARTRATE 12.5 MG HALF TABLET
12.5000 mg | ORAL_TABLET | Freq: Two times a day (BID) | ORAL | Status: DC
  Administered 2019-08-21 – 2019-08-23 (×5): 12.5 mg via ORAL
  Filled 2019-08-21 (×5): qty 1

## 2019-08-21 MED ORDER — SODIUM CHLORIDE 0.9 % IV BOLUS
1500.0000 mL | Freq: Once | INTRAVENOUS | Status: AC
  Administered 2019-08-21: 09:00:00 1500 mL via INTRAVENOUS

## 2019-08-21 MED ORDER — SODIUM CHLORIDE 0.9 % IV SOLN
INTRAVENOUS | Status: DC
  Administered 2019-08-21: 12:00:00 via INTRAVENOUS

## 2019-08-21 MED ORDER — INSULIN ASPART 100 UNIT/ML ~~LOC~~ SOLN
0.0000 [IU] | Freq: Every day | SUBCUTANEOUS | Status: DC
  Administered 2019-08-21: 2 [IU] via SUBCUTANEOUS
  Administered 2019-08-22: 3 [IU] via SUBCUTANEOUS

## 2019-08-21 MED ORDER — SODIUM CHLORIDE 0.9% FLUSH
3.0000 mL | Freq: Two times a day (BID) | INTRAVENOUS | Status: DC
  Administered 2019-08-21 – 2019-08-22 (×2): 3 mL via INTRAVENOUS

## 2019-08-21 MED ORDER — APIXABAN 5 MG PO TABS
5.0000 mg | ORAL_TABLET | Freq: Two times a day (BID) | ORAL | Status: DC
  Administered 2019-08-21 – 2019-08-23 (×5): 5 mg via ORAL
  Filled 2019-08-21 (×5): qty 1

## 2019-08-21 MED ORDER — INSULIN ASPART 100 UNIT/ML ~~LOC~~ SOLN
0.0000 [IU] | Freq: Three times a day (TID) | SUBCUTANEOUS | Status: DC
  Administered 2019-08-21 – 2019-08-22 (×3): 5 [IU] via SUBCUTANEOUS
  Administered 2019-08-22: 17:00:00 3 [IU] via SUBCUTANEOUS
  Administered 2019-08-22 – 2019-08-23 (×2): 2 [IU] via SUBCUTANEOUS

## 2019-08-21 MED ORDER — LIVING WELL WITH DIABETES BOOK
Freq: Once | Status: AC
  Administered 2019-08-21: 15:00:00
  Filled 2019-08-21: qty 1

## 2019-08-21 MED ORDER — INSULIN GLARGINE 100 UNIT/ML ~~LOC~~ SOLN
20.0000 [IU] | Freq: Every day | SUBCUTANEOUS | Status: DC
  Administered 2019-08-21 – 2019-08-23 (×3): 20 [IU] via SUBCUTANEOUS
  Filled 2019-08-21 (×3): qty 0.2

## 2019-08-21 MED ORDER — OFF THE BEAT BOOK
Freq: Once | Status: AC
  Administered 2019-08-21: 15:00:00
  Filled 2019-08-21: qty 1

## 2019-08-21 MED ORDER — INSULIN ASPART 100 UNIT/ML ~~LOC~~ SOLN: 0.0000 [IU] | Freq: Three times a day (TID) | SUBCUTANEOUS | Status: DC

## 2019-08-21 MED ORDER — SODIUM CHLORIDE 0.9 % IV BOLUS
1000.0000 mL | Freq: Once | INTRAVENOUS | Status: AC
  Administered 2019-08-21: 01:00:00 1000 mL via INTRAVENOUS

## 2019-08-21 NOTE — ED Notes (Signed)
PAGED DR Paticia Stack TO RN MIKE--Orie [place]end[place]

## 2019-08-21 NOTE — H&P (View-Only) (Signed)
DAILY PROGRESS NOTE   Patient Name: Cameron Arroyo Date of Encounter: 08/21/2019 Cardiologist: Pixie Casino, MD  Chief Complaint   Feels better today  Patient Profile   DARREN LEINEN is a 68 y.o. male with a hx of cervical disc disease with surgery and asthma  who is being seen today for the evaluation of atrial flutter at the request of Dr. Sedonia Small, pt presented for freq urination  Subjective   Remains in atrial flutter with RVR around 150. Had become hypotensive on cardizem with no real effect on HR. On IV heparin with plans for possible TEE/DCCV - troponin has been negative. TSH is normal. HBA1c is 11.5.   Objective   Vitals:   08/21/19 0745 08/21/19 0800 08/21/19 0830 08/21/19 0900  BP: 96/73 93/66 99/72  90/67  Pulse: (!) 148 (!) 147 (!) 148 (!) 148  Resp: 14 15 13 16   Temp:      TempSrc:      SpO2: 100% 97% 97% 97%  Weight:      Height:        Intake/Output Summary (Last 24 hours) at 08/21/2019 1011 Last data filed at 08/21/2019 0751 Gross per 24 hour  Intake 1211.49 ml  Output --  Net 1211.49 ml   Filed Weights   08/20/19 1500  Weight: 89.8 kg    Physical Exam   General appearance: alert and no distress Neck: no carotid bruit, no JVD and thyroid not enlarged, symmetric, no tenderness/mass/nodules Lungs: clear to auscultation bilaterally Heart: regular tachycardia Abdomen: soft, non-tender; bowel sounds normal; no masses,  no organomegaly Extremities: extremities normal, atraumatic, no cyanosis or edema Pulses: 2+ and symmetric Skin: Skin color, texture, turgor normal. No rashes or lesions Neurologic: Grossly normal Psych: Pleasant  Inpatient Medications    Scheduled Meds:  insulin aspart  0-15 Units Subcutaneous TID WC   insulin aspart  0-5 Units Subcutaneous QHS   insulin glargine  20 Units Subcutaneous QHS   sodium chloride flush  3 mL Intravenous Once   sodium chloride flush  3 mL Intravenous Q12H    Continuous Infusions:  sodium  chloride     [START ON 08/22/2019] sodium chloride     sodium chloride     diltiazem (CARDIZEM) infusion Stopped (08/21/19 0751)   heparin 1,300 Units/hr (08/20/19 1559)    PRN Meds: acetaminophen **OR** acetaminophen, ondansetron **OR** ondansetron (ZOFRAN) IV, sodium chloride flush   Labs   Results for orders placed or performed during the hospital encounter of 08/20/19 (from the past 48 hour(s))  Basic metabolic panel     Status: Abnormal   Collection Time: 08/20/19 11:32 AM  Result Value Ref Range   Sodium 137 135 - 145 mmol/L   Potassium 4.9 3.5 - 5.1 mmol/L   Chloride 102 98 - 111 mmol/L   CO2 24 22 - 32 mmol/L   Glucose, Bld 430 (H) 70 - 99 mg/dL   BUN 21 8 - 23 mg/dL   Creatinine, Ser 0.97 0.61 - 1.24 mg/dL   Calcium 9.3 8.9 - 10.3 mg/dL   GFR calc non Af Amer >60 >60 mL/min   GFR calc Af Amer >60 >60 mL/min   Anion gap 11 5 - 15    Comment: Performed at [facility], [place] [address] [place], [place] [address]  CBC     Status: Abnormal   Collection Time: 08/20/19 11:32 AM  Result Value Ref Range   WBC 12.7 (H) 4.0 - 10.5 K/uL   RBC  5.20 4.22 - 5.81 MIL/uL   Hemoglobin 16.9 13.0 - 17.0 g/dL   HCT 49.0 39.0 - 52.0 %   MCV 94.2 80.0 - 100.0 fL   MCH 32.5 26.0 - 34.0 pg   MCHC 34.5 30.0 - 36.0 g/dL   RDW 12.2 11.5 - 15.5 %   Platelets 214 150 - 400 K/uL   nRBC 0.0 0.0 - 0.2 %    Comment: Performed at [facility], [place] [address] [place], [place] [address]  Troponin I (High Sensitivity)     Status: None   Collection Time: 08/20/19 11:32 AM  Result Value Ref Range   Troponin I (High Sensitivity) 14 <18 ng/L    Comment: (NOTE) Elevated high sensitivity troponin I (hsTnI) values and significant  changes across serial measurements may suggest ACS but many other  chronic and acute conditions are known to elevate hsTnI results.  Refer to the "Links" section for chest pain algorithms and additional  guidance. Performed at [facility], [place] [address], [place], [place] [address]   Magnesium     Status: Abnormal   Collection Time: 08/20/19 11:32 AM  Result Value Ref Range   Magnesium 2.5 (H) 1.7 - 2.4 mg/dL    Comment: Performed at [facility] [address], [place], [place] [address]  Hemoglobin A1c     Status: Abnormal   Collection Time: 08/20/19 11:32 AM  Result Value Ref Range   Hgb A1c MFr Bld 11.5 (H) 4.8 - 5.6 %    Comment: (NOTE) Pre diabetes:          5.7%-6.4% Diabetes:              >6.4% Glycemic control for   <7.0% adults with diabetes    Mean Plasma Glucose 283.35 mg/dL    Comment: Performed at [facility] [address] [place], [place] [address]  SARS CORONAVIRUS 2 (TAT 6-24 HRS) Nasopharyngeal Nasopharyngeal Swab     Status: None   Collection Time: 08/20/19 11:51 AM   Specimen: Nasopharyngeal Swab  Result Value Ref Range   SARS Coronavirus 2 NEGATIVE NEGATIVE    Comment: (NOTE) SARS-CoV-2 target nucleic acids are NOT DETECTED. The SARS-CoV-2 RNA is generally detectable in upper and lower respiratory specimens during the acute phase of infection. Negative results do not preclude SARS-CoV-2 infection, do not rule out co-infections with other pathogens, and should not be used as the sole basis for treatment or other patient management decisions. Negative results must be combined with clinical observations, patient history, and epidemiological information. The expected result is Negative. Fact Sheet for Patients: SugarRoll.be Fact Sheet for Healthcare Providers: https://www.woods-mathews.com/ This test is not yet approved or cleared by the [place] FDA and  has been authorized for detection and/or diagnosis of SARS-CoV-2 by FDA under an Emergency Use Authorization (EUA). This EUA will remain  in effect (meaning this test can be used) for the duration of the COVID-19 declaration under Section 56 4(b)(1) of the Act, 21 U.S.C. section  360bbb-3(b)(1), unless the authorization is terminated or revoked sooner. Performed at [facility], [place] [address] [place], [place] [address]   Troponin I (High Sensitivity)     Status: None   Collection Time: 08/20/19  4:12 PM  Result Value Ref Range   Troponin I (High Sensitivity) 14 <18 ng/L    Comment: (NOTE) Elevated high sensitivity troponin I (hsTnI) values and significant  changes across serial measurements may suggest ACS but  many other  chronic and acute conditions are known to elevate hsTnI results.  Refer to the "Links" section for chest pain algorithms and additional  guidance. Performed at [facility], [place] [address], [place], [place] [address]   TSH     Status: None   Collection Time: 08/20/19  4:12 PM  Result Value Ref Range   TSH 1.194 0.350 - 4.500 uIU/mL    Comment: Performed by a 3rd Generation assay with a functional sensitivity of <=0.01 uIU/mL. Performed at [facility], [place] [address], [place], [place] [address]   T4, free     Status: None   Collection Time: 08/20/19  4:12 PM  Result Value Ref Range   Free T4 1.05 0.61 - 1.12 ng/dL    Comment: (NOTE) Biotin ingestion may interfere with free T4 tests. If the results are inconsistent with the TSH level, previous test results, or the clinical presentation, then consider biotin interference. If needed, order repeat testing after stopping biotin. Performed at [facility], [place] [address], [place], [place] [address]   Urinalysis, Routine w reflex microscopic     Status: Abnormal   Collection Time: 08/20/19  9:00 PM  Result Value Ref Range   Color, Urine YELLOW YELLOW   APPearance CLEAR CLEAR   Specific Gravity, Urine 1.036 (H) 1.005 - 1.030   pH 5.0 5.0 - 8.0   Glucose, UA >=500 (A) NEGATIVE mg/dL   Hgb urine dipstick NEGATIVE NEGATIVE   Bilirubin Urine NEGATIVE NEGATIVE   Ketones, ur 80 (A) NEGATIVE mg/dL   Protein, ur NEGATIVE NEGATIVE mg/dL   Nitrite NEGATIVE NEGATIVE    Leukocytes,Ua NEGATIVE NEGATIVE   RBC / HPF 0-5 0 - 5 RBC/hpf   WBC, UA 0-5 0 - 5 WBC/hpf   Bacteria, UA RARE (A) NONE SEEN   Squamous Epithelial / LPF 0-5 0 - 5   Mucus PRESENT     Comment: Performed at [facility], [place] [address] [place], [place] [address]  Urine rapid drug screen (hosp performed)     Status: None   Collection Time: 08/20/19  9:00 PM  Result Value Ref Range   Opiates NONE DETECTED NONE DETECTED   Cocaine NONE DETECTED NONE DETECTED   Benzodiazepines NONE DETECTED NONE DETECTED   Amphetamines NONE DETECTED NONE DETECTED   Tetrahydrocannabinol NONE DETECTED NONE DETECTED   Barbiturates NONE DETECTED NONE DETECTED    Comment: (NOTE) DRUG SCREEN FOR MEDICAL PURPOSES ONLY.  IF CONFIRMATION IS NEEDED FOR ANY PURPOSE, NOTIFY LAB WITHIN 5 DAYS. LOWEST DETECTABLE LIMITS FOR URINE DRUG SCREEN Drug Class                     Cutoff (ng/mL) Amphetamine and metabolites    1000 Barbiturate and metabolites    200 Benzodiazepine                 [id number] Tricyclics and metabolites     300 Opiates and metabolites        300 Cocaine and metabolites        300 THC                            50 Performed at [facility], [place] [address] [place],  [address]   CBG monitoring, ED     Status: Abnormal   Collection Time: 08/20/19 11:40 PM  Result Value Ref Range   Glucose-Capillary 399 (  H) 70 - 99 mg/dL  CBC     Status: Abnormal   Collection Time: 08/21/19  2:04 AM  Result Value Ref Range   WBC 9.3 4.0 - 10.5 K/uL   RBC 4.31 4.22 - 5.81 MIL/uL   Hemoglobin 14.0 13.0 - 17.0 g/dL   HCT 41.4 39.0 - 52.0 %   MCV 96.1 80.0 - 100.0 fL   MCH 32.5 26.0 - 34.0 pg   MCHC 33.8 30.0 - 36.0 g/dL   RDW 12.3 11.5 - 15.5 %   Platelets 146 (L) 150 - 400 K/uL   nRBC 0.0 0.0 - 0.2 %    Comment: Performed at [facility], [place] [address], [place], [place] [address]  Comprehensive metabolic panel     Status: Abnormal   Collection Time: 08/21/19  2:04 AM  Result Value Ref  Range   Sodium 136 135 - 145 mmol/L   Potassium 4.0 3.5 - 5.1 mmol/L    Comment: DELTA CHECK NOTED   Chloride 105 98 - 111 mmol/L   CO2 23 22 - 32 mmol/L   Glucose, Bld 340 (H) 70 - 99 mg/dL   BUN 19 8 - 23 mg/dL   Creatinine, Ser 0.90 0.61 - 1.24 mg/dL   Calcium 7.9 (L) 8.9 - 10.3 mg/dL   Total Protein 5.2 (L) 6.5 - 8.1 g/dL   Albumin 3.0 (L) 3.5 - 5.0 g/dL   AST 15 15 - 41 U/L   ALT 21 0 - 44 U/L   Alkaline Phosphatase 66 38 - 126 U/L   Total Bilirubin 1.3 (H) 0.3 - 1.2 mg/dL   GFR calc non Af Amer >60 >60 mL/min   GFR calc Af Amer >60 >60 mL/min   Anion gap 8 5 - 15    Comment: Performed at [facility], [place] [address], [place], [place] [address]  Heparin level (unfractionated)     Status: None   Collection Time: 08/21/19  2:04 AM  Result Value Ref Range   Heparin Unfractionated 0.52 0.30 - 0.70 IU/mL    Comment: (NOTE) If heparin results are below expected values, and patient dosage has  been confirmed, suggest follow up testing of antithrombin III levels. Performed at [facility], [place] [address], [place], [place] [address]   CBG monitoring, ED     Status: Abnormal   Collection Time: 08/21/19  6:02 AM  Result Value Ref Range   Glucose-Capillary 302 (H) 70 - 99 mg/dL  CBG monitoring, ED     Status: Abnormal   Collection Time: 08/21/19  8:15 AM  Result Value Ref Range   Glucose-Capillary 282 (H) 70 - 99 mg/dL  Heparin level (unfractionated)     Status: None   Collection Time: 08/21/19  8:56 AM  Result Value Ref Range   Heparin Unfractionated 0.58 0.30 - 0.70 IU/mL    Comment: (NOTE) If heparin results are below expected values, and patient dosage has  been confirmed, suggest follow up testing of antithrombin III levels. Performed at [facility], [place] [address], [place], [place] [address]     ECG   Atrial flutter (typical) with 2:1 AV Block - Personally Reviewed  Telemetry   Atrial flutter - Personally Reviewed  Radiology    Dg Chest  Port 1 View  Result Date: 08/20/2019 CLINICAL DATA:  Shortness of breath on exertion with tachycardia, history of asthma. EXAM: PORTABLE CHEST 1 VIEW COMPARISON:  None. FINDINGS: The heart size and mediastinal contours are  within normal limits. Both lungs are clear. Signs of previous lower cervical spinal fusion are partially imaged in the upper portion of this radiograph. IMPRESSION: No active disease. Electronically Signed   By: Zetta Bills M.D.   On: 08/20/2019 12:27    Cardiac Studies   Echo pending  Assessment   1. Principal Problem: 2.   Atrial flutter ([place]) 3. Active Problems: 4.   Leukocytosis 5.   Hyperglycemia 6.   Hypermagnesemia 7.   Unintentional weight loss 8.   Polyuria 9.   Plan   1. Mr. Necessary persists in typical atrial flutter which was not-rate controlled on cardizem - has become hypotensive and cardizem was stopped. He will likely need TEE-guided DCCV. Since troponins are negative, will transition heparin to Eliquis in preparation for TEE/DCCV tomorrow. Will need continued improvement of hyperglycemia and underlying risk factors to ensure optimal conversion.  Time Spent Directly with Patient:  I have spent a total of 35 minutes with the patient reviewing hospital notes, telemetry, EKGs, labs and examining the patient as well as establishing an assessment and plan that was discussed personally with the patient.  > 50% of time was spent in direct patient care.  Length of Stay:  LOS: 0 days   Pixie Casino, MD, [facility], [place] Director of the Advanced Lipid Disorders &  Cardiovascular Risk Reduction Clinic Diplomate of the American Board of Clinical Lipidology Attending Cardiologist  Direct Dial: [phone]   Fax: [phone]  Website:  [email or web address] 08/21/2019, 10:11 AM

## 2019-08-21 NOTE — ED Notes (Signed)
Pt is SVT on monitor, I stopped pt's cardizem drip

## 2019-08-21 NOTE — ED Notes (Signed)
Per Dr Hilbert Bible, Cardiology to be paged in reference to hypotensive state of pt. Cardizem rate reduced to 5 mg/h and 1L NS bolus started per EDP Ward.

## 2019-08-21 NOTE — Progress Notes (Signed)
Attempted Echocardiogram, HR was way too high. Will try again later when HR is normal as schedule permits.

## 2019-08-21 NOTE — Progress Notes (Signed)
Inpatient Diabetes Program Recommendations  AACE/ADA: New Consensus Statement on Inpatient Glycemic Control (2015)  Target Ranges:  Prepandial:   less than 140 mg/dL      Peak postprandial:   less than 180 mg/dL (1-2 hours)      Critically ill patients:  140 - 180 mg/dL   Lab Results  Component Value Date   GLUCAP 236 (H) 08/21/2019   HGBA1C 11.5 (H) 08/20/2019    Review of Glycemic Control  Diabetes history: None Outpatient Diabetes medications: None Current orders for Inpatient glycemic control: Lantus 20 units QD, Novolog 0-15 units tidwc and 0-5 units  HgbA1C - 11.5% - uncontrolled 236-302 mg/dL  Inpatient Diabetes Program Recommendations:     Add meal coverage - Novolog 4 units tidwc if pt eats > 50% meal  Spoke with pt and wife at length regarding new diagnosis of DM. Discussed HgbA1C of 11.5% and goal of 7-8% to reduce risk of long and short-term complications. 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 Had a physical every year and no one ever told him his blood sugar was high. States he drinks regular soda and juices. When discussing lower sugar beverages, pt states "I'm going to eat what I want and when I want it." Said he does not want to go home on insulin and prefers pills instead. Discussed monitoring and taking logbook to MD for review and any needed adjustments to medication. Pt does not have PCP and states his cardiologist "is going to get him one." Appears to be overwhelmed with new diagnosis. Said he was not going to read book (Living Well), although wife states she would.  Will need a lot of support with new diagnosis of DM. Will f/u on 9/23.  Thank you. Lorenda Peck, RD, LDN, CDE Inpatient Diabetes Coordinator [phone]

## 2019-08-21 NOTE — Discharge Instructions (Addendum)
Hypoglycemia °Hypoglycemia is when the sugar (glucose) level in your blood is too low. Signs of low blood sugar may include: °· Feeling: °? Hungry. °? Worried or nervous (anxious). °? Sweaty and clammy. °? Confused. °? Dizzy. °? Sleepy. °? Sick to your stomach (nauseous). °· Having: °? A fast heartbeat. °? A headache. °? A change in your vision. °? Tingling or no feeling (numbness) around your mouth, lips, or tongue. °? Jerky movements that you cannot control (seizure). °· Having trouble with: °? Moving (coordination). °? Sleeping. °? Passing out (fainting). °? Getting upset easily (irritability). °Low blood sugar can happen to people who have diabetes and people who do not have diabetes. Low blood sugar can happen quickly, and it can be an emergency. °Treating low blood sugar °Low blood sugar is often treated by eating or drinking something sugary right away, such as: °· Fruit juice, 4-6 oz (120-150 mL). °· Regular soda (not diet soda), 4-6 oz (120-150 mL). °· Low-fat milk, 4 oz (120 mL). °· Several pieces of hard candy. °· Sugar or honey, 1 Tbsp (15 mL). °Treating low blood sugar if you have diabetes °If you can think clearly and swallow safely, follow the 15:15 rule: °· Take 15 grams of a fast-acting carb (carbohydrate). Talk with your doctor about how much you should take. °· Always keep a source of fast-acting carb with you, such as: °? Sugar tablets (glucose pills). Take 3-4 pills. °? 6-8 pieces of hard candy. °? 4-6 oz (120-150 mL) of fruit juice. °? 4-6 oz (120-150 mL) of regular (not diet) soda. °? 1 Tbsp (15 mL) honey or sugar. °· Check your blood sugar 15 minutes after you take the carb. °· If your blood sugar is still at or below 70 mg/dL (3.9 mmol/L), take 15 grams of a carb again. °· If your blood sugar does not go above 70 mg/dL (3.9 mmol/L) after 3 tries, get help right away. °· After your blood sugar goes back to normal, eat a meal or a snack within 1 hour. ° °Treating very low blood sugar °If your  blood sugar is at or below 54 mg/dL (3 mmol/L), you have very low blood sugar (severe hypoglycemia). This may also cause: °· Passing out. °· Jerky movements you cannot control (seizure). °· Losing consciousness (coma). °This is an emergency. Do not wait to see if the symptoms will go away. Get medical help right away. Call your local emergency services (911 in the [place] Do not drive yourself to the hospital. °If you have very low blood sugar and you cannot eat or drink, you may need a glucagon shot (injection). A family member or friend should learn how to check your blood sugar and how to give you a glucagon shot. Ask your doctor if you need to have a glucagon shot kit at home. °Follow these instructions at home: °General instructions °· Take over-the-counter and prescription medicines only as told by your doctor. °· Stay aware of your blood sugar as told by your doctor. °· Limit alcohol intake to no more than 1 drink a day for nonpregnant women and 2 drinks a day for men. One drink equals 12 oz of beer (355 mL), 5 oz of wine (148 mL), or 1½ oz of hard liquor (44 mL). °· Keep all follow-up visits as told by your doctor. This is important. °If you have diabetes: ° °· Follow your diabetes care plan as told by your doctor. Make sure you: °? Know the signs of low blood sugar. °?   Take your medicines as told. ? Follow your exercise and meal plan. ? Eat on time. Do not skip meals. ? Check your blood sugar as often as told by your doctor. Always check it before and after exercise. ? Follow your sick day plan when you cannot eat or drink normally. Make this plan ahead of time with your doctor.  Share your diabetes care plan with: ? Your work or school. ? People you live with.  Check your pee (urine) for ketones: ? When you are sick. ? As told by your doctor.  Carry a card or wear jewelry that says you have diabetes. Contact a doctor if:  You have trouble keeping your blood sugar in your target  range.  You have low blood sugar often. Get help right away if:  You still have symptoms after you eat or drink something sugary.  Your blood sugar is at or below 54 mg/dL (3 mmol/L).  You have jerky movements that you cannot control.  You pass out. These symptoms may be an emergency. Do not wait to see if the symptoms will go away. Get medical help right away. Call your local emergency services (911 in the [place] Do not drive yourself to the hospital. Summary  Hypoglycemia happens when the level of sugar (glucose) in your blood is too low.  Low blood sugar can happen to people who have diabetes and people who do not have diabetes. Low blood sugar can happen quickly, and it can be an emergency.  Make sure you know the signs of low blood sugar and know how to treat it.  Always keep a source of sugar (fast-acting carb) with you to treat low blood sugar. This information is not intended to replace advice given to you by your health care provider. Make sure you discuss any questions you have with your health care provider. Document Released: 02/09/2010 Document Revised: 03/08/2019 Document Reviewed: 12/19/2015 Elsevier Patient Education  2020 [place]. Hyperglycemia Hyperglycemia is when the sugar (glucose) level in your blood is too high. It may not cause symptoms. If you do have symptoms, they may include warning signs, such as:  Feeling more thirsty than normal.  Hunger.  Feeling tired.  Needing to pee (urinate) more than normal.  Blurry eyesight (vision). You may get other symptoms as it gets worse, such as:  Dry mouth.  Not being hungry (loss of appetite).  Fruity-smelling breath.  Weakness.  Weight gain or loss that is not planned. Weight loss may be fast.  A tingling or numb feeling in your hands or feet.  Headache.  Skin that does not bounce back quickly when it is lightly pinched and released (poor skin turgor).  Pain in your belly (abdomen).  Cuts  or bruises that heal slowly. High blood sugar can happen to people who do or do not have diabetes. High blood sugar can happen slowly or quickly, and it can be an emergency. Follow these instructions at home: General instructions  Take over-the-counter and prescription medicines only as told by your doctor.  Do not use products that contain nicotine or tobacco, such as cigarettes and e-cigarettes. If you need help quitting, ask your doctor.  Limit alcohol intake to no more than 1 drink per day for nonpregnant women and 2 drinks per day for men. One drink equals 12 oz of beer, 5 oz of wine, or 1 oz of hard liquor.  Manage stress. If you need help with this, ask your doctor.  Keep all follow-up  visits as told by your doctor. This is important. Eating and drinking   Stay at a healthy weight.  Exercise regularly, as told by your doctor.  Drink enough fluid, especially when you: ? Exercise. ? Get sick. ? Are in hot temperatures.  Eat healthy foods, such as: ? Low-fat (lean) proteins. ? Complex carbs (complex carbohydrates), such as whole wheat bread or brown rice. ? Fresh fruits and vegetables. ? Low-fat dairy products. ? Healthy fats.  Drink enough fluid to keep your pee (urine) clear or pale yellow. If you have diabetes:   Make sure you know the symptoms of hyperglycemia.  Follow your diabetes management plan, as told by your doctor. Make sure you: ? Take insulin and medicines as told. ? Follow your exercise plan. ? Follow your meal plan. Eat on time. Do not skip meals. ? Check your blood sugar as often as told. Make sure to check before and after exercise. If you exercise longer or in a different way than you normally do, check your blood sugar more often. ? Follow your sick day plan whenever you cannot eat or drink normally. Make this plan ahead of time with your doctor.  Share your diabetes management plan with people in your workplace, school, and household.  Check  your urine for ketones when you are ill and as told by your doctor.  Carry a card or wear jewelry that says that you have diabetes. Contact a doctor if:  Your blood sugar level is higher than 240 mg/dL (13.3 mmol/L) for 2 days in a row.  You have problems keeping your blood sugar in your target range.  High blood sugar happens often for you. Get help right away if:  You have trouble breathing.  You have a change in how you think, feel, or act (mental status).  You feel sick to your stomach (nauseous), and that feeling does not go away.  You cannot stop throwing up (vomiting). These symptoms may be an emergency. Do not wait to see if the symptoms will go away. Get medical help right away. Call your local emergency services (911 in the [place] Do not drive yourself to the hospital. Summary  Hyperglycemia is when the sugar (glucose) level in your blood is too high.  High blood sugar can happen to people who do or do not have diabetes.  Make sure you drink enough fluids, eat healthy foods, and exercise regularly.  Contact your doctor if you have problems keeping your blood sugar in your target range. This information is not intended to replace advice given to you by your health care provider. Make sure you discuss any questions you have with your health care provider. Document Released: 09/12/2009 Document Revised: 08/02/2016 Document Reviewed: 08/02/2016 Elsevier Patient Education  2020 [place]. Blood Glucose Monitoring, Adult Monitoring your blood sugar (glucose) is an important part of managing your diabetes (diabetes mellitus). Blood glucose monitoring involves checking your blood glucose as often as directed and keeping a record (log) of your results over time. Checking your blood glucose regularly and keeping a blood glucose log can:  Help you and your health care provider adjust your diabetes management plan as needed, including your medicines or insulin.  Help you  understand how food, exercise, illnesses, and medicines affect your blood glucose.  Let you know what your blood glucose is at any time. You can quickly find out if you have low blood glucose (hypoglycemia) or high blood glucose (hyperglycemia). Your health care provider will set individualized  treatment goals for you. Your goals will be based on your age, other medical conditions you have, and how you respond to diabetes treatment. Generally, the goal of treatment is to maintain the following blood glucose levels:  Before meals (preprandial): 80-130 mg/dL (4.4-7.2 mmol/L).  After meals (postprandial): below 180 mg/dL (10 mmol/L).  A1c level: less than 7%. Supplies needed:  Blood glucose meter.  Test strips for your meter. Each meter has its own strips. You must use the strips that came with your meter.  A needle to prick your finger (lancet). Do not use a lancet more than one time.  A device that holds the lancet (lancing device).  A journal or log book to write down your results. How to check your blood glucose  1. Wash your hands with soap and water. 2. Prick the side of your finger (not the tip) with the lancet. Use a different finger each time. 3. Gently rub the finger until a small drop of blood appears. 4. Follow instructions that come with your meter for inserting the test strip, applying blood to the strip, and using your blood glucose meter. 5. Write down your result and any notes. Some meters allow you to use areas of your body other than your finger (alternative sites) to test your blood. The most common alternative sites are:  Forearm.  Thigh.  Palm of the hand. If you think you may have hypoglycemia, or if you have a history of not knowing when your blood glucose is getting low (hypoglycemia unawareness), do not use alternative sites. Use your finger instead. Alternative sites may not be as accurate as the fingers, because blood flow is slower in these areas. This  means that the result you get may be delayed, and it may be different from the result that you would get from your finger. Follow these instructions at home: Blood glucose log   Every time you check your blood glucose, write down your result. Also write down any notes about things that may be affecting your blood glucose, such as your diet and exercise for the day. This information can help you and your health care provider: ? Look for patterns in your blood glucose over time. ? Adjust your diabetes management plan as needed.  Check if your meter allows you to download your records to a computer. Most glucose meters store a record of glucose readings in the meter. If you have type 1 diabetes:  Check your blood glucose 2 or more times a day.  Also check your blood glucose: ? Before every insulin injection. ? Before and after exercise. ? Before meals. ? 2 hours after a meal. ? Occasionally between 2:00 a.m. and 3:00 a.m., as directed. ? Before potentially dangerous tasks, like driving or using heavy machinery. ? At bedtime.  You may need to check your blood glucose more often, up to 6-10 times a day, if you: ? Use an insulin pump. ? Need multiple daily injections (MDI). ? Have diabetes that is not well-controlled. ? Are ill. ? Have a history of severe hypoglycemia. ? Have hypoglycemia unawareness. If you have type 2 diabetes:  If you take insulin or other diabetes medicines, check your blood glucose 2 or more times a day.  If you are on intensive insulin therapy, check your blood glucose 4 or more times a day. Occasionally, you may also need to check between 2:00 a.m. and 3:00 a.m., as directed.  Also check your blood glucose: ? Before and after exercise. ?  Before potentially dangerous tasks, like driving or using heavy machinery.  You may need to check your blood glucose more often if: ? Your medicine is being adjusted. ? Your diabetes is not well-controlled. ? You are  ill. General tips  Always keep your supplies with you.  If you have questions or need help, all blood glucose meters have a 24-hour "hotline" phone number that you can call. You may also contact your health care provider.  After you use a few boxes of test strips, adjust (calibrate) your blood glucose meter by following instructions that came with your meter. Contact a health care provider if:  Your blood glucose is at or above 240 mg/dL (13.3 mmol/L) for 2 days in a row.  You have been sick or have had a fever for 2 days or longer, and you are not getting better.  You have any of the following problems for more than 6 hours: ? You cannot eat or drink. ? You have nausea or vomiting. ? You have diarrhea. Get help right away if:  Your blood glucose is lower than 54 mg/dL (3 mmol/L).  You become confused or you have trouble thinking clearly.  You have difficulty breathing.  You have moderate or large ketone levels in your urine. Summary  Monitoring your blood sugar (glucose) is an important part of managing your diabetes (diabetes mellitus).  Blood glucose monitoring involves checking your blood glucose as often as directed and keeping a record (log) of your results over time.  Your health care provider will set individualized treatment goals for you. Your goals will be based on your age, other medical conditions you have, and how you respond to diabetes treatment.  Every time you check your blood glucose, write down your result. Also write down any notes about things that may be affecting your blood glucose, such as your diet and exercise for the day. This information is not intended to replace advice given to you by your health care provider. Make sure you discuss any questions you have with your health care provider. Document Released: 11/18/2003 Document Revised: 09/08/2018 Document Reviewed: 04/26/2016 Elsevier Patient Education  [place]. Diabetes Mellitus and  Nutrition, Adult When you have diabetes (diabetes mellitus), it is very important to have healthy eating habits because your blood sugar (glucose) levels are greatly affected by what you eat and drink. Eating healthy foods in the appropriate amounts, at about the same times every day, can help you:  Control your blood glucose.  Lower your risk of heart disease.  Improve your blood pressure.  Reach or maintain a healthy weight. Every person with diabetes is different, and each person has different needs for a meal plan. Your health care provider may recommend that you work with a diet and nutrition specialist (dietitian) to make a meal plan that is best for you. Your meal plan may vary depending on factors such as:  The calories you need.  The medicines you take.  Your weight.  Your blood glucose, blood pressure, and cholesterol levels.  Your activity level.  Other health conditions you have, such as heart or kidney disease. How do carbohydrates affect me? Carbohydrates, also called carbs, affect your blood glucose level more than any other type of food. Eating carbs naturally raises the amount of glucose in your blood. Carb counting is a method for keeping track of how many carbs you eat. Counting carbs is important to keep your blood glucose at a healthy level, especially if you use  insulin or take certain oral diabetes medicines. It is important to know how many carbs you can safely have in each meal. This is different for every person. Your dietitian can help you calculate how many carbs you should have at each meal and for each snack. Foods that contain carbs include:  Bread, cereal, rice, pasta, and crackers.  Potatoes and corn.  Peas, beans, and lentils.  Milk and yogurt.  Fruit and juice.  Desserts, such as cakes, cookies, ice cream, and candy. How does alcohol affect me? Alcohol can cause a sudden decrease in blood glucose (hypoglycemia), especially if you use insulin  or take certain oral diabetes medicines. Hypoglycemia can be a life-threatening condition. Symptoms of hypoglycemia (sleepiness, dizziness, and confusion) are similar to symptoms of having too much alcohol. If your health care provider says that alcohol is safe for you, follow these guidelines:  Limit alcohol intake to no more than 1 drink per day for nonpregnant women and 2 drinks per day for men. One drink equals 12 oz of beer, 5 oz of wine, or 1 oz of hard liquor.  Do not drink on an empty stomach.  Keep yourself hydrated with water, diet soda, or unsweetened iced tea.  Keep in mind that regular soda, juice, and other mixers may contain a lot of sugar and must be counted as carbs. What are tips for following this plan?  Reading food labels  Start by checking the serving size on the "Nutrition Facts" label of packaged foods and drinks. The amount of calories, carbs, fats, and other nutrients listed on the label is based on one serving of the item. Many items contain more than one serving per package.  Check the total grams (g) of carbs in one serving. You can calculate the number of servings of carbs in one serving by dividing the total carbs by 15. For example, if a food has 30 g of total carbs, it would be equal to 2 servings of carbs.  Check the number of grams (g) of saturated and trans fats in one serving. Choose foods that have low or no amount of these fats.  Check the number of milligrams (mg) of salt (sodium) in one serving. Most people should limit total sodium intake to less than 2,300 mg per day.  Always check the nutrition information of foods labeled as "low-fat" or "nonfat". These foods may be higher in added sugar or refined carbs and should be avoided.  Talk to your dietitian to identify your daily goals for nutrients listed on the label. Shopping  Avoid buying canned, premade, or processed foods. These foods tend to be high in fat, sodium, and added sugar.  Shop  around the outside edge of the grocery store. This includes fresh fruits and vegetables, bulk grains, fresh meats, and fresh dairy. Cooking  Use low-heat cooking methods, such as baking, instead of high-heat cooking methods like deep frying.  Cook using healthy oils, such as olive, canola, or sunflower oil.  Avoid cooking with butter, cream, or high-fat meats. Meal planning  Eat meals and snacks regularly, preferably at the same times every day. Avoid going long periods of time without eating.  Eat foods high in fiber, such as fresh fruits, vegetables, beans, and whole grains. Talk to your dietitian about how many servings of carbs you can eat at each meal.  Eat 4-6 ounces (oz) of lean protein each day, such as lean meat, chicken, fish, eggs, or tofu. One oz of lean protein is equal  to: ? 1 oz of meat, chicken, or fish. ? 1 egg. ?  cup of tofu.  Eat some foods each day that contain healthy fats, such as avocado, nuts, seeds, and fish. Lifestyle  Check your blood glucose regularly.  Exercise regularly as told by your health care provider. This may include: ? 150 minutes of moderate-intensity or vigorous-intensity exercise each week. This could be brisk walking, biking, or water aerobics. ? Stretching and doing strength exercises, such as yoga or weightlifting, at least 2 times a week.  Take medicines as told by your health care provider.  Do not use any products that contain nicotine or tobacco, such as cigarettes and e-cigarettes. If you need help quitting, ask your health care provider.  Work with a Social worker or diabetes educator to identify strategies to manage stress and any emotional and social challenges. Questions to ask a health care provider  Do I need to meet with a diabetes educator?  Do I need to meet with a dietitian?  What number can I call if I have questions?  When are the best times to check my blood glucose? Where to find more information:  American  Diabetes Association: diabetes.org  Academy of Nutrition and Dietetics: www.eatright.[employer] of Diabetes and Digestive and Kidney Diseases (NIH): [employer] Summary  A healthy meal plan will help you control your blood glucose and maintain a healthy lifestyle.  Working with a diet and nutrition specialist (dietitian) can help you make a meal plan that is best for you.  Keep in mind that carbohydrates (carbs) and alcohol have immediate effects on your blood glucose levels. It is important to count carbs and to use alcohol carefully. This information is not intended to replace advice given to you by your health care provider. Make sure you discuss any questions you have with your health care provider. Document Released: 08/12/2005 Document Revised: 10/28/2017 Document Reviewed: 12/20/2016 Elsevier Patient Education  [place]. Insulin Aspart injection What is this medicine? INSULIN ASPART (IN su lin AS part) is a human-made form of insulin. This drug lowers the amount of sugar in your blood. It is a fast acting insulin that starts working faster than regular insulin. It will not work as long as regular insulin. This medicine may be used for other purposes; ask your health care provider or pharmacist if you have questions. COMMON BRAND NAME(S): Fiasp, [employer], Medtronic, NovoLog, NovoLog Flexpen, NovoLog PenFill What should I tell my health care provider before I take this medicine? They need to know if you have any of these conditions:  episodes of low blood sugar  eye disease, vision problems  kidney disease  liver disease  an unusual or allergic reaction to insulin, metacresol, other medicines, foods, dyes, or preservatives  pregnant or trying to get pregnant  breast-feeding How should I use this medicine? This medicine is for injection under the skin. Use exactly as directed. It is important to follow the directions given to you by  your health care professional or doctor. If you are using Novolog, you should start your meal within 5 to 10 minutes after injection. If you are using Fiasp, you should start your meal at the time of injection or within 20 minutes after injection. Have food ready before injection. Do not delay eating. You will be taught how to use this medicine and how to adjust doses for activities and illness. Do not use more insulin than prescribed. Do not use more or less often  than prescribed. Always check the appearance of your insulin before using it. This medicine should be clear and colorless like water. Do not use if it is cloudy, thickened, colored, or has solid particles in it. If you use a pen, be sure to take off the outer needle cover before using the dose. It is important that you put your used needles and syringes in a special sharps container. Do not put them in a trash can. If you do not have a sharps container, call your pharmacist or healthcare provider to get one. Talk to your pediatrician regarding the use of this medicine in children. While this drug may be prescribed for children as young as 2 years for selected conditions, precautions do apply. Overdosage: If you think you have taken too much of this medicine contact a poison control center or emergency room at once. NOTE: This medicine is only for you. Do not share this medicine with others. What if I miss a dose? It is important not to miss a dose. Your health care professional or doctor should discuss a plan for missed doses with you. If you do miss a dose, follow their plan. Do not take double doses. What may interact with this medicine?  other medicines for diabetes Many medications may cause an increase or decrease in blood sugar, these include:  alcohol containing beverages  antiviral medicines for HIV or AIDS  aspirin and aspirin-like drugs  certain medicines for depression, anxiety, or psychotic  disturbances  chromium  diuretics  male hormones, like estrogens or progestins and birth control pills  heart medicines  isoniazid  MAOIs like Carbex, Eldepryl, Marplan, Nardil, and Parnate  male hormones or anabolic steroids  medicines for weight loss  medicines for allergies, asthma, cold, or cough  niacin  NSAIDs, medicines for pain and inflammation, like ibuprofen or naproxen  octreotide  pentamidine  phenytoin  probenecid  quinolone antibiotics like ciprofloxacin, levofloxacin, ofloxacin  some herbal dietary supplements  steroid medicines like prednisone or cortisone  sulfamethoxazole; trimethoprim  thyroid medicine Some medications can hide the warning symptoms of low blood sugar. You may need to monitor your blood sugar more closely if you are taking one of these medications. These include:  beta-blockers such as atenolol, metoprolol, propranolol  clonidine  guanethidine  reserpine This list may not describe all possible interactions. Give your health care provider a list of all the medicines, herbs, non-prescription drugs, or dietary supplements you use. Also tell them if you smoke, drink alcohol, or use illegal drugs. Some items may interact with your medicine. What should I watch for while using this medicine? Visit your health care professional or doctor for regular checks on your progress. A test called the HbA1C (A1C) will be monitored. This is a simple blood test. It measures your blood sugar control over the last 2 to 3 months. You will receive this test every 3 to 6 months. Learn how to check your blood sugar. Learn the symptoms of low and high blood sugar and how to manage them. Always carry a quick-source of sugar with you in case you have symptoms of low blood sugar. Examples include hard sugar candy or glucose tablets. Make sure others know that you can choke if you eat or drink when you develop serious symptoms of low blood sugar, such as  seizures or unconsciousness. They must get medical help at once. Tell your doctor or health care professional if you have high blood sugar. You might need to change the dose  of your medicine. If you are sick or exercising more than usual, you might need to change the dose of your medicine. Do not skip meals. Ask your doctor or health care professional if you should avoid alcohol. Many nonprescription cough and cold products contain sugar or alcohol. These can affect blood sugar. Make sure that you have the right kind of syringe for the type of insulin you use. Try not to change the brand and type of insulin or syringe unless your health care professional or doctor tells you to. Switching insulin brand or type can cause dangerously high or low blood sugar. Always keep an extra supply of insulin, syringes, and needles on hand. Use a syringe one time only. Throw away syringe and needle in a closed container to prevent accidental needle sticks. Insulin pens and cartridges should never be shared. Even if the needle is changed, sharing may result in passing of viruses like hepatitis or HIV. Each time you get a new box of pen needles, check to see if they are the same type as the ones you were trained to use. If not, ask your health care professional to show you how to use this new type properly. Wear a medical ID bracelet or chain, and carry a card that describes your disease and details of your medicine and dosage times. What side effects may I notice from receiving this medicine? Side effects that you should report to your doctor or health care professional as soon as possible:  allergic reactions like skin rash, itching or hives, swelling of the face, lips, or tongue  breathing problems  signs and symptoms of high blood sugar such as dizziness, dry mouth, dry skin, fruity breath, nausea, stomach pain, increased hunger or thirst, increased urination  signs and symptoms of low blood sugar such as feeling  anxious, confusion, dizziness, increased hunger, unusually weak or tired, sweating, shakiness, cold, irritable, headache, blurred vision, fast heartbeat, loss of consciousness Side effects that usually do not require medical attention (report to your doctor or health care professional if they continue or are bothersome):  increase or decrease in fatty tissue under the skin due to overuse of a particular injection site  itching, burning, swelling, or rash at site where injected This list may not describe all possible side effects. Call your doctor for medical advice about side effects. You may report side effects to FDA at 1-800-FDA-1088. Where should I keep my medicine? Keep out of the reach of children. Unopened Vials: Novolog Vials: Store in a refrigerator between 2 and 8 degrees C (36 and 46 degrees F) or at room temperature below 30 degrees C (86 degrees F). Do not freeze or use if the insulin has been frozen. Protect from light and excessive heat. If stored at room temperature, the vial must be discarded after 28 days. Throw away any unopened and unused medicine that has been stored in the refrigerator after the expiration date. Fiasp Vials: Store in a refrigerator between 2 and 8 degrees C (36 and 46 degrees F) or at room temperature below 30 degrees C (86 degrees F). Do not freeze or use if the insulin has been frozen. Protect from light and excessive heat. If stored at room temperature, the vial must be discarded after 28 days. Throw away any unopened and unused medicine that has been stored in the refrigerator after the expiration date. Unopened Pens and Cartridges: Novolog Flexpens and cartridges: Store in a refrigerator between 2 and 8 degrees C (36 and 46  degrees F) or at room temperature below 30 degrees C (86 degrees F). Do not freeze or use if the insulin has been frozen. Protect from light and excessive heat. If stored at room temperature, the pen or cartridge must be discarded after 28  days. Throw away any unopened and unused medicine that has been stored in the refrigerator after the expiration date. Fiasp FlexTouch pens: Store in a refrigerator between 2 and 8 degrees C (36 and 46 degrees F) or at room temperature below 30 degrees C (86 degrees F). Do not freeze or use if the insulin has been frozen. Protect from light and excessive heat. If stored at room temperature, the pen must be discarded after 28 days. Throw away any unopened and unused medicine that has been stored in the refrigerator after the expiration date. Fiasp FlexTouch cartridges: Store at room temperature below 30 degrees C (86 degrees F). Do not refrigerate or freeze. Keep away from heat and light. Throw the cartridge away after 28 days, even if it still has insulin left in it. Vials that you are using: Novolog Vials: Store in the refrigerator or at room temperature below 30 degrees C (86 degrees F). Do not freeze. Keep away from heat and light. Throw the opened vial away after 28 days. Fiasp Vials: Store in the refrigerator or at room temperature below 30 degrees C (86 degrees F). Do not freeze. Keep away from heat and light. Throw the opened vial away after 28 days. Pens and cartridges that you are using: Novolog Flexpens and cartridges: Store at room temperature below 30 degrees C (86 degrees F). Do not refrigerate or freeze. Keep away from heat and light. Throw away the pen or cartridge after 28 days, even if it still has insulin left in it. Fiasp FlexTouch pens: Store in the refrigerator or at room temperature below 30 degrees C (86 degrees F). Do not freeze. Keep away from heat and light. Throw the pen away after 28 days, even if it still has insulin left in it. Fiasp FlexTouch cartridges: Store at room temperature below 30 degrees C (86 degrees F). Do not refrigerate or freeze. Keep away from heat and light. Throw the cartridge away after 28 days, even if it still has insulin left in it. NOTE: This sheet is a  summary. It may not cover all possible information. If you have questions about this medicine, talk to your doctor, pharmacist, or health care provider.  2020 [employer]vier/Gold Standard (2019-02-23 11:35:39) Information on my medicine - ELIQUIS (apixaban)  Why was Eliquis prescribed for you? Eliquis was prescribed for you to reduce the risk of a blood clot forming that can cause a stroke if you have a medical condition called atrial fibrillation (a type of irregular heartbeat).  What do You need to know about Eliquis ? Take your Eliquis TWICE DAILY - one tablet in the morning and one tablet in the evening with or without food. If you have difficulty swallowing the tablet whole please discuss with your pharmacist how to take the medication safely.  Take Eliquis exactly as prescribed by your doctor and DO NOT stop taking Eliquis without talking to the doctor who prescribed the medication.  Stopping may increase your risk of developing a stroke.  Refill your prescription before you run out.  After discharge, you should have regular check-up appointments with your healthcare provider that is prescribing your Eliquis.  In the future your dose may need to be changed if your kidney function or weight  changes by a significant amount or as you get older.  What do you do if you miss a dose? If you miss a dose, take it as soon as you remember on the same day and resume taking twice daily.  Do not take more than one dose of ELIQUIS at the same time to make up a missed dose.  Important Safety Information A possible side effect of Eliquis is bleeding. You should call your healthcare provider right away if you experience any of the following: ? Bleeding from an injury or your nose that does not stop. ? Unusual colored urine (red or dark brown) or unusual colored stools (red or black). ? Unusual bruising for unknown reasons. ? A serious fall or if you hit your head (even if there is no bleeding).  Some  medicines may interact with Eliquis and might increase your risk of bleeding or clotting while on Eliquis. To help avoid this, consult your healthcare provider or pharmacist prior to using any new prescription or non-prescription medications, including herbals, vitamins, non-steroidal anti-inflammatory drugs (NSAIDs) and supplements.  This website has more information on Eliquis (apixaban): http://[email or web address].com/eliquis/home

## 2019-08-21 NOTE — ED Provider Notes (Signed)
1:00 AM  I was asked by ED nurse for orders for patient as they were unable to get in touch with admitting team.  It appears patient is here for A. fib with RVR.  He is on diltiazem and has had intermittent hypotension.  Mentating normally.  He has received 1 L of IV fluids in the ED.  No previous echocardiogram in our system.  Recommended giving second bag of IV fluids, repeat EKG and turning down diltiazem.  Nursing staff to contact midlevel [facility] on call for triad hospitalists for further recommendations.   Ward, Delice Bison, DO 08/21/19 0102

## 2019-08-21 NOTE — Progress Notes (Signed)
[place] for heparin Indication: aflutter  No Known Allergies  Patient Measurements: Height: 6\' 4"  (193 cm) Weight: 198 lb (89.8 kg) IBW/kg (Calculated) : 86.8 Heparin Dosing Weight: 90kg  Vital Signs: Temp: 99 F (37.2 C) (09/21 1956) Temp Source: Oral (09/21 1956) BP: 97/63 (09/22 0115) Pulse Rate: 150 (09/22 0115)  Labs: Recent Labs    08/20/19 1132 08/20/19 1612 08/21/19 0204  HGB 16.9  --  14.0  HCT 49.0  --  41.4  PLT 214  --  146*  HEPARINUNFRC  --   --  0.52  CREATININE 0.97  --  0.90  TROPONINIHS 14 14  --     Estimated Creatinine Clearance: 97.8 mL/min (by C-G formula based on SCr of 0.9 mg/dL).   Medical History: Past Medical History:  Diagnosis Date  . Asthma    as child   Assessment: 68 year old male admitted from urgent care with increased thirst, urinary frequency and abnormal EKG. Aflutter noted on EKG, IV heparin ordered. No meds prior to admit.   Initial heparin level therapeutic at 0.52  Goal of Therapy:  Heparin level 0.3-0.7 units/ml Monitor platelets by anticoagulation protocol: Yes   Plan:  Continue heparin gtt at 1300 units/hr Heparin level in 6 hours to confirm Daily Heparin level, CBC, s/s bleeding F/u cards plan for PO [facility]  Bertis Ruddy, PharmD Clinical Pharmacist Please check AMION for all [place] numbers 08/21/2019 2:47 AM

## 2019-08-21 NOTE — ED Notes (Addendum)
Per Cardiology on-call, "give 538mL NS bolus and continue to monitor HR and BP. Increase cardizem as pressure increases". Orders carried out realtime by this RN.

## 2019-08-21 NOTE — ED Notes (Signed)
ED TO INPATIENT HANDOFF REPORT  ED Nurse Name and Phone #: Lorrin Goodell [phone]  S Name/Age/Gender Cameron Arroyo 68 y.o. male Room/Bed: [id number]  Code Status   Code Status: Full Code  Home/SNF/Other Home Patient oriented to: self, place, time and situation Is this baseline? Yes   Triage Complete: Triage complete  Chief Complaint ABN EKG/SENT BY UC  Triage Note Pt from UC with abnormal EKG reading, reports sob with other complaints of dry mouth and shortness of breath. He denies any medical care and reports recent loss of weight. NAD at triage. EKG given to EDP [place]. Pt assigned to room STAT.      Allergies No Known Allergies  Level of Care/Admitting Diagnosis ED Disposition    ED Disposition Condition [facility] Area: [place] [100100]  Level of Care: Progressive [102]  I expect the patient will be discharged within 24 hours: No (not a candidate for 5C-Observation unit)  Covid Evaluation: Asymptomatic Screening Protocol (No Symptoms)  Diagnosis: Atrial flutter (HCC) [427.32.ICD-9-CM]  Admitting Physician: Mckinley Jewel [id number]  Attending Physician: Mckinley Jewel [phone]  PT Class (Do Not Modify): Observation [104]  PT Acc Code (Do Not Modify): Observation [10022]       B Medical/Surgery History Past Medical History:  Diagnosis Date  . Asthma    as child   Past Surgical History:  Procedure Laterality Date  . ANTERIOR CERVICAL DECOMP/DISCECTOMY FUSION N/A 03/01/2013   Procedure: Cervical Four-Five Cervical Five-Six Cervical Six-Seven anterior cervical decompression with fusion plating and bonegraft;  Surgeon: Hosie Spangle, MD;  Location: [facility];  Service: Neurosurgery;  Laterality: N/A;  ANTERIOR CERVICAL DECOMPRESSION/DISCECTOMY FUSION 3 LEVELS     A IV Location/Drains/Wounds Patient Lines/Drains/Airways Status   Active Line/Drains/Airways    Name:   Placement date:   Placement time:   Site:   Days:    Peripheral IV 08/20/19 Right Wrist   08/20/19    1143    Wrist   1   Peripheral IV 08/20/19 Left Wrist   08/20/19    1556    Wrist   1   Incision 03/01/13 Neck   03/01/13    1524     2364          Intake/Output Last 24 hours  Intake/Output Summary (Last 24 hours) at 08/21/2019 0900 Last data filed at 08/21/2019 0751 Gross per 24 hour  Intake 1211.49 ml  Output -  Net 1211.49 ml    Labs/Imaging Results for orders placed or performed during the hospital encounter of 08/20/19 (from the past 48 hour(s))  Basic metabolic panel     Status: Abnormal   Collection Time: 08/20/19 11:32 AM  Result Value Ref Range   Sodium 137 135 - 145 mmol/L   Potassium 4.9 3.5 - 5.1 mmol/L   Chloride 102 98 - 111 mmol/L   CO2 24 22 - 32 mmol/L   Glucose, Bld 430 (H) 70 - 99 mg/dL   BUN 21 8 - 23 mg/dL   Creatinine, Ser 0.97 0.61 - 1.24 mg/dL   Calcium 9.3 8.9 - 10.3 mg/dL   GFR calc non Af Amer >60 >60 mL/min   GFR calc Af Amer >60 >60 mL/min   Anion gap 11 5 - 15    Comment: Performed at [facility], [place] [address] [place], [place] [address]  CBC     Status: Abnormal   Collection Time: 08/20/19 11:32 AM  Result Value Ref  Range   WBC 12.7 (H) 4.0 - 10.5 K/uL   RBC 5.20 4.22 - 5.81 MIL/uL   Hemoglobin 16.9 13.0 - 17.0 g/dL   HCT 49.0 39.0 - 52.0 %   MCV 94.2 80.0 - 100.0 fL   MCH 32.5 26.0 - 34.0 pg   MCHC 34.5 30.0 - 36.0 g/dL   RDW 12.2 11.5 - 15.5 %   Platelets 214 150 - 400 K/uL   nRBC 0.0 0.0 - 0.2 %    Comment: Performed at [facility], [place] [address] [place], [place] [address]  Troponin I (High Sensitivity)     Status: None   Collection Time: 08/20/19 11:32 AM  Result Value Ref Range   Troponin I (High Sensitivity) 14 <18 ng/L    Comment: (NOTE) Elevated high sensitivity troponin I (hsTnI) values and significant  changes across serial measurements may suggest ACS but many other  chronic and acute conditions are known to elevate hsTnI results.  Refer to the "Links"  section for chest pain algorithms and additional  guidance. Performed at [facility], [place] [address], [place], [place] [address]   Magnesium     Status: Abnormal   Collection Time: 08/20/19 11:32 AM  Result Value Ref Range   Magnesium 2.5 (H) 1.7 - 2.4 mg/dL    Comment: Performed at [facility] [address] [place], [place] [address]  Hemoglobin A1c     Status: Abnormal   Collection Time: 08/20/19 11:32 AM  Result Value Ref Range   Hgb A1c MFr Bld 11.5 (H) 4.8 - 5.6 %    Comment: (NOTE) Pre diabetes:          5.7%-6.4% Diabetes:              >6.4% Glycemic control for   <7.0% adults with diabetes    Mean Plasma Glucose 283.35 mg/dL    Comment: Performed at [facility] [address], [place], [place] [address]  SARS CORONAVIRUS 2 (TAT 6-24 HRS) Nasopharyngeal Nasopharyngeal Swab     Status: None   Collection Time: 08/20/19 11:51 AM   Specimen: Nasopharyngeal Swab  Result Value Ref Range   SARS Coronavirus 2 NEGATIVE NEGATIVE    Comment: (NOTE) SARS-CoV-2 target nucleic acids are NOT DETECTED. The SARS-CoV-2 RNA is generally detectable in upper and lower respiratory specimens during the acute phase of infection. Negative results do not preclude SARS-CoV-2 infection, do not rule out co-infections with other pathogens, and should not be used as the sole basis for treatment or other patient management decisions. Negative results must be combined with clinical observations, patient history, and epidemiological information. The expected result is Negative. Fact Sheet for Patients: SugarRoll.be Fact Sheet for Healthcare Providers: https://www.woods-mathews.com/ This test is not yet approved or cleared by the [place] FDA and  has been authorized for detection and/or diagnosis of SARS-CoV-2 by FDA under an Emergency Use Authorization (EUA). This EUA will remain  in effect (meaning this test can be used) for the  duration of the COVID-19 declaration under Section 56 4(b)(1) of the Act, 21 U.S.C. section 360bbb-3(b)(1), unless the authorization is terminated or revoked sooner. Performed at [facility], [place] [address] [place], [place] [address]   Troponin I (High Sensitivity)     Status: None   Collection Time: 08/20/19  4:12 PM  Result Value Ref Range   Troponin I (High Sensitivity) 14 <18 ng/L    Comment: (NOTE) Elevated high sensitivity troponin I (  hsTnI) values and significant  changes across serial measurements may suggest ACS but many other  chronic and acute conditions are known to elevate hsTnI results.  Refer to the "Links" section for chest pain algorithms and additional  guidance. Performed at [facility], [place] [address] [place], [place] [address]   TSH     Status: None   Collection Time: 08/20/19  4:12 PM  Result Value Ref Range   TSH 1.194 0.350 - 4.500 uIU/mL    Comment: Performed by a 3rd Generation assay with a functional sensitivity of <=0.01 uIU/mL. Performed at [facility], [place] [address], [place], [place] [address]   T4, free     Status: None   Collection Time: 08/20/19  4:12 PM  Result Value Ref Range   Free T4 1.05 0.61 - 1.12 ng/dL    Comment: (NOTE) Biotin ingestion may interfere with free T4 tests. If the results are inconsistent with the TSH level, previous test results, or the clinical presentation, then consider biotin interference. If needed, order repeat testing after stopping biotin. Performed at [facility], [place] [address] [place], [place] [address]   Urinalysis, Routine w reflex microscopic     Status: Abnormal   Collection Time: 08/20/19  9:00 PM  Result Value Ref Range   Color, Urine YELLOW YELLOW   APPearance CLEAR CLEAR   Specific Gravity, Urine 1.036 (H) 1.005 - 1.030   pH 5.0 5.0 - 8.0   Glucose, UA >=500 (A) NEGATIVE mg/dL   Hgb urine dipstick NEGATIVE NEGATIVE   Bilirubin Urine NEGATIVE NEGATIVE   Ketones,  ur 80 (A) NEGATIVE mg/dL   Protein, ur NEGATIVE NEGATIVE mg/dL   Nitrite NEGATIVE NEGATIVE   Leukocytes,Ua NEGATIVE NEGATIVE   RBC / HPF 0-5 0 - 5 RBC/hpf   WBC, UA 0-5 0 - 5 WBC/hpf   Bacteria, UA RARE (A) NONE SEEN   Squamous Epithelial / LPF 0-5 0 - 5   Mucus PRESENT     Comment: Performed at [facility], [place] [address], [place], [place] [address]  Urine rapid drug screen (hosp performed)     Status: None   Collection Time: 08/20/19  9:00 PM  Result Value Ref Range   Opiates NONE DETECTED NONE DETECTED   Cocaine NONE DETECTED NONE DETECTED   Benzodiazepines NONE DETECTED NONE DETECTED   Amphetamines NONE DETECTED NONE DETECTED   Tetrahydrocannabinol NONE DETECTED NONE DETECTED   Barbiturates NONE DETECTED NONE DETECTED    Comment: (NOTE) DRUG SCREEN FOR MEDICAL PURPOSES ONLY.  IF CONFIRMATION IS NEEDED FOR ANY PURPOSE, NOTIFY LAB WITHIN 5 DAYS. LOWEST DETECTABLE LIMITS FOR URINE DRUG SCREEN Drug Class                     Cutoff (ng/mL) Amphetamine and metabolites    1000 Barbiturate and metabolites    200 Benzodiazepine                 [id number] Tricyclics and metabolites     300 Opiates and metabolites        300 Cocaine and metabolites        300 THC                            50 Performed at [facility], [place] [address], [place], [place] [address]   CBG monitoring, ED     Status: Abnormal   Collection  Time: 08/20/19 11:40 PM  Result Value Ref Range   Glucose-Capillary 399 (H) 70 - 99 mg/dL  CBC     Status: Abnormal   Collection Time: 08/21/19  2:04 AM  Result Value Ref Range   WBC 9.3 4.0 - 10.5 K/uL   RBC 4.31 4.22 - 5.81 MIL/uL   Hemoglobin 14.0 13.0 - 17.0 g/dL   HCT 41.4 39.0 - 52.0 %   MCV 96.1 80.0 - 100.0 fL   MCH 32.5 26.0 - 34.0 pg   MCHC 33.8 30.0 - 36.0 g/dL   RDW 12.3 11.5 - 15.5 %   Platelets 146 (L) 150 - 400 K/uL   nRBC 0.0 0.0 - 0.2 %    Comment: Performed at [facility], [place] [address] [place], [place] [address]   Comprehensive metabolic panel     Status: Abnormal   Collection Time: 08/21/19  2:04 AM  Result Value Ref Range   Sodium 136 135 - 145 mmol/L   Potassium 4.0 3.5 - 5.1 mmol/L    Comment: DELTA CHECK NOTED   Chloride 105 98 - 111 mmol/L   CO2 23 22 - 32 mmol/L   Glucose, Bld 340 (H) 70 - 99 mg/dL   BUN 19 8 - 23 mg/dL   Creatinine, Ser 0.90 0.61 - 1.24 mg/dL   Calcium 7.9 (L) 8.9 - 10.3 mg/dL   Total Protein 5.2 (L) 6.5 - 8.1 g/dL   Albumin 3.0 (L) 3.5 - 5.0 g/dL   AST 15 15 - 41 U/L   ALT 21 0 - 44 U/L   Alkaline Phosphatase 66 38 - 126 U/L   Total Bilirubin 1.3 (H) 0.3 - 1.2 mg/dL   GFR calc non Af Amer >60 >60 mL/min   GFR calc Af Amer >60 >60 mL/min   Anion gap 8 5 - 15    Comment: Performed at [facility], [place] [address] [place], [place] [address]  Heparin level (unfractionated)     Status: None   Collection Time: 08/21/19  2:04 AM  Result Value Ref Range   Heparin Unfractionated 0.52 0.30 - 0.70 IU/mL    Comment: (NOTE) If heparin results are below expected values, and patient dosage has  been confirmed, suggest follow up testing of antithrombin III levels. Performed at [facility], [place] [address] [place], [place] [address]   CBG monitoring, ED     Status: Abnormal   Collection Time: 08/21/19  6:02 AM  Result Value Ref Range   Glucose-Capillary 302 (H) 70 - 99 mg/dL  CBG monitoring, ED     Status: Abnormal   Collection Time: 08/21/19  8:15 AM  Result Value Ref Range   Glucose-Capillary 282 (H) 70 - 99 mg/dL   Dg Chest Port 1 View  Result Date: 08/20/2019 CLINICAL DATA:  Shortness of breath on exertion with tachycardia, history of asthma. EXAM: PORTABLE CHEST 1 VIEW COMPARISON:  None. FINDINGS: The heart size and mediastinal contours are within normal limits. Both lungs are clear. Signs of previous lower cervical spinal fusion are partially imaged in the upper portion of this radiograph. IMPRESSION: No active disease. Electronically Signed   By:  Zetta Bills M.D.   On: 08/20/2019 12:27    Pending Labs Unresulted Labs (From admission, onward)    Start     Ordered   08/22/19 0500  Heparin level (unfractionated)  Daily,   R     08/21/19 0249   08/22/19 [id number]  Basic metabolic panel  Daily,   R     08/21/19 0849   08/21/19 0900  Heparin level (unfractionated)  Once-Timed,   STAT     08/21/19 0249   08/21/19 0900  HIV antibody (Routine Testing)  Once,   STAT     08/21/19 0352   08/21/19 0500  CBC  Daily,   R     08/20/19 1535   08/20/19 1747  C-peptide  Add-on,   AD     08/20/19 1746          Vitals/Pain Today's Vitals   08/21/19 0730 08/21/19 0745 08/21/19 0800 08/21/19 0830  BP: 98/69 96/73 93/66  99/72  Pulse: (!) 149 (!) 148 (!) 147 (!) 148  Resp: 16 14 15 13   Temp:      TempSrc:      SpO2: 98% 100% 97% 97%  Weight:      Height:      PainSc:        Isolation Precautions No active isolations  Medications Medications  sodium chloride flush (NS) 0.9 % injection 3 mL (3 mLs Intravenous Not Given 08/20/19 1221)  diltiazem (CARDIZEM) 100 mg in dextrose 5% 162mL (1 mg/mL) infusion (0 mg/hr Intravenous Stopped 08/21/19 0751)  heparin ADULT infusion 100 units/mL (25000 units/238mL sodium chloride 0.45%) (1,300 Units/hr Intravenous New Bag/Given 08/20/19 1559)  acetaminophen (TYLENOL) tablet 650 mg (has no administration in time range)    Or  acetaminophen (TYLENOL) suppository 650 mg (has no administration in time range)  ondansetron (ZOFRAN) tablet 4 mg (has no administration in time range)    Or  ondansetron (ZOFRAN) injection 4 mg (has no administration in time range)  insulin aspart (novoLOG) injection 0-5 Units (has no administration in time range)  insulin glargine (LANTUS) injection 20 Units (has no administration in time range)  0.9 %  sodium chloride infusion (has no administration in time range)  sodium chloride 0.9 % bolus 1,500 mL (has no administration in time range)  insulin aspart (novoLOG) injection  0-15 Units (has no administration in time range)  diltiazem (CARDIZEM) injection 20 mg (20 mg Intravenous Given 08/20/19 1202)  sodium chloride 0.9 % bolus 1,000 mL (0 mLs Intravenous Stopped 08/20/19 1347)  diltiazem (CARDIZEM) injection 10 mg (10 mg Intravenous Given 08/20/19 1452)  heparin bolus via infusion 4,000 Units (4,000 Units Intravenous Bolus from Bag 08/20/19 1604)  sodium chloride 0.9 % bolus 1,000 mL (0 mLs Intravenous Stopped 08/21/19 0351)    Mobility walks     Focused Assessments Cardiac Assessment Handoff:    No results found for: CKTOTAL, CKMB, CKMBINDEX, TROPONINI No results found for: DDIMER Does the Patient currently have chest pain? No      R Recommendations: See Admitting Provider Note  Report given to:   Additional Notes:

## 2019-08-21 NOTE — ED Notes (Signed)
Pt has no complaints or pain at this time.

## 2019-08-21 NOTE — Progress Notes (Signed)
Pt wished to discuss DNR,  He basically does not want to be on machines no intubation or ventilator. He is agreeable to brief cardiac and resp. Resuscitation.   His wife was in room.  He had briefly discussed with out consult yesterday as well.

## 2019-08-21 NOTE — ED Notes (Signed)
Report given to Otila Kluver, RN on 6E

## 2019-08-21 NOTE — Progress Notes (Signed)
DAILY PROGRESS NOTE   Patient Name: Cameron Arroyo Date of Encounter: 08/21/2019 Cardiologist: Pixie Casino, MD  Chief Complaint   Feels better today  Patient Profile   Cameron Arroyo is a 68 y.o. male with a hx of cervical disc disease with surgery and asthma  who is being seen today for the evaluation of atrial flutter at the request of Dr. Sedonia Small, pt presented for freq urination  Subjective   Remains in atrial flutter with RVR around 150. Had become hypotensive on cardizem with no real effect on HR. On IV heparin with plans for possible TEE/DCCV - troponin has been negative. TSH is normal. HBA1c is 11.5.   Objective   Vitals:   08/21/19 0745 08/21/19 0800 08/21/19 0830 08/21/19 0900  BP: 96/73 93/66 99/72  90/67  Pulse: (!) 148 (!) 147 (!) 148 (!) 148  Resp: 14 15 13 16   Temp:      TempSrc:      SpO2: 100% 97% 97% 97%  Weight:      Height:        Intake/Output Summary (Last 24 hours) at 08/21/2019 1011 Last data filed at 08/21/2019 0751 Gross per 24 hour  Intake 1211.49 ml  Output --  Net 1211.49 ml   Filed Weights   08/20/19 1500  Weight: 89.8 kg    Physical Exam   General appearance: alert and no distress Neck: no carotid bruit, no JVD and thyroid not enlarged, symmetric, no tenderness/mass/nodules Lungs: clear to auscultation bilaterally Heart: regular tachycardia Abdomen: soft, non-tender; bowel sounds normal; no masses,  no organomegaly Extremities: extremities normal, atraumatic, no cyanosis or edema Pulses: 2+ and symmetric Skin: Skin color, texture, turgor normal. No rashes or lesions Neurologic: Grossly normal Psych: Pleasant  Inpatient Medications    Scheduled Meds:  insulin aspart  0-15 Units Subcutaneous TID WC   insulin aspart  0-5 Units Subcutaneous QHS   insulin glargine  20 Units Subcutaneous QHS   sodium chloride flush  3 mL Intravenous Once   sodium chloride flush  3 mL Intravenous Q12H    Continuous Infusions:  sodium  chloride     [START ON 08/22/2019] sodium chloride     sodium chloride     diltiazem (CARDIZEM) infusion Stopped (08/21/19 0751)   heparin 1,300 Units/hr (08/20/19 1559)    PRN Meds: acetaminophen **OR** acetaminophen, ondansetron **OR** ondansetron (ZOFRAN) IV, sodium chloride flush   Labs   Results for orders placed or performed during the hospital encounter of 08/20/19 (from the past 48 hour(s))  Basic metabolic panel     Status: Abnormal   Collection Time: 08/20/19 11:32 AM  Result Value Ref Range   Sodium 137 135 - 145 mmol/L   Potassium 4.9 3.5 - 5.1 mmol/L   Chloride 102 98 - 111 mmol/L   CO2 24 22 - 32 mmol/L   Glucose, Bld 430 (H) 70 - 99 mg/dL   BUN 21 8 - 23 mg/dL   Creatinine, Ser 0.97 0.61 - 1.24 mg/dL   Calcium 9.3 8.9 - 10.3 mg/dL   GFR calc non Af Amer >60 >60 mL/min   GFR calc Af Amer >60 >60 mL/min   Anion gap 11 5 - 15    Comment: Performed at [facility], [place] [address] [place], [place] [address]  CBC     Status: Abnormal   Collection Time: 08/20/19 11:32 AM  Result Value Ref Range   WBC 12.7 (H) 4.0 - 10.5 K/uL   RBC  5.20 4.22 - 5.81 MIL/uL   Hemoglobin 16.9 13.0 - 17.0 g/dL   HCT 49.0 39.0 - 52.0 %   MCV 94.2 80.0 - 100.0 fL   MCH 32.5 26.0 - 34.0 pg   MCHC 34.5 30.0 - 36.0 g/dL   RDW 12.2 11.5 - 15.5 %   Platelets 214 150 - 400 K/uL   nRBC 0.0 0.0 - 0.2 %    Comment: Performed at [facility], [place] [address], [place], [place] [address]  Troponin I (High Sensitivity)     Status: None   Collection Time: 08/20/19 11:32 AM  Result Value Ref Range   Troponin I (High Sensitivity) 14 <18 ng/L    Comment: (NOTE) Elevated high sensitivity troponin I (hsTnI) values and significant  changes across serial measurements may suggest ACS but many other  chronic and acute conditions are known to elevate hsTnI results.  Refer to the "Links" section for chest pain algorithms and additional  guidance. Performed at [facility], [place] [address] [place], [place] [address]   Magnesium     Status: Abnormal   Collection Time: 08/20/19 11:32 AM  Result Value Ref Range   Magnesium 2.5 (H) 1.7 - 2.4 mg/dL    Comment: Performed at [facility] [address], [place], [place] [address]  Hemoglobin A1c     Status: Abnormal   Collection Time: 08/20/19 11:32 AM  Result Value Ref Range   Hgb A1c MFr Bld 11.5 (H) 4.8 - 5.6 %    Comment: (NOTE) Pre diabetes:          5.7%-6.4% Diabetes:              >6.4% Glycemic control for   <7.0% adults with diabetes    Mean Plasma Glucose 283.35 mg/dL    Comment: Performed at [facility] [address] [place], [place] [address]  SARS CORONAVIRUS 2 (TAT 6-24 HRS) Nasopharyngeal Nasopharyngeal Swab     Status: None   Collection Time: 08/20/19 11:51 AM   Specimen: Nasopharyngeal Swab  Result Value Ref Range   SARS Coronavirus 2 NEGATIVE NEGATIVE    Comment: (NOTE) SARS-CoV-2 target nucleic acids are NOT DETECTED. The SARS-CoV-2 RNA is generally detectable in upper and lower respiratory specimens during the acute phase of infection. Negative results do not preclude SARS-CoV-2 infection, do not rule out co-infections with other pathogens, and should not be used as the sole basis for treatment or other patient management decisions. Negative results must be combined with clinical observations, patient history, and epidemiological information. The expected result is Negative. Fact Sheet for Patients: SugarRoll.be Fact Sheet for Healthcare Providers: https://www.woods-mathews.com/ This test is not yet approved or cleared by the [place] FDA and  has been authorized for detection and/or diagnosis of SARS-CoV-2 by FDA under an Emergency Use Authorization (EUA). This EUA will remain  in effect (meaning this test can be used) for the duration of the COVID-19 declaration under Section 56 4(b)(1) of the Act, 21 U.S.C. section  360bbb-3(b)(1), unless the authorization is terminated or revoked sooner. Performed at [facility], [place] [address] [place], [place] [address]   Troponin I (High Sensitivity)     Status: None   Collection Time: 08/20/19  4:12 PM  Result Value Ref Range   Troponin I (High Sensitivity) 14 <18 ng/L    Comment: (NOTE) Elevated high sensitivity troponin I (hsTnI) values and significant  changes across serial measurements may suggest ACS but  many other  chronic and acute conditions are known to elevate hsTnI results.  Refer to the "Links" section for chest pain algorithms and additional  guidance. Performed at [facility], [place] [address], [place], [place] [address]   TSH     Status: None   Collection Time: 08/20/19  4:12 PM  Result Value Ref Range   TSH 1.194 0.350 - 4.500 uIU/mL    Comment: Performed by a 3rd Generation assay with a functional sensitivity of <=0.01 uIU/mL. Performed at [facility], [place] [address] [place], [place] [address]   T4, free     Status: None   Collection Time: 08/20/19  4:12 PM  Result Value Ref Range   Free T4 1.05 0.61 - 1.12 ng/dL    Comment: (NOTE) Biotin ingestion may interfere with free T4 tests. If the results are inconsistent with the TSH level, previous test results, or the clinical presentation, then consider biotin interference. If needed, order repeat testing after stopping biotin. Performed at [facility], [place] [address] [place], [place] [address]   Urinalysis, Routine w reflex microscopic     Status: Abnormal   Collection Time: 08/20/19  9:00 PM  Result Value Ref Range   Color, Urine YELLOW YELLOW   APPearance CLEAR CLEAR   Specific Gravity, Urine 1.036 (H) 1.005 - 1.030   pH 5.0 5.0 - 8.0   Glucose, UA >=500 (A) NEGATIVE mg/dL   Hgb urine dipstick NEGATIVE NEGATIVE   Bilirubin Urine NEGATIVE NEGATIVE   Ketones, ur 80 (A) NEGATIVE mg/dL   Protein, ur NEGATIVE NEGATIVE mg/dL   Nitrite NEGATIVE NEGATIVE    Leukocytes,Ua NEGATIVE NEGATIVE   RBC / HPF 0-5 0 - 5 RBC/hpf   WBC, UA 0-5 0 - 5 WBC/hpf   Bacteria, UA RARE (A) NONE SEEN   Squamous Epithelial / LPF 0-5 0 - 5   Mucus PRESENT     Comment: Performed at [facility], [place] [address] [place], [place] [address]  Urine rapid drug screen (hosp performed)     Status: None   Collection Time: 08/20/19  9:00 PM  Result Value Ref Range   Opiates NONE DETECTED NONE DETECTED   Cocaine NONE DETECTED NONE DETECTED   Benzodiazepines NONE DETECTED NONE DETECTED   Amphetamines NONE DETECTED NONE DETECTED   Tetrahydrocannabinol NONE DETECTED NONE DETECTED   Barbiturates NONE DETECTED NONE DETECTED    Comment: (NOTE) DRUG SCREEN FOR MEDICAL PURPOSES ONLY.  IF CONFIRMATION IS NEEDED FOR ANY PURPOSE, NOTIFY LAB WITHIN 5 DAYS. LOWEST DETECTABLE LIMITS FOR URINE DRUG SCREEN Drug Class                     Cutoff (ng/mL) Amphetamine and metabolites    1000 Barbiturate and metabolites    200 Benzodiazepine                 [id number] Tricyclics and metabolites     300 Opiates and metabolites        300 Cocaine and metabolites        300 THC                            50 Performed at [facility], [place] [address], [place], [place] [address]   CBG monitoring, ED     Status: Abnormal   Collection Time: 08/20/19 11:40 PM  Result Value Ref Range   Glucose-Capillary 399 (  H) 70 - 99 mg/dL  CBC     Status: Abnormal   Collection Time: 08/21/19  2:04 AM  Result Value Ref Range   WBC 9.3 4.0 - 10.5 K/uL   RBC 4.31 4.22 - 5.81 MIL/uL   Hemoglobin 14.0 13.0 - 17.0 g/dL   HCT 41.4 39.0 - 52.0 %   MCV 96.1 80.0 - 100.0 fL   MCH 32.5 26.0 - 34.0 pg   MCHC 33.8 30.0 - 36.0 g/dL   RDW 12.3 11.5 - 15.5 %   Platelets 146 (L) 150 - 400 K/uL   nRBC 0.0 0.0 - 0.2 %    Comment: Performed at [facility], [place] [address] [place], [place] [address]  Comprehensive metabolic panel     Status: Abnormal   Collection Time: 08/21/19  2:04 AM  Result Value Ref  Range   Sodium 136 135 - 145 mmol/L   Potassium 4.0 3.5 - 5.1 mmol/L    Comment: DELTA CHECK NOTED   Chloride 105 98 - 111 mmol/L   CO2 23 22 - 32 mmol/L   Glucose, Bld 340 (H) 70 - 99 mg/dL   BUN 19 8 - 23 mg/dL   Creatinine, Ser 0.90 0.61 - 1.24 mg/dL   Calcium 7.9 (L) 8.9 - 10.3 mg/dL   Total Protein 5.2 (L) 6.5 - 8.1 g/dL   Albumin 3.0 (L) 3.5 - 5.0 g/dL   AST 15 15 - 41 U/L   ALT 21 0 - 44 U/L   Alkaline Phosphatase 66 38 - 126 U/L   Total Bilirubin 1.3 (H) 0.3 - 1.2 mg/dL   GFR calc non Af Amer >60 >60 mL/min   GFR calc Af Amer >60 >60 mL/min   Anion gap 8 5 - 15    Comment: Performed at [facility], [place] [address] [place], [place] [address]  Heparin level (unfractionated)     Status: None   Collection Time: 08/21/19  2:04 AM  Result Value Ref Range   Heparin Unfractionated 0.52 0.30 - 0.70 IU/mL    Comment: (NOTE) If heparin results are below expected values, and patient dosage has  been confirmed, suggest follow up testing of antithrombin III levels. Performed at [facility], [place] [address], [place], [place] [address]   CBG monitoring, ED     Status: Abnormal   Collection Time: 08/21/19  6:02 AM  Result Value Ref Range   Glucose-Capillary 302 (H) 70 - 99 mg/dL  CBG monitoring, ED     Status: Abnormal   Collection Time: 08/21/19  8:15 AM  Result Value Ref Range   Glucose-Capillary 282 (H) 70 - 99 mg/dL  Heparin level (unfractionated)     Status: None   Collection Time: 08/21/19  8:56 AM  Result Value Ref Range   Heparin Unfractionated 0.58 0.30 - 0.70 IU/mL    Comment: (NOTE) If heparin results are below expected values, and patient dosage has  been confirmed, suggest follow up testing of antithrombin III levels. Performed at [facility], [place] [address] [place], [place] [address]     ECG   Atrial flutter (typical) with 2:1 AV Block - Personally Reviewed  Telemetry   Atrial flutter - Personally Reviewed  Radiology    Dg Chest  Port 1 View  Result Date: 08/20/2019 CLINICAL DATA:  Shortness of breath on exertion with tachycardia, history of asthma. EXAM: PORTABLE CHEST 1 VIEW COMPARISON:  None. FINDINGS: The heart size and mediastinal contours are  within normal limits. Both lungs are clear. Signs of previous lower cervical spinal fusion are partially imaged in the upper portion of this radiograph. IMPRESSION: No active disease. Electronically Signed   By: Zetta Bills M.D.   On: 08/20/2019 12:27    Cardiac Studies   Echo pending  Assessment   1. Principal Problem: 2.   Atrial flutter ([place]) 3. Active Problems: 4.   Leukocytosis 5.   Hyperglycemia 6.   Hypermagnesemia 7.   Unintentional weight loss 8.   Polyuria 9.   Plan   1. Mr. Savio persists in typical atrial flutter which was not-rate controlled on cardizem - has become hypotensive and cardizem was stopped. He will likely need TEE-guided DCCV. Since troponins are negative, will transition heparin to Eliquis in preparation for TEE/DCCV tomorrow. Will need continued improvement of hyperglycemia and underlying risk factors to ensure optimal conversion.  Time Spent Directly with Patient:  I have spent a total of 35 minutes with the patient reviewing hospital notes, telemetry, EKGs, labs and examining the patient as well as establishing an assessment and plan that was discussed personally with the patient.  > 50% of time was spent in direct patient care.  Length of Stay:  LOS: 0 days   Pixie Casino, MD, [facility], [place] Director of the Advanced Lipid Disorders &  Cardiovascular Risk Reduction Clinic Diplomate of the American Board of Clinical Lipidology Attending Cardiologist  Direct Dial: [phone]   Fax: [phone]  Website:  [email or web address] Meli Faley 08/21/2019, 10:11 AM

## 2019-08-21 NOTE — ED Notes (Signed)
PAGED SCHORR TO RN MIKE--Thu Baggett

## 2019-08-21 NOTE — Progress Notes (Signed)
   08/21/19 1322  Vitals  Temp (!) 97.4 F (36.3 C)  Temp Source Oral  BP 92/69  MAP (mmHg) 77  BP Method Automatic  Pulse Rate (!) 146  Oxygen Therapy  SpO2 98 %  MEWS Score  MEWS RR 0  MEWS Pulse 3  MEWS Systolic 1  MEWS LOC 0  MEWS Temp 0  MEWS Score 4  MEWS Score Color Red   MD Hilty on unit and notified about post BP and HR after lopressor given this am (please see MAR) ordered parameters listed in MAR. I will continue to monitor the patient closely.   Saddie Benders RN

## 2019-08-21 NOTE — Progress Notes (Signed)
[facility] Progress Note  Subjective: denies any palp, CP or SOB  Vitals:   08/21/19 0700 08/21/19 0715 08/21/19 0730 08/21/19 0745  BP: 103/74 100/66 98/69 96/73   Pulse: (!) 148 (!) 149 (!) 149 (!) 148  Resp: 18 15 16 14   Temp:      TempSrc:      SpO2: 93% 97% 98% 100%  Weight:      Height:        Inpatient medications: . sodium chloride flush  3 mL Intravenous Once   . diltiazem (CARDIZEM) infusion Stopped (08/21/19 0751)  . heparin 1,300 Units/hr (08/20/19 1559)   acetaminophen **OR** acetaminophen, ondansetron **OR** ondansetron (ZOFRAN) IV  Exam:  Alert, no distress  No jvd   Chest cta bilat no rales or wheezing   Cor tachy, reg no mrg    ABd no ascites or hsm, soft ntnd    Ext no edema or joint changes    NF, Ox3   HPI: Cameron Arroyo is a 68 y.o. male with no significant past medical history came to the emergency department with multiple problems.  He reports that he feels like crap, polyuria, dry mouth, decreased appetite, weight loss, shortness of breath, anxiety, no energy since 2 to 3 weeks.  He went to [facility] health urgent care initially where he was found to have atrial flutter with rapid response and was advised to go to the emergency department for further evaluation and management.  He reports shortness of breath but denies association with chest pain, palpitation, headache, blurry vision, lightheadedness, dizziness, nausea, vomiting, epigastric pain, diarrhea, constipation, fever, chills, decreased appetite, night sweats, family history of thyroid issues, colon cancer, hematemesis, melena, over-the-counter use of NSAIDs. Reports unintentional weight loss of 30 pounds in the last 1 year.  He is a former smoker however denies alcohol, illicit drug use.  He was prescribed steroids by his PCP recently for left knee pain.  Patient reports that his left knee pain is better. ED Course: Patient was found to have rapid narrow complex tachycardia suggestive of atrial  flutter.  Hyperglycemia of blood glucose 430, and leukocytosis of 12.7.  Initial troponin x1-.  Magnesium level elevated at 2.5.  Chest x-ray came back negative.  COVID-19: Pending.  CBC: H&H is stable.  Kidney function: WNL.[name] Kitchen  Started on IV diltiazem and IV heparin.  Transthoracic echo was ordered which was not performed due to patient's tachycardia.       Hospital Course:  Hyperglycemia/ new onset diab mellitus: Blood sugar more than 400 upon arrival. -Patient presented with polyuria, dry mouth, weight loss, fatigue -will start Lantus 20u / day and SSI - volume repletion, hypotension may be his normal vs vol depletion from polyuria - consult diabetes team for education  Atrial flutter: -Patient asymptomatic regarding arrhythmia -Patient heart rates more than 150s - trop neg and CXR negative - Thyroid function study has been ordered - seen by cardiology, on IV heparin and IV diltiazem now, may need TEE cardioversion if he does not convert. -Transthoracic echo pending  Leukocytosis: -Likely secondary to recent steroid use.  Patient is afebrile, no signs of sepsis, chest x-ray is negative -No indication of antibiotic  Unintentional weight loss: -30 pounds in 1 year. -Could be secondary to new onset diabetes versus thyroid problems? -TSH, T3, T4, A1c ordered and is pending, we will also check HIV.  Hypermagnesemia: -Monitor magnesium level. -On telemetry.     Rob Doctor, hospital / [facility] [phone] 08/21/2019, 8:40 AM   Recent Labs  Lab 08/20/19 1132 08/21/19 0204  NA 137 136  K 4.9 4.0  CL 102 105  CO2 24 23  GLUCOSE 430* 340*  BUN 21 19  CREATININE 0.97 0.90  CALCIUM 9.3 7.9*   Recent Labs  Lab 08/21/19 0204  AST 15  ALT 21  ALKPHOS 66  BILITOT 1.3*  PROT 5.2*  ALBUMIN 3.0*   Recent Labs  Lab 08/20/19 1132 08/21/19 0204  WBC 12.7* 9.3  HGB 16.9 14.0  HCT 49.0 41.4  MCV 94.2 96.1  PLT 214 146*   Iron/TIBC/Ferritin/ %Sat No results found for: IRON,  TIBC, FERRITIN, IRONPCTSAT

## 2019-08-22 ENCOUNTER — Inpatient Hospital Stay (HOSPITAL_COMMUNITY): Payer: 59

## 2019-08-22 ENCOUNTER — Encounter (HOSPITAL_COMMUNITY): Payer: Self-pay

## 2019-08-22 ENCOUNTER — Inpatient Hospital Stay (HOSPITAL_COMMUNITY): Payer: 59 | Admitting: Anesthesiology

## 2019-08-22 ENCOUNTER — Encounter (HOSPITAL_COMMUNITY)
Admission: EM | Disposition: A | Discharge status: Home / Self Care | Payer: Self-pay | Source: Home / Self Care | Attending: Nephrology

## 2019-08-22 DIAGNOSIS — I4892 Unspecified atrial flutter: Secondary | ICD-10-CM

## 2019-08-22 DIAGNOSIS — E119 Type 2 diabetes mellitus without complications: Secondary | ICD-10-CM

## 2019-08-22 HISTORY — PX: TEE WITHOUT CARDIOVERSION: SHX5443

## 2019-08-22 HISTORY — PX: CARDIOVERSION: SHX1299

## 2019-08-22 LAB — CBC WITH DIFFERENTIAL/PLATELET
Abs Immature Granulocytes: 0.03 10*3/uL (ref 0.00–0.07)
Basophils Absolute: 0 10*3/uL (ref 0.0–0.1)
Basophils Relative: 0 %
Eosinophils Absolute: 0.1 10*3/uL (ref 0.0–0.5)
Eosinophils Relative: 2 %
HCT: 39.1 % (ref 39.0–52.0)
Hemoglobin: 13.3 g/dL (ref 13.0–17.0)
Immature Granulocytes: 0 %
Lymphocytes Relative: 42 %
Lymphs Abs: 3 10*3/uL (ref 0.7–4.0)
MCH: 32.7 pg (ref 26.0–34.0)
MCHC: 34 g/dL (ref 30.0–36.0)
MCV: 96.1 fL (ref 80.0–100.0)
Monocytes Absolute: 0.4 10*3/uL (ref 0.1–1.0)
Monocytes Relative: 6 %
Neutro Abs: 3.7 10*3/uL (ref 1.7–7.7)
Neutrophils Relative %: 50 %
Platelets: 131 10*3/uL — ABNORMAL LOW (ref 150–400)
RBC: 4.07 MIL/uL — ABNORMAL LOW (ref 4.22–5.81)
RDW: 12.3 % (ref 11.5–15.5)
WBC: 7.3 10*3/uL (ref 4.0–10.5)
nRBC: 0 % (ref 0.0–0.2)

## 2019-08-22 LAB — GLUCOSE, CAPILLARY
Glucose-Capillary: 206 mg/dL — ABNORMAL HIGH (ref 70–99)
Glucose-Capillary: 124 mg/dL — ABNORMAL HIGH (ref 70–99)
Glucose-Capillary: 197 mg/dL — ABNORMAL HIGH (ref 70–99)
Glucose-Capillary: 254 mg/dL — ABNORMAL HIGH (ref 70–99)

## 2019-08-22 LAB — ECHOCARDIOGRAM COMPLETE
Height: 72 in
Weight: 3017.6 oz

## 2019-08-22 LAB — PROTIME-INR
INR: 1.1 (ref 0.8–1.2)
Prothrombin Time: 14.3 seconds (ref 11.4–15.2)

## 2019-08-22 LAB — BASIC METABOLIC PANEL
Anion gap: 7 (ref 5–15)
BUN: 18 mg/dL (ref 8–23)
CO2: 23 mmol/L (ref 22–32)
Calcium: 7.8 mg/dL — ABNORMAL LOW (ref 8.9–10.3)
Chloride: 107 mmol/L (ref 98–111)
Creatinine, Ser: 0.76 mg/dL (ref 0.61–1.24)
GFR calc Af Amer: >60 mL/min (ref >60)
GFR calc non Af Amer: >60 mL/min (ref >60)
Glucose, Bld: 155 mg/dL — ABNORMAL HIGH (ref 70–99)
Potassium: 3.5 mmol/L (ref 3.5–5.1)
Sodium: 137 mmol/L (ref 135–145)

## 2019-08-22 LAB — C-PEPTIDE: C-Peptide: 1.8 ng/mL (ref 1.1–4.4)

## 2019-08-22 SURGERY — ECHOCARDIOGRAM, TRANSESOPHAGEAL
Anesthesia: General

## 2019-08-22 MED ORDER — PROPOFOL 500 MG/50ML IV EMUL
INTRAVENOUS | Status: DC | PRN
  Administered 2019-08-22: 100 ug/kg/min via INTRAVENOUS

## 2019-08-22 MED ORDER — EPHEDRINE SULFATE-NACL 50-0.9 MG/10ML-% IV SOSY
PREFILLED_SYRINGE | INTRAVENOUS | Status: DC | PRN
  Administered 2019-08-22: 15 mg via INTRAVENOUS
  Administered 2019-08-22: 5 mg via INTRAVENOUS
  Administered 2019-08-22 (×2): 15 mg via INTRAVENOUS

## 2019-08-22 MED ORDER — MIDAZOLAM HCL 2 MG/2ML IJ SOLN
INTRAMUSCULAR | Status: DC | PRN
  Administered 2019-08-22: 2 mg via INTRAVENOUS

## 2019-08-22 MED ORDER — PHENYLEPHRINE 40 MCG/ML (10ML) SYRINGE FOR IV PUSH (FOR BLOOD PRESSURE SUPPORT)
PREFILLED_SYRINGE | INTRAVENOUS | Status: DC | PRN
  Administered 2019-08-22: 120 ug via INTRAVENOUS

## 2019-08-22 MED ORDER — PROPOFOL 10 MG/ML IV BOLUS
INTRAVENOUS | Status: DC | PRN
  Administered 2019-08-22: 15 mg via INTRAVENOUS
  Administered 2019-08-22: 10 mg via INTRAVENOUS
  Administered 2019-08-22: 15 mg via INTRAVENOUS

## 2019-08-22 MED ORDER — BUTAMBEN-TETRACAINE-BENZOCAINE 2-2-14 % EX AERO
INHALATION_SPRAY | CUTANEOUS | Status: DC | PRN
  Administered 2019-08-22: 2 via TOPICAL

## 2019-08-22 NOTE — Progress Notes (Signed)
Echocardiogram 2D Echocardiogram has been performed.  Oneal Deputy Pamlea Finder 08/22/2019, 2:34 PM

## 2019-08-22 NOTE — Anesthesia Postprocedure Evaluation (Signed)
Anesthesia Post Note  Patient: Cameron Arroyo  Procedure(s) Performed: TRANSESOPHAGEAL ECHOCARDIOGRAM (TEE) (N/A ) CARDIOVERSION (N/A )     Patient location during evaluation: PACU Anesthesia Type: General Level of consciousness: awake and alert and oriented Pain management: pain level controlled Vital Signs Assessment: post-procedure vital signs reviewed and stable Respiratory status: spontaneous breathing, nonlabored ventilation and respiratory function stable Cardiovascular status: blood pressure returned to baseline and stable Postop Assessment: no apparent nausea or vomiting Anesthetic complications: no    Last Vitals:  Vitals:   08/22/19 1137 08/22/19 1145  BP: 140/84 136/73  Pulse: 68 78  Resp: 18 12  Temp:    SpO2: 96% 98%    Last Pain:  Vitals:   08/22/19 1145  TempSrc:   PainSc: 0-No pain                 Kasey Hansell A.

## 2019-08-22 NOTE — Progress Notes (Signed)
Transitions of Care Pharmacist Note  Cameron Arroyo is a 68 y.o. male that has been diagnosed with atrial flutter s/p TEE/DCCV and will be prescribed Eliquis (apixaban) at discharge.   Patient Education: I provided the following education on Apixban to the patient: How to take the medication Described what the medication is Signs of bleeding Signs/symptoms of stroke   Discharge Medications Plan: The patient wants to have their discharge medications filled by the Transitions of Care pharmacy rather than their usual pharmacy.  The primary doctor for this patient has been contacted to send all discharge medication prescriptions to the Transitions of Care pharmacy, the discharge orders pharmacy has been changed to the Transitions of Care pharmacy, the patient will receive a phone call regarding co-pay, and their medications will be delivered by the Transitions of Care pharmacy.   Insurance information: BIN: [id number] PCN: [id number] Group number: [id number]  Member ID number: [id number]  Thank you,   Sherren Kerns, PharmD PGY1 Acute Care Pharmacy Resident August 22, 2019

## 2019-08-22 NOTE — Anesthesia Preprocedure Evaluation (Addendum)
Anesthesia Evaluation  Patient identified by MRN, date of birth, ID band Patient awake    Reviewed: Allergy & Precautions, NPO status , Patient's Chart, lab work & pertinent test results, reviewed documented beta blocker date and time   Airway Mallampati: II  TM Distance: >3 FB Neck ROM: Full    Dental  (+) Poor Dentition   Pulmonary asthma , former smoker,    Pulmonary exam normal breath sounds clear to auscultation       Cardiovascular hypertension, Pt. on medications + dysrhythmias Atrial Fibrillation  Rhythm:Regular Rate:Tachycardia     Neuro/Psych negative neurological ROS  negative psych ROS   GI/Hepatic negative GI ROS, Neg liver ROS,   Endo/Other  diabetes, Poorly Controlled, Type 2New onset DM  Renal/GU   negative genitourinary   Musculoskeletal negative musculoskeletal ROS (+)   Abdominal   Peds  Hematology Eliquis- last dose this am Thrombocytopenia-mild   Anesthesia Other Findings   Reproductive/Obstetrics                            Anesthesia Physical Anesthesia Plan  ASA: III  Anesthesia Plan: General   Post-op Pain Management:    Induction: Intravenous  PONV Risk Score and Plan: 2 and Ondansetron and Treatment may vary due to age or medical condition  Airway Management Planned: Natural Airway and Mask  Additional Equipment:   Intra-op Plan:   Post-operative Plan:   Informed Consent: I have reviewed the patients History and Physical, chart, labs and discussed the procedure including the risks, benefits and alternatives for the proposed anesthesia with the patient or authorized representative who has indicated his/her understanding and acceptance.     Dental advisory given  Plan Discussed with: CRNA and Surgeon  Anesthesia Plan Comments:         Anesthesia Quick Evaluation

## 2019-08-22 NOTE — Anesthesia Procedure Notes (Signed)
Procedure Name: MAC Date/Time: 08/22/2019 11:12 AM Performed by: Elayne Snare, CRNA Pre-anesthesia Checklist: Patient identified, Emergency Drugs available, Suction available and Patient being monitored Patient Re-evaluated:Patient Re-evaluated prior to induction Oxygen Delivery Method: Nasal cannula

## 2019-08-22 NOTE — Transfer of Care (Signed)
Immediate Anesthesia Transfer of Care Note  Patient: Cameron Arroyo  Procedure(s) Performed: TRANSESOPHAGEAL ECHOCARDIOGRAM (TEE) (N/A ) CARDIOVERSION (N/A )  Patient Location: Endoscopy Unit  Anesthesia Type:General  Level of Consciousness: drowsy and responds to stimulation  Airway & Oxygen Therapy: Patient Spontanous Breathing and Patient connected to nasal cannula oxygen  Post-op Assessment: Report given to RN and Post -op Vital signs reviewed and stable  Post vital signs: Reviewed and stable  Last Vitals:  Vitals Value Taken Time  BP 140/84 08/22/19 1137  Temp    Pulse 65 08/22/19 1137  Resp    SpO2 97 % 08/22/19 1137  Vitals shown include unvalidated device data.  Last Pain:  Vitals:   08/22/19 1058  TempSrc: Oral  PainSc: 0-No pain         Complications: No apparent anesthesia complications

## 2019-08-22 NOTE — Interval H&P Note (Signed)
History and Physical Interval Note:  08/22/2019 11:06 AM  Cameron Arroyo  has presented today for surgery, with the diagnosis of AFIB.  The various methods of treatment have been discussed with the patient and family. After consideration of risks, benefits and other options for treatment, the patient has consented to  Procedure(s): TRANSESOPHAGEAL ECHOCARDIOGRAM (TEE) (N/A) CARDIOVERSION (N/A) as a surgical intervention.  The patient's history has been reviewed, patient examined, no change in status, stable for surgery.  I have reviewed the patient's chart and labs.  Questions were answered to the patient's satisfaction.     Donato Heinz

## 2019-08-22 NOTE — Progress Notes (Addendum)
DAILY PROGRESS NOTE   Arroyo Name: Cameron Arroyo Date of Encounter: 08/22/2019 Cardiologist: Pixie Casino, MD  Chief Complaint   Atrial flutter  Arroyo Profile   Cameron Arroyo is a 68 y.o. male with a hx of cervical disc disease with surgery and asthma  who is being seen for atrial flutter at Cameron request of Dr. Sedonia Small, pt presented 09/21 for freq urination  Subjective   Pt upset about diabetes dx, struggling with this. Otherwise, feels fine   Objective   Vitals:   08/22/19 0429 08/22/19 0529 08/22/19 0536 08/22/19 0537  BP: 93/79 114/88    Pulse:      Resp:      Temp:    97.6 F (36.4 C)  TempSrc:    Oral  SpO2:  97%    Weight:   85.5 kg   Height:        Intake/Output Summary (Last 24 hours) at 08/22/2019 [id number] Last data filed at 08/21/2019 2100 Gross per 24 hour  Intake 2338.75 ml  Output 650 ml  Net 1688.75 ml   Filed Weights   08/20/19 1500 08/21/19 1038 08/22/19 0536  Weight: 89.8 kg 83.6 kg 85.5 kg    Physical Exam   General appearance: alert and no distress Neck: no carotid bruit, no JVD and thyroid not enlarged, symmetric, no tenderness/mass/nodules Lungs: clear to auscultation bilaterally Heart: irregularly irregular rhythm and regular tachycardia Abdomen: soft, non-tender; bowel sounds normal; no masses,  no organomegaly Extremities: extremities normal, atraumatic, no cyanosis or edema Pulses: 2+ and symmetric Skin: Skin color, texture, turgor normal. No rashes or lesions Neurologic: Grossly normal Psych: Pleasant  Inpatient Medications    Scheduled Meds: . apixaban  5 mg Oral BID  . insulin aspart  0-15 Units Subcutaneous TID WC  . insulin aspart  0-5 Units Subcutaneous QHS  . insulin glargine  20 Units Subcutaneous Daily  . metoprolol tartrate  12.5 mg Oral BID  . sodium chloride flush  3 mL Intravenous Once  . sodium chloride flush  3 mL Intravenous Q12H    Continuous Infusions: . sodium chloride 75 mL/hr at 08/21/19 1143  .  sodium chloride    . sodium chloride    . diltiazem (CARDIZEM) infusion Stopped (08/21/19 0751)    PRN Meds: acetaminophen **OR** acetaminophen, ondansetron **OR** ondansetron (ZOFRAN) IV, sodium chloride flush   Labs   Results for orders placed or performed during Cameron hospital encounter of 08/20/19 (from Cameron past 48 hour(s))  Basic metabolic panel     Status: Abnormal   Collection Time: 08/20/19 11:32 AM  Result Value Ref Range   Sodium 137 135 - 145 mmol/L   Potassium 4.9 3.5 - 5.1 mmol/L   Chloride 102 98 - 111 mmol/L   CO2 24 22 - 32 mmol/L   Glucose, Bld 430 (H) 70 - 99 mg/dL   BUN 21 8 - 23 mg/dL   Creatinine, Ser 0.97 0.61 - 1.24 mg/dL   Calcium 9.3 8.9 - 10.3 mg/dL   GFR calc non Af Amer >60 >60 mL/min   GFR calc Af Amer >60 >60 mL/min   Anion gap 11 5 - 15    Comment: Performed at [facility], [place] [address], [place] [address]  CBC     Status: Abnormal   Collection Time: 08/20/19 11:32 AM  Result Value Ref Range   WBC 12.7 (H) 4.0 - 10.5 K/uL   RBC 5.20 4.22 - 5.81 MIL/uL   Hemoglobin  16.9 13.0 - 17.0 g/dL   HCT 49.0 39.0 - 52.0 %   MCV 94.2 80.0 - 100.0 fL   MCH 32.5 26.0 - 34.0 pg   MCHC 34.5 30.0 - 36.0 g/dL   RDW 12.2 11.5 - 15.5 %   Platelets 214 150 - 400 K/uL   nRBC 0.0 0.0 - 0.2 %    Comment: Performed at [facility], [place] [address] [place], [place] [address]  Troponin I (High Sensitivity)     Status: None   Collection Time: 08/20/19 11:32 AM  Result Value Ref Range   Troponin I (High Sensitivity) 14 <18 ng/L    Comment: (NOTE) Elevated high sensitivity troponin I (hsTnI) values and significant  changes across serial measurements may suggest ACS but many other  chronic and acute conditions are known to elevate hsTnI results.  Refer to Cameron "Links" section for chest pain algorithms and additional  guidance. Performed at [facility], [place] [address], [place], [place] [address]   Magnesium     Status: Abnormal    Collection Time: 08/20/19 11:32 AM  Result Value Ref Range   Magnesium 2.5 (H) 1.7 - 2.4 mg/dL    Comment: Performed at [facility] [address] [place] [address], [place], [place] [address]  Hemoglobin A1c     Status: Abnormal   Collection Time: 08/20/19 11:32 AM  Result Value Ref Range   Hgb A1c MFr Bld 11.5 (H) 4.8 - 5.6 %    Comment: (NOTE) Pre diabetes:          5.7%-6.4% Diabetes:              >6.4% Glycemic control for   <7.0% adults with diabetes    Mean Plasma Glucose 283.35 mg/dL    Comment: Performed at [facility] [address] [place], [place] [address]  SARS CORONAVIRUS 2 (TAT 6-24 HRS) Nasopharyngeal Nasopharyngeal Swab     Status: None   Collection Time: 08/20/19 11:51 AM   Specimen: Nasopharyngeal Swab  Result Value Ref Range   SARS Coronavirus 2 NEGATIVE NEGATIVE    Comment: (NOTE) SARS-CoV-2 target nucleic acids are NOT DETECTED. Cameron SARS-CoV-2 RNA is generally detectable in upper and lower respiratory specimens during Cameron acute phase of infection. Negative results do not preclude SARS-CoV-2 infection, do not rule out co-infections with other pathogens, and should not be used as Cameron sole basis for treatment or other Arroyo management decisions. Negative results must be combined with clinical observations, Arroyo history, and epidemiological information. Cameron expected result is Negative. Fact Sheet for Patients: SugarRoll.be Fact Sheet for Healthcare Providers: https://www.woods-mathews.com/ This test is not yet approved or cleared by Cameron [place] FDA and  has been authorized for detection and/or diagnosis of SARS-CoV-2 by FDA under an Emergency Use Authorization (EUA). This EUA will remain  in effect (meaning this test can be used) for Cameron duration of Cameron COVID-19 declaration under Section 56 4(b)(1) of Cameron Act, 21 U.S.C. section 360bbb-3(b)(1), unless Cameron authorization is terminated or revoked sooner.  Performed at [facility], [place] [address], [place], [place] [address]   Troponin I (High Sensitivity)     Status: None   Collection Time: 08/20/19  4:12 PM  Result Value Ref Range   Troponin I (High Sensitivity) 14 <18 ng/L    Comment: (NOTE) Elevated high sensitivity troponin I (hsTnI) values and significant  changes across serial measurements may suggest ACS but many other  chronic and acute conditions are  known to elevate hsTnI results.  Refer to Cameron "Links" section for chest pain algorithms and additional  guidance. Performed at [facility], [place] [address], [place], [place] [address]   TSH     Status: None   Collection Time: 08/20/19  4:12 PM  Result Value Ref Range   TSH 1.194 0.350 - 4.500 uIU/mL    Comment: Performed by a 3rd Generation assay with a functional sensitivity of <=0.01 uIU/mL. Performed at [facility], [place] [address], [place], [place] [address]   T4, free     Status: None   Collection Time: 08/20/19  4:12 PM  Result Value Ref Range   Free T4 1.05 0.61 - 1.12 ng/dL    Comment: (NOTE) Biotin ingestion may interfere with free T4 tests. If Cameron results are inconsistent with Cameron TSH level, previous test results, or Cameron clinical presentation, then consider biotin interference. If needed, order repeat testing after stopping biotin. Performed at [facility], [place] [address], [place], [place] [address]   Urinalysis, Routine w reflex microscopic     Status: Abnormal   Collection Time: 08/20/19  9:00 PM  Result Value Ref Range   Color, Urine YELLOW YELLOW   APPearance CLEAR CLEAR   Specific Gravity, Urine 1.036 (H) 1.005 - 1.030   pH 5.0 5.0 - 8.0   Glucose, UA >=500 (A) NEGATIVE mg/dL   Hgb urine dipstick NEGATIVE NEGATIVE   Bilirubin Urine NEGATIVE NEGATIVE   Ketones, ur 80 (A) NEGATIVE mg/dL   Protein, ur NEGATIVE NEGATIVE mg/dL   Nitrite NEGATIVE NEGATIVE   Leukocytes,Ua NEGATIVE NEGATIVE   RBC / HPF 0-5 0 - 5 RBC/hpf   WBC, UA  0-5 0 - 5 WBC/hpf   Bacteria, UA RARE (A) NONE SEEN   Squamous Epithelial / LPF 0-5 0 - 5   Mucus PRESENT     Comment: Performed at [facility], [place] [address] [place], [place] [address]  Urine rapid drug screen (hosp performed)     Status: None   Collection Time: 08/20/19  9:00 PM  Result Value Ref Range   Opiates NONE DETECTED NONE DETECTED   Cocaine NONE DETECTED NONE DETECTED   Benzodiazepines NONE DETECTED NONE DETECTED   Amphetamines NONE DETECTED NONE DETECTED   Tetrahydrocannabinol NONE DETECTED NONE DETECTED   Barbiturates NONE DETECTED NONE DETECTED    Comment: (NOTE) DRUG SCREEN FOR MEDICAL PURPOSES ONLY.  IF CONFIRMATION IS NEEDED FOR ANY PURPOSE, NOTIFY LAB WITHIN 5 DAYS. LOWEST DETECTABLE LIMITS FOR URINE DRUG SCREEN Drug Class                     Cutoff (ng/mL) Amphetamine and metabolites    1000 Barbiturate and metabolites    200 Benzodiazepine                 [id number] Tricyclics and metabolites     300 Opiates and metabolites        300 Cocaine and metabolites        300 THC                            50 Performed at [facility], [place] [address], [place], [place] [address]   CBG monitoring, ED     Status: Abnormal   Collection Time: 08/20/19 11:40 PM  Result Value Ref Range   Glucose-Capillary 399 (H) 70 - 99 mg/dL  CBC  Status: Abnormal   Collection Time: 08/21/19  2:04 AM  Result Value Ref Range   WBC 9.3 4.0 - 10.5 K/uL   RBC 4.31 4.22 - 5.81 MIL/uL   Hemoglobin 14.0 13.0 - 17.0 g/dL   HCT 41.4 39.0 - 52.0 %   MCV 96.1 80.0 - 100.0 fL   MCH 32.5 26.0 - 34.0 pg   MCHC 33.8 30.0 - 36.0 g/dL   RDW 12.3 11.5 - 15.5 %   Platelets 146 (L) 150 - 400 K/uL   nRBC 0.0 0.0 - 0.2 %    Comment: Performed at [facility], [place] [address] [place], [place] [address]  Comprehensive metabolic panel     Status: Abnormal   Collection Time: 08/21/19  2:04 AM  Result Value Ref Range   Sodium 136 135 - 145 mmol/L   Potassium 4.0 3.5 - 5.1 mmol/L     Comment: DELTA CHECK NOTED   Chloride 105 98 - 111 mmol/L   CO2 23 22 - 32 mmol/L   Glucose, Bld 340 (H) 70 - 99 mg/dL   BUN 19 8 - 23 mg/dL   Creatinine, Ser 0.90 0.61 - 1.24 mg/dL   Calcium 7.9 (L) 8.9 - 10.3 mg/dL   Total Protein 5.2 (L) 6.5 - 8.1 g/dL   Albumin 3.0 (L) 3.5 - 5.0 g/dL   AST 15 15 - 41 U/L   ALT 21 0 - 44 U/L   Alkaline Phosphatase 66 38 - 126 U/L   Total Bilirubin 1.3 (H) 0.3 - 1.2 mg/dL   GFR calc non Af Amer >60 >60 mL/min   GFR calc Af Amer >60 >60 mL/min   Anion gap 8 5 - 15    Comment: Performed at [facility], [place] [address], [place], [place] [address]  Heparin level (unfractionated)     Status: None   Collection Time: 08/21/19  2:04 AM  Result Value Ref Range   Heparin Unfractionated 0.52 0.30 - 0.70 IU/mL    Comment: (NOTE) If heparin results are below expected values, and Arroyo dosage has  been confirmed, suggest follow up testing of antithrombin III levels. Performed at [facility], [place] [address], [place], [place] [address]   CBG monitoring, ED     Status: Abnormal   Collection Time: 08/21/19  6:02 AM  Result Value Ref Range   Glucose-Capillary 302 (H) 70 - 99 mg/dL  CBG monitoring, ED     Status: Abnormal   Collection Time: 08/21/19  8:15 AM  Result Value Ref Range   Glucose-Capillary 282 (H) 70 - 99 mg/dL  C-peptide     Status: None   Collection Time: 08/21/19  8:56 AM  Result Value Ref Range   C-Peptide 1.8 1.1 - 4.4 ng/mL    Comment: (NOTE) C-Peptide reference interval is for fasting patients. Performed At: [facility] [address] [place], [place] [id number] Rush Farmer MD [id number]   Heparin level (unfractionated)     Status: None   Collection Time: 08/21/19  8:56 AM  Result Value Ref Range   Heparin Unfractionated 0.58 0.30 - 0.70 IU/mL    Comment: (NOTE) If heparin results are below expected values, and Arroyo dosage has  been confirmed, suggest follow up testing of antithrombin III levels.  Performed at [facility], [place] [address] [place], [place] [address]   HIV antibody (Routine Testing)     Status: None   Collection Time: 08/21/19  8:56 AM  Result Value  Ref Range   HIV Screen 4th Generation wRfx Non Reactive Non Reactive    Comment: (NOTE) Performed At: [facility] [place], [place] [id number] Rush Farmer MD [id number]   Glucose, capillary     Status: Abnormal   Collection Time: 08/21/19 10:51 AM  Result Value Ref Range   Glucose-Capillary 248 (H) 70 - 99 mg/dL  Glucose, capillary     Status: Abnormal   Collection Time: 08/21/19  3:30 PM  Result Value Ref Range   Glucose-Capillary 236 (H) 70 - 99 mg/dL  Glucose, capillary     Status: Abnormal   Collection Time: 08/21/19  8:45 PM  Result Value Ref Range   Glucose-Capillary 246 (H) 70 - 99 mg/dL  Basic metabolic panel     Status: Abnormal   Collection Time: 08/22/19  3:10 AM  Result Value Ref Range   Sodium 137 135 - 145 mmol/L   Potassium 3.5 3.5 - 5.1 mmol/L   Chloride 107 98 - 111 mmol/L   CO2 23 22 - 32 mmol/L   Glucose, Bld 155 (H) 70 - 99 mg/dL   BUN 18 8 - 23 mg/dL   Creatinine, Ser 0.76 0.61 - 1.24 mg/dL   Calcium 7.8 (L) 8.9 - 10.3 mg/dL   GFR calc non Af Amer >60 >60 mL/min   GFR calc Af Amer >60 >60 mL/min   Anion gap 7 5 - 15    Comment: Performed at [facility], [place] [address], [place], [place] [address]  CBC WITH DIFFERENTIAL     Status: Abnormal   Collection Time: 08/22/19  3:10 AM  Result Value Ref Range   WBC 7.3 4.0 - 10.5 K/uL   RBC 4.07 (L) 4.22 - 5.81 MIL/uL   Hemoglobin 13.3 13.0 - 17.0 g/dL   HCT 39.1 39.0 - 52.0 %   MCV 96.1 80.0 - 100.0 fL   MCH 32.7 26.0 - 34.0 pg   MCHC 34.0 30.0 - 36.0 g/dL   RDW 12.3 11.5 - 15.5 %   Platelets 131 (L) 150 - 400 K/uL    Comment: REPEATED TO VERIFY   nRBC 0.0 0.0 - 0.2 %   Neutrophils Relative % 50 %   Neutro Abs 3.7 1.7 - 7.7 K/uL   Lymphocytes Relative 42 %   Lymphs Abs 3.0 0.7 - 4.0 K/uL    Monocytes Relative 6 %   Monocytes Absolute 0.4 0.1 - 1.0 K/uL   Eosinophils Relative 2 %   Eosinophils Absolute 0.1 0.0 - 0.5 K/uL   Basophils Relative 0 %   Basophils Absolute 0.0 0.0 - 0.1 K/uL   Immature Granulocytes 0 %   Abs Immature Granulocytes 0.03 0.00 - 0.07 K/uL    Comment: Performed at [facility] [address] [place], [place] [address]  Protime-INR     Status: None   Collection Time: 08/22/19  3:10 AM  Result Value Ref Range   Prothrombin Time 14.3 11.4 - 15.2 seconds   INR 1.1 0.8 - 1.2    Comment: (NOTE) INR goal varies based on device and disease states. Performed at [facility], [place] [address], [place], [place] [address]     ECG   09/23 ECG is typical atrial flutter, HR 148, 2:1 conduction - Personally Reviewed  Telemetry   Atrial flutter, RVR - Personally Reviewed  Radiology    Dg Chest Port 1 View  Result Date: 08/20/2019 CLINICAL DATA:  Shortness of breath on exertion with  tachycardia, history of asthma. EXAM: PORTABLE CHEST 1 VIEW COMPARISON:  None. FINDINGS: Cameron heart size and mediastinal contours are within normal limits. Both lungs are clear. Signs of previous lower cervical spinal fusion are partially imaged in Cameron upper portion of this radiograph. IMPRESSION: No active disease. Electronically Signed   By: Zetta Bills M.D.   On: 08/20/2019 12:27    Cardiac Studies   Echo ordered TEE ordered  Assessment   Principal Problem:   New onset type 2 diabetes mellitus ([place]) Active Problems:   Atrial flutter with rapid ventricular response (HCC)   Leukocytosis   Unintentional weight loss   Polyuria   Hypotension due to hypovolemia   Diabetes mellitus due to underlying condition, uncontrolled, with hyperglycemia ([place])   Plan   1. Atrial flutter, RVR: He is for TEE/DCCV this am, 11:30 case.  - BP was too low for BB/CCB rate control, is on low-dose BB - he is to get his 3rd dose of Eliquis this am  2. Diabetes: A1c was 11.5  - new dx, he is struggling with this - asked nurse to get Diabetes coordinator to come back and speak with him, he needs help w/ dietary choices  Otherwise, per IM  Rosaria Ferries, PA-C 08/22/2019 10:26 AM [place] [id number]   Time Spent Directly with Arroyo:   Length of Stay:  LOS: 1 day   Pixie Casino, MD, [facility], [place] Director of [facility] Diplomate of [place] American Board of Clinical Lipidology Attending Cardiologist  Direct Dial: [phone]  Fax: [phone]  Website:  [email or web address]  Addysyn Fern 08/22/2019, 7:07 AM

## 2019-08-22 NOTE — CV Procedure (Signed)
   TRANSESOPHAGEAL ECHOCARDIOGRAM GUIDED DIRECT CURRENT CARDIOVERSION  NAME:  Cameron Arroyo   MRN: [id number] DOB:  11-02-1951   ADMIT DATE: 08/20/2019  INDICATIONS: Symptomatic atrial flutter  PROCEDURE:   Informed consent was obtained prior to the procedure. The risks, benefits and alternatives for the procedure were discussed and the patient comprehended these risks.  Risks include, but are not limited to, cough, sore throat, vomiting, nausea, somnolence, esophageal and stomach trauma or perforation, bleeding, low blood pressure, aspiration, pneumonia, infection, trauma to the teeth and death.    After a procedural time-out, the oropharynx was anesthetized and the patient was sedated by the anesthesia service. The transesophageal probe was inserted in the esophagus and stomach without difficulty and multiple views were obtained. Anesthesia was monitored by Dr. Royce Macadamia and Leim Fabry, CRNA  COMPLICATIONS:    Complications: No complications Patient tolerated procedure well.  FINDINGS:  LEFT VENTRICLE: EF = 50-55%  RIGHT VENTRICLE: Normal size and function.   LEFT ATRIUM: No thrombus/mass.  LEFT ATRIAL APPENDAGE: No thrombus/mass.   RIGHT ATRIUM: No thrombus/mass.  AORTIC VALVE:  Trileaflet. No regurgitation.   MITRAL VALVE:    Normal structure. Mild regurgitation.   TRICUSPID VALVE: Normal structure. No regurgitation.   PULMONIC VALVE: Grossly normal structure. No regurgitation. No apparent vegetation.  INTERATRIAL SEPTUM: No PFO or ASD seen by color Doppler.  PERICARDIUM: No effusion noted.  DESCENDING AORTA: Mild plaque    CARDIOVERSION:     Indications:  Symptomatic Atrial Flutter  Procedure Details:  Once the TEE was complete, the patient had the defibrillator pads placed in the anterior and posterior position. Once an appropriate level of sedation was confirmed, the patient was cardioverted x 1 with 100J of biphasic synchronized energy.  The patient converted  to NSR.  There were no apparent complications.  The patient had normal neuro status and respiratory status post procedure with vitals stable as recorded elsewhere.  Adequate airway was maintained throughout and vital signs monitored per protocol.  Oswaldo Milian MD [place]  [address] [place], [place] [address] [phone]   3:11 PM

## 2019-08-22 NOTE — Progress Notes (Signed)
  Echocardiogram Echocardiogram Transesophageal has been performed.  Cameron Arroyo 08/22/2019, 11:42 AM

## 2019-08-22 NOTE — Progress Notes (Signed)
PROGRESS NOTE    Cameron Arroyo  [id number] DOB: [birth year] DOA: 08/20/2019 PCP: Patient, No Pcp Per   Brief Narrative:  Cameron Palomarez Jonesis a 68 y.o.malewithno significant past medical history came to the emergency department with multiple problems. He reports that he feels like crap, polyuria, dry mouth, decreased appetite, weight loss, shortness of breath, anxiety, no energy since 2 to 3 weeks. He went to [facility] health urgent care initially where he was found to have atrial flutter with rapid response and was advised to go to the emergency department for further evaluation and management. He reports shortness of breath but denies association with chest pain, palpitation, headache, blurry vision, lightheadedness, dizziness, nausea, vomiting, epigastric pain, diarrhea, constipation, fever, chills, decreased appetite, night sweats, family history of thyroid issues, colon cancer, hematemesis, melena, over-the-counter use of NSAIDs. Reports unintentional weight loss of 30 pounds in the last 1 year. He is a former smoker however denies alcohol, illicit drug use. He was prescribed steroids by his PCP recently for left knee pain. Patient reports that his left knee pain is better. ED Course:Patient was found to have rapid narrow complex tachycardia suggestive of atrial flutter.Hyperglycemia of blood glucose 430, and leukocytosis of 12.7. Initial troponin x1-. Magnesium level elevated at 2.5. Chest x-ray came back negative. COVID-19: Pending. CBC: H&H is stable. Kidney function: WNL.[name] Kitchen Started on IV diltiazem and IV heparin.Transthoracic echo was ordered which was not performed due to patient's tachycardia.  Assessment & Plan:   Principal Problem:   New onset type 2 diabetes mellitus ([place]) Active Problems:   Atrial flutter with rapid ventricular response (HCC)   Leukocytosis   Unintentional weight loss   Polyuria   Hypotension due to hypovolemia   Diabetes mellitus due to underlying  condition, uncontrolled, with hyperglycemia (HCC)   Hyperglycemia/ newly diagnosed diabetes mellitus, POA:  -Blood sugar more than 400 upon arrival -A1C 11.5 -Patient presented with polyuria, dry mouth, weight loss, fatigue -will start Lantus 20u / day and SSI -volume repletion, hypotension may be his normal vs vol depletion from polyuria -consult diabetes team for education; dietary following  Atrial flutter, asymptomatic, POA: -Patient asymptomatic regarding arrhythmia -Patient heart rates more than 150s - trop neg and CXR negative - Thyroid function study WNL - seen by cardiology, on IV heparin and IV diltiazem now - pending TEE/cardioversion afternoon 08/22/19  Leukocytosis, reactive, resolved: -Likely reactive given recent steroid use, A. fib/flutter and hyperglycemia  Unintentional weight loss: -Reported loss of 30 pounds in 1 year. -Likely in the setting of poorly controlled diabetes with A1c of 11.5  DVT prophylaxis: Transition to Eliquis per cardiology Code Status: Partial, DO NOT INTUBATE Family Communication: None present Disposition Plan: Patient, pending further cardiac evaluation and work-up  Consultants:   Cardiology  Procedures:   Tentative cardioversion  Subjective: No acute issues or events overnight, denies chest pain, shortness of breath, nausea, vomiting, diarrhea, constipation, headache, fevers, chills.  Objective: Vitals:   08/22/19 1137 08/22/19 1145 08/22/19 1150 08/22/19 1205  BP: 140/84 136/73 122/69 91/62  Pulse: 68 78 79 85  Resp: 18 12 (!) 0 12  Temp:      TempSrc: Oral   Oral  SpO2: 96% 98% 96% 98%  Weight:      Height:        Intake/Output Summary (Last 24 hours) at 08/22/2019 1240 Last data filed at 08/22/2019 1132 Gross per 24 hour  Intake 2285.75 ml  Output 650 ml  Net 1635.75 ml   [employer]  08/20/19 1500 08/21/19 1038 08/22/19 0536  Weight: 89.8 kg 83.6 kg 85.5 kg    Examination:  General exam: Appears calm  and comfortable  Respiratory system: Clear to auscultation. Respiratory effort normal. Cardiovascular system: irregularly irregular. No JVD, murmurs, rubs, gallops or clicks. No pedal edema. Gastrointestinal system: Abdomen is nondistended, soft and nontender. No organomegaly or masses felt. Normal bowel sounds heard. Central nervous system: Alert and oriented. No focal neurological deficits. Extremities: Symmetric 5 x 5 power. Skin: No rashes, lesions or ulcers  Data Reviewed: I have personally reviewed following labs and imaging studies  CBC: Recent Labs  Lab 08/20/19 1132 08/21/19 0204 08/22/19 0310  WBC 12.7* 9.3 7.3  NEUTROABS  --   --  3.7  HGB 16.9 14.0 13.3  HCT 49.0 41.4 39.1  MCV 94.2 96.1 96.1  PLT 214 146* [id number]*   Basic Metabolic Panel: Recent Labs  Lab 08/20/19 1132 08/21/19 0204 08/22/19 0310  NA 137 136 137  K 4.9 4.0 3.5  CL 102 105 107  CO2 24 23 23   GLUCOSE 430* 340* 155*  BUN 21 19 18   CREATININE 0.97 0.90 0.76  CALCIUM 9.3 7.9* 7.8*  MG 2.5*  --   --    GFR: Estimated Creatinine Clearance: 98.3 mL/min (by C-G formula based on SCr of 0.76 mg/dL). Liver Function Tests: Recent Labs  Lab 08/21/19 0204  AST 15  ALT 21  ALKPHOS 66  BILITOT 1.3*  PROT 5.2*  ALBUMIN 3.0*   No results for input(s): LIPASE, AMYLASE in the last 168 hours. No results for input(s): AMMONIA in the last 168 hours. Coagulation Profile: Recent Labs  Lab 08/22/19 0310  INR 1.1   Cardiac Enzymes: No results for input(s): CKTOTAL, CKMB, CKMBINDEX, TROPONINI in the last 168 hours. BNP (last 3 results) No results for input(s): PROBNP in the last 8760 hours. HbA1C: Recent Labs    08/20/19 1132  HGBA1C 11.5*   CBG: Recent Labs  Lab 08/21/19 1051 08/21/19 1530 08/21/19 2045 08/22/19 0759 08/22/19 1108  GLUCAP 248* 236* 246* 206* 124*   Lipid Profile: No results for input(s): CHOL, HDL, LDLCALC, TRIG, CHOLHDL, LDLDIRECT in the last 72 hours. Thyroid Function  Tests: Recent Labs    08/20/19 1612  TSH 1.194  FREET4 1.05   Anemia Panel: No results for input(s): VITAMINB12, FOLATE, FERRITIN, TIBC, IRON, RETICCTPCT in the last 72 hours. Sepsis Labs: No results for input(s): PROCALCITON, LATICACIDVEN in the last 168 hours.  Recent Results (from the past 240 hour(s))  SARS CORONAVIRUS 2 (TAT 6-24 HRS) Nasopharyngeal Nasopharyngeal Swab     Status: None   Collection Time: 08/20/19 11:51 AM   Specimen: Nasopharyngeal Swab  Result Value Ref Range Status   SARS Coronavirus 2 NEGATIVE NEGATIVE Final    Comment: (NOTE) SARS-CoV-2 target nucleic acids are NOT DETECTED. The SARS-CoV-2 RNA is generally detectable in upper and lower respiratory specimens during the acute phase of infection. Negative results do not preclude SARS-CoV-2 infection, do not rule out co-infections with other pathogens, and should not be used as the sole basis for treatment or other patient management decisions. Negative results must be combined with clinical observations, patient history, and epidemiological information. The expected result is Negative. Fact Sheet for Patients: SugarRoll.be Fact Sheet for Healthcare Providers: https://www.woods-mathews.com/ This test is not yet approved or cleared by the [place] FDA and  has been authorized for detection and/or diagnosis of SARS-CoV-2 by FDA under an Emergency Use Authorization (EUA). This EUA will remain  in  effect (meaning this test can be used) for the duration of the COVID-19 declaration under Section 56 4(b)(1) of the Act, 21 U.S.C. section 360bbb-3(b)(1), unless the authorization is terminated or revoked sooner. Performed at [facility], [place] [address], [place], [place] [address]     Radiology Studies: No results found.  Scheduled Meds: . apixaban  5 mg Oral BID  . insulin aspart  0-15 Units Subcutaneous TID WC  . insulin aspart  0-5 Units Subcutaneous  QHS  . insulin glargine  20 Units Subcutaneous Daily  . metoprolol tartrate  12.5 mg Oral BID  . sodium chloride flush  3 mL Intravenous Once  . sodium chloride flush  3 mL Intravenous Q12H   Continuous Infusions: . sodium chloride 75 mL/hr at 08/21/19 1143  . sodium chloride    . diltiazem (CARDIZEM) infusion Stopped (08/21/19 0751)     LOS: 1 day    Time spent:35 min  Little Ishikawa, DO [facility]  If 7PM-7AM, please contact night-coverage [email or web address] Password [facility] 08/22/2019, 12:40 PM

## 2019-08-23 ENCOUNTER — Encounter (HOSPITAL_COMMUNITY): Payer: Self-pay | Admitting: Physician Assistant

## 2019-08-23 LAB — BASIC METABOLIC PANEL
Anion gap: 7 (ref 5–15)
BUN: 17 mg/dL (ref 8–23)
CO2: 24 mmol/L (ref 22–32)
Calcium: 8.4 mg/dL — ABNORMAL LOW (ref 8.9–10.3)
Chloride: 106 mmol/L (ref 98–111)
Creatinine, Ser: 0.86 mg/dL (ref 0.61–1.24)
GFR calc Af Amer: >60 mL/min (ref >60)
GFR calc non Af Amer: >60 mL/min (ref >60)
Glucose, Bld: 143 mg/dL — ABNORMAL HIGH (ref 70–99)
Potassium: 3.6 mmol/L (ref 3.5–5.1)
Sodium: 137 mmol/L (ref 135–145)

## 2019-08-23 LAB — CBC
HCT: 37.7 % — ABNORMAL LOW (ref 39.0–52.0)
Hemoglobin: 13.4 g/dL (ref 13.0–17.0)
MCH: 33 pg (ref 26.0–34.0)
MCHC: 35.5 g/dL (ref 30.0–36.0)
MCV: 92.9 fL (ref 80.0–100.0)
Platelets: 115 10*3/uL — ABNORMAL LOW (ref 150–400)
RBC: 4.06 MIL/uL — ABNORMAL LOW (ref 4.22–5.81)
RDW: 12.4 % (ref 11.5–15.5)
WBC: 8.3 10*3/uL (ref 4.0–10.5)
nRBC: 0 % (ref 0.0–0.2)

## 2019-08-23 LAB — GLUCOSE, CAPILLARY
Glucose-Capillary: 145 mg/dL — ABNORMAL HIGH (ref 70–99)
Glucose-Capillary: 178 mg/dL — ABNORMAL HIGH (ref 70–99)

## 2019-08-23 MED ORDER — INSULIN GLARGINE 100 UNIT/ML ~~LOC~~ SOLN: 20.0000 [IU] | Freq: Every day | SUBCUTANEOUS | 11 refills | Status: DC

## 2019-08-23 MED ORDER — METFORMIN HCL 500 MG PO TABS: 500.0000 mg | ORAL_TABLET | Freq: Every day | ORAL | 2 refills | Status: DC

## 2019-08-23 MED ORDER — APIXABAN 5 MG PO TABS: 5.0000 mg | ORAL_TABLET | Freq: Two times a day (BID) | ORAL | 3 refills | Status: DC

## 2019-08-23 MED ORDER — INSULIN STARTER KIT- PEN NEEDLES (ENGLISH): 1.0000 | Freq: Once | 0 refills | Status: AC

## 2019-08-23 MED ORDER — METOPROLOL TARTRATE 25 MG PO TABS: 12.5000 mg | ORAL_TABLET | Freq: Two times a day (BID) | ORAL | 2 refills | Status: DC

## 2019-08-23 MED ORDER — OFF THE BEAT BOOK
Freq: Once | Status: AC
  Administered 2019-08-23: 1
  Filled 2019-08-23: qty 1

## 2019-08-23 MED ORDER — INSULIN STARTER KIT- PEN NEEDLES (ENGLISH)
1.0000 | Freq: Once | Status: AC
  Administered 2019-08-23: 1
  Filled 2019-08-23 (×2): qty 1

## 2019-08-23 MED ORDER — NOVOLIN N FLEXPEN 100 UNIT/ML ~~LOC~~ SUPN: PEN_INJECTOR | SUBCUTANEOUS | 11 refills | Status: DC

## 2019-08-23 MED ORDER — METFORMIN HCL 500 MG PO TABS
500.0000 mg | ORAL_TABLET | Freq: Every day | ORAL | Status: DC
  Administered 2019-08-23: 500 mg via ORAL

## 2019-08-23 MED ORDER — LEVEMIR FLEXTOUCH 100 UNIT/ML ~~LOC~~ SOPN: 20.0000 [IU] | PEN_INJECTOR | Freq: Every day | SUBCUTANEOUS | 11 refills | Status: DC

## 2019-08-23 MED FILL — METOPROLOL TARTRATE 25 MG T: 25 | 30 days supply | Qty: 30 | Fill #0

## 2019-08-23 MED FILL — ELIQUIS 5 MG TABLET: 5 | 30 days supply | Qty: 60 | Fill #0

## 2019-08-23 MED FILL — metFORMIN HCL 500 MG TABS: 500 | 30 days supply | Qty: 30 | Fill #0

## 2019-08-23 NOTE — Discharge Summary (Signed)
Physician Discharge Summary [facility]    Patient: Cameron Arroyo                   Admit date: 08/20/2019   DOB: Jun 02, 1951             Discharge date:08/23/2019/11:20 AM PFX:[id number]                          PCP: Patient, No Pcp Per  Disposition: Home   Recommendations for Outpatient Follow-up:    Follow up: in 1 week  Discharge Condition: Stable   Code Status:   Code Status: Partial Code  Diet recommendation: Diabetic diet   Discharge Diagnoses:    Principal Problem:   New onset type 2 diabetes mellitus ([place]) Active Problems:   Atrial flutter with rapid ventricular response ([place])   Leukocytosis   Unintentional weight loss   Polyuria   Hypotension due to hypovolemia   Diabetes mellitus due to underlying condition, uncontrolled, with hyperglycemia ([place])   History of Present Illness/ Hospital Course Cameron Arroyo Summary:  Cameron Jenkins Jonesis a 68 y.o.malewithno significant past medical history came to the emergency department with multiple problems. He reports that he feels like crap, polyuria, dry mouth, decreased appetite, weight loss, shortness of breath, anxiety, no energy since 2 to 3 weeks. He went to [facility] health urgent care initially where he was found to have atrial flutter with rapid response and was advised to go to the emergency department for further evaluation and management. He reports shortness of breath but denies association with chest pain, palpitation, headache, blurry vision, lightheadedness, dizziness, nausea, vomiting, epigastric pain, diarrhea, constipation, fever, chills, decreased appetite, night sweats, family history of thyroid issues, colon cancer, hematemesis, melena, over-the-counter use of NSAIDs. Reports unintentional weight loss of 30 pounds in the last 1 year. He is a former smoker however denies alcohol, illicit drug use. He was prescribed steroids by his PCP recently for left knee pain. Patient reports that his left knee pain is  better.   ED Course:Patient was found to have rapid narrow complex tachycardia suggestive of atrial flutter.Hyperglycemia of blood glucose 430, and leukocytosis of 12.7. Initial troponin x1-. Magnesium level elevated at 2.5. Chest x-ray came back negative. COVID-19: Pending. CBC: H&H is stable. Kidney function: WNL.[name] Kitchen Started on IV diltiazem and IV heparin.Transthoracic echo was ordered which was not performed due to patient's tachycardia.  Briefly patient was admitted with atrial flutter, status post TEE cardioversion which was successful on 08/22/2019 currently in normal sinus rhythm.  Per cardiology patient started on metoprolol and Eliquis.  Patient was noted from new onset diabetes mellitus with a hemoglobin A1c of 11.5.  Patient started on Lantus and metformin.  He was educated on diabetes, diet, instructed to check his blood sugars 3 times daily-4 times daily, keep a log for adjustment and titration of the medication for better glycemic control.  Patient was deemed stable was discharged home.   Discharge summary/Assessment & Plan:    Hyperglycemia/ newly diagnosed diabetes mellitus, POA:  -Blood sugar more than 400 upon arrival -A1C 11.5 -Patient presented with polyuria, dry mouth, weight loss, fatigue -will start Lantus 20u / day and SSI/started on metformin 500 mg -Patient was given glucometer, instructed to check blood sugars 3 times daily-4 times daily, keep close log... Follow with PCP with adjustment of medication accordingly.  -Status post volume repletion -consult diabetes team for education; dietary following  Atrial flutter, asymptomatic, POA: -Patientasymptomatic regarding  Patient heart rates more than 150s °- trop neg and CXR negative °- Thyroid function study WNL °- seen by cardiology, on IV heparin and IV diltiazem now - pending TEE/cardioversion On  08/22/19 °-Cardioversion was successful, patient is currently in normal sinus rhythm °-Per  cardiology patient was started on metoprolol and Eliquis prescription were provided °  °Leukocytosis, reactive, resolved: °-Likely reactive given recent steroid use, A. fib/flutter and hyperglycemia °-Resolved no signs of infection. ° °Unintentional weight loss: °-Reported loss of 30 pounds in 1 year. °-Likely in the setting of poorly controlled diabetes with A1c of 11.5 °-Patient was instructed to keep an close log of his weight changes. °  ° °Code Status: Partial, DO NOT INTUBATE °Family Communication: None present °Disposition Plan:  Patient will be discharged home, with close follow with PCP and cardiologist in 1 to 2 weeks. °  °Consultants:  °· Cardiology °  °Procedures:  °· Status post tentative cardioversion on 08/22/2019 currently on normal sinus rhythm ° ° °Discharge Instructions:  ° °Discharge Instructions   ° Activity as tolerated - No restrictions   Complete by: As directed °  ° Diet Carb Modified   Complete by: As directed °  ° Discharge instructions   Complete by: As directed °  ° Please try to keep p.o. blood sugars 3-4 times a day before meals. °Especially in the mornings. °Please keep a close log and follow-up with PCP for adjustment of the medication as needed  ° Increase activity slowly   Complete by: As directed °  °  ° °  °Medication List  °  °TAKE these medications   °apixaban 5 MG Tabs tablet °Commonly known as: ELIQUIS °Take 1 tablet (5 mg total) by mouth 2 (two) times daily. °  °insulin glargine 100 UNIT/ML injection °Commonly known as: LANTUS °Inject 0.2 mLs (20 Units total) into the skin daily. °Start taking on: August 24, 2019 °  °insulin starter kit- pen needles Misc °1 kit by Other route once for 1 dose. °  °metFORMIN 500 MG tablet °Commonly known as: GLUCOPHAGE °Take 1 tablet (500 mg total) by mouth daily with breakfast. °Start taking on: August 24, 2019 °  °metoprolol tartrate 25 MG tablet °Commonly known as: LOPRESSOR °Take 0.5 tablets (12.5 mg total) by mouth 2 (two) times  daily. °  °  ° °Follow-up Information   ° [facility] Follow up on 09/11/2019.   °Why: @ 9:30 am for hospital follow up visit with MD Patrick Wright. If you cannot make this scheduled appointment- please call the office to reschedule. Pharmacy onsite with medications ranging from $4.00-$10.00 °Contact information: °[address] °[place] [address] °[phone] ° °  °  °  ° °No Known Allergies ° ° °Procedures /Studies:  ° °Dg Chest Port 1 View ° °Result Date: 08/20/2019 °CLINICAL DATA:  Shortness of breath on exertion with tachycardia, history of asthma. EXAM: PORTABLE CHEST 1 VIEW COMPARISON:  None. FINDINGS: The heart size and mediastinal contours are within normal limits. Both lungs are clear. Signs of previous lower cervical spinal fusion are partially imaged in the upper portion of this radiograph. IMPRESSION: No active disease. Electronically Signed   By: Geoffrey  Wile M.D.   On: 08/20/2019 12:27  °·  ° °Subjective:  ° °Patient was seen and examined 08/23/2019, 11:20 AM °Patient stable today. No acute distress.  No issues overnight °Stable for discharge. ° °Discharge Exam:  ° ° °Vitals:  ° 08/22/19 1205 08/22/19 2125 08/23/19 0512 08/23/19 0515  °BP:   BP: 91/62 97/63 103/61   Pulse: 85 81 (!) 49   Resp: 12  16   Temp:  98.2 F (36.8 C) 98.1 F (36.7 C)   TempSrc:  Oral Oral   SpO2: 98% 97% 97%   Weight:    87 kg  Height:        General: Pt lying comfortably in bed & appears in no obvious distress. Cardiovascular: S1 & S2 heard, RRR, S1/S2 +. No murmurs, rubs, gallops or clicks. No JVD or pedal edema. Respiratory: Clear to auscultation without wheezing, rhonchi or crackles. No increased work of breathing. Abdominal:  Non-distended, non-tender & soft. No organomegaly or masses appreciated. Normal bowel sounds heard. CNS: Alert and oriented. No focal deficits. Extremities: no edema, no cyanosis    The results of significant diagnostics from this  hospitalization (including imaging, microbiology, ancillary and laboratory) are listed below for reference.      Microbiology:   Recent Results (from the past 240 hour(s))  SARS CORONAVIRUS 2 (TAT 6-24 HRS) Nasopharyngeal Nasopharyngeal Swab     Status: None   Collection Time: 08/20/19 11:51 AM   Specimen: Nasopharyngeal Swab  Result Value Ref Range Status   SARS Coronavirus 2 NEGATIVE NEGATIVE Final    Comment: (NOTE) SARS-CoV-2 target nucleic acids are NOT DETECTED. The SARS-CoV-2 RNA is generally detectable in upper and lower respiratory specimens during the acute phase of infection. Negative results do not preclude SARS-CoV-2 infection, do not rule out co-infections with other pathogens, and should not be used as the sole basis for treatment or other patient management decisions. Negative results must be combined with clinical observations, patient history, and epidemiological information. The expected result is Negative. Fact Sheet for Patients: SugarRoll.be Fact Sheet for Healthcare Providers: https://www.woods-mathews.com/ This test is not yet approved or cleared by the [place] FDA and  has been authorized for detection and/or diagnosis of SARS-CoV-2 by FDA under an Emergency Use Authorization (EUA). This EUA will remain  in effect (meaning this test can be used) for the duration of the COVID-19 declaration under Section 56 4(b)(1) of the Act, 21 U.S.C. section 360bbb-3(b)(1), unless the authorization is terminated or revoked sooner. Performed at [facility], [place] [address], [place], [place] [address]      Labs:   CBC: Recent Labs  Lab 08/20/19 1132 08/21/19 0204 08/22/19 0310 08/23/19 0228  WBC 12.7* 9.3 7.3 8.3  NEUTROABS  --   --  3.7  --   HGB 16.9 14.0 13.3 13.4  HCT 49.0 41.4 39.1 37.7*  MCV 94.2 96.1 96.1 92.9  PLT 214 146* 131* 878*   Basic Metabolic Panel: Recent Labs  Lab 08/20/19 1132  08/21/19 0204 08/22/19 0310 08/23/19 0228  NA 137 136 137 137  K 4.9 4.0 3.5 3.6  CL 102 105 107 106  CO2 _0 GLUCOSE 430* 340* 155* 143*  BUN _1 CREATININE 0.97 0.90 0.76 0.86  CALCIUM 9.3 7.9* 7.8* 8.4*  MG 2.5*  --   --   --    Liver Function Tests: Recent Labs  Lab 08/21/19 0204  AST 15  ALT 21  ALKPHOS 66  BILITOT 1.3*  PROT 5.2*  ALBUMIN 3.0*   BNP (last 3 results) No results for input(s): BNP in the last 8760 hours. Cardiac Enzymes: No results for input(s): CKTOTAL, CKMB, CKMBINDEX, TROPONINI in the last 168 hours. CBG: Recent Labs  Lab 08/22/19 1108 08/22/19 1619 08/22/19 2126 08/23/19 0746 08/23/19 1112  GLUCAP 124* 197* 254* 145* 178*   Hgb A1c Recent Labs    08/20/19 1132  HGBA1C 11.5*   Lipid Profile No results for input(s): CHOL, HDL, LDLCALC, TRIG, CHOLHDL, LDLDIRECT in the last 72 hours. Thyroid function studies Recent Labs    08/20/19 1612  TSH 1.194   Anemia work up No results for input(s): VITAMINB12, FOLATE, FERRITIN, TIBC, IRON, RETICCTPCT in the last 72 hours. Urinalysis    Component Value Date/Time   COLORURINE YELLOW 08/20/2019 2100   APPEARANCEUR CLEAR 08/20/2019 2100   LABSPEC 1.036 (H) 08/20/2019 2100   PHURINE 5.0 08/20/2019 2100   GLUCOSEU >=500 (A) 08/20/2019 2100   HGBUR NEGATIVE 08/20/2019 2100   BILIRUBINUR NEGATIVE 08/20/2019 2100   KETONESUR 80 (A) 08/20/2019 2100   PROTEINUR NEGATIVE 08/20/2019 2100   NITRITE NEGATIVE 08/20/2019 2100   LEUKOCYTESUR NEGATIVE 08/20/2019 2100    Time coordinating discharge: Over 30 minutes  SIGNED: Deatra James, MD, FACP, [facility]. [facility],  Pager [phone]  If 7PM-7AM, please contact night-coverage [email or web address], Password [facility] 08/23/2019, 11:20 AM

## 2019-08-23 NOTE — Progress Notes (Signed)
Pt with multiple questions regarding diet, carbs and sugars, I spoke with the Diabetic Coordinator [place]. And she called into the room and spoke with wife.  I completed the d/c instructions in great detail and both verbalized understanding, both also stated that they would not pay for any meds so if they were going to cost them anything he would just go without.  Nicole Kindred delivered free 30 day meds and Hassan Rowan CM spoke with patient and wife as well.  D/c to home with wife, prescriptions, d/c instructions, and insulin starter kit.

## 2019-08-23 NOTE — TOC Initial Note (Signed)
Transition of Care [facility]) - Initial/Assessment Note    Cameron Arroyo Details  Name: Cameron Arroyo MRN: [id number] Date of Birth: December 22, 1950  [facility]) CM/SW Contact:    Bethena Roys, RN Phone Number: 08/23/2019, 11:51 AM  Clinical Narrative: Pt presented for new onset diabetes. PTA from home with support of spouse. Cameron Arroyo is without PCP and CM did set [facility]- appointment placed on AVS. Cameron Arroyo has the number to [facility] just in case he wants another PCP- or he can call the 1-800 # on the [place] for in [employer]. CM did educate Cameron Arroyo regarding diet and the glucose meter, lancets and the test strips. Cameron Arroyo will receive a call form the outpatient Diabetes Clinic for educational classes in the future. Wife at the bedside during education. No further needs from CM at this time.                  Expected Discharge Plan: Home/Self Care Barriers to Discharge: No Barriers Identified   Cameron Arroyo Goals and CMS Choice Cameron Arroyo states their goals for this hospitalization and ongoing recovery are:: "to do the best he can"   Choice offered to / list presented to : NA  Expected Discharge Plan and Services Expected Discharge Plan: Home/Self Care In-house Referral: (Diabetes Educator.) Discharge Planning Services: CM Consult Post Acute Care Choice: NA Living arrangements for the past 2 months: Single Family Home Expected Discharge Date: 08/23/19                 Prior Living Arrangements/Services Living arrangements for the past 2 months: Single Family Home Lives with:: Self Cameron Arroyo language and need for interpreter reviewed:: Yes Do you feel safe going back to the place where you live?: Yes      Need for Family Participation in Cameron Arroyo Care: Yes (Comment) Care giver support system in place?: Yes (comment)   Criminal Activity/Legal Involvement Pertinent to Current Situation/Hospitalization: No - Comment as needed  Activities of Daily  Living      Permission Sought/Granted Permission sought to share information with : Family Supports, Chartered certified accountant granted to share information with : Yes, Verbal Permission Granted     Permission granted to share info w AGENCY: [facility]        Emotional Assessment Appearance:: Appears stated age Attitude/Demeanor/Rapport: Apprehensive, Engaged Affect (typically observed): Accepting, Afraid/Fearful(new diagnosis of diabetes.) Orientation: : Oriented to Self, Oriented to Place, Oriented to  Time, Oriented to Situation Alcohol / Substance Use: Not Applicable Psych Involvement: No (comment)  Admission diagnosis:  DOE (dyspnea on exertion) [R06.09] Hyperglycemia [R73.9] Unintentional weight loss [R63.4] Atrial flutter with rapid ventricular response [facility]) [I48.92] Cameron Arroyo Active Problem List   Diagnosis Date Noted  . New onset type 2 diabetes mellitus (Cameron Arroyo)   . Hypotension due to hypovolemia   . Diabetes mellitus due to underlying condition, uncontrolled, with hyperglycemia (Cameron Arroyo)   . Atrial flutter with rapid ventricular response (Cameron Arroyo) 08/20/2019  . Leukocytosis 08/20/2019  . Hypermagnesemia 08/20/2019  . Unintentional weight loss 08/20/2019  . Polyuria 08/20/2019   PCP:  Cameron Arroyo, No Pcp Per Pharmacy:   [id number] - [place], [place] - [address] [place] [address] Phone: [phone] Fax: [phone]  [facility] [place], [place] - [address] [place] [address] Phone: [phone] Fax: [phone]     Social Determinants  of Health (SDOH) Interventions    Readmission Risk Interventions No flowsheet data found.  

## 2019-08-23 NOTE — Care Management (Signed)
Per Yvone Neu W/Eliquis 5 mg.bid 90 day supply $50.00 9Generic wasn't an option. Mail order 90  day supply bid $50,00.[facility] No PA required Deductible$200.00 not met. [facility]

## 2019-08-23 NOTE — TOC Transition Note (Signed)
Transition of Care [facility]) - CM/SW Discharge Note   Patient Details  Name: Cameron Arroyo MRN: [id number] Date of Birth: 08-08-1951  Transition of Care [facility]) CM/SW Contact:  Bethena Roys, RN Phone Number: 08/23/2019, 2:00 PM   Clinical Narrative:   [facility] Pharmacist, CM and Diabetes Coordinator worked to get patient a cheaper insulin. Patient was also given the patient assistance application for Eliquis. Patient is aware to take to either PCP or cardiologist for assistance in filling it out to see if he qualifies for year supply free. Awaiting for medication delivery to the room from [place]. [employer] CM stated to patient he can get glucose supplies free [facility]- Rx written and [facility]. No further needs from CM at this time.     Final next level of care: Home/Self Care Barriers to Discharge: No Barriers Identified   Patient Goals and CMS Choice Patient states their goals for this hospitalization and ongoing recovery are:: "to do the best he can"   Choice offered to / list presented to : NA  Discharge Placement                       Discharge Plan and Services In-house Referral: (Diabetes Educator.) Discharge Planning Services: CM Consult Post Acute Care Choice: NA                               Social Determinants of Health (SDOH) Interventions     Readmission Risk Interventions No flowsheet data found.

## 2019-08-23 NOTE — TOC Transition Note (Signed)
Transition of Care [facility]) - CM/SW Discharge Note   Patient Details  Name: Cameron Arroyo MRN: [id number] Date of Birth: 21-Nov-1951  Transition of Care [facility]) CM/SW Contact:  Bethena Roys, RN Phone Number: 08/23/2019, 1:07 PM   Clinical Narrative:  CM did receive call from the Insurance provider [employer]- Case Manager. We discussed cost of medications and diabetes supplies for the patient. [facility] has a program where the meter and supplies are free. CM stated she would check on cost of medications to see if they needed prior authorization vs any cheaper route for medication regimen. [employer] CM to call patient in the room. This CM did check and patient was on the phone with the [facility] Case Manager. This CM also expressed that patient wanted a PCP in network and she will assist with that as well. [employer] CM is aware of the appointment at the [facility]. No further needs from this CM.     Final next level of care: Home/Self Care Barriers to Discharge: No Barriers Identified   Patient Goals and CMS Choice Patient states their goals for this hospitalization and ongoing recovery are:: "to do the best he can"   Choice offered to / list presented to : NA  Discharge Placement                       Discharge Plan and Services In-house Referral: (Diabetes Educator.) Discharge Planning Services: CM Consult Post Acute Care Choice: NA                               Social Determinants of Health (SDOH) Interventions     Readmission Risk Interventions No flowsheet data found.

## 2019-08-23 NOTE — Progress Notes (Signed)
DAILY PROGRESS NOTE   Patient Name: Cameron Arroyo Date of Encounter: 08/23/2019 Cardiologist: Pixie Casino, MD  Chief Complaint   Atrial flutter  Patient Profile   Cameron Arroyo is a 68 y.o. male with a hx of cervical disc disease with surgery and asthma  who is being seen for atrial flutter at the request of Dr. Sedonia Small, pt presented 09/21 for freq urination  Subjective   Feels much better today. Is concerned because he does not have PCP  Wants to come to [facility] for cards f/u.    Objective   Vitals:   08/22/19 1205 08/22/19 2125 08/23/19 0512 08/23/19 0515  BP: 91/62 97/63 103/61   Pulse: 85 81 (!) 49   Resp: 12  16   Temp:  98.2 F (36.8 C) 98.1 F (36.7 C)   TempSrc:  Oral Oral   SpO2: 98% 97% 97%   Weight:    87 kg  Height:        Intake/Output Summary (Last 24 hours) at 08/23/2019 0744 Last data filed at 08/22/2019 2215 Gross per 24 hour  Intake 740 ml  Output 325 ml  Net 415 ml   Filed Weights   08/21/19 1038 08/22/19 0536 08/23/19 0515  Weight: 83.6 kg 85.5 kg 87 kg    Physical Exam   General appearance: alert, cooperative and no distress Neck: no adenopathy, no JVD and thyroid not enlarged, symmetric, no tenderness/mass/nodules Lungs: clear to auscultation bilaterally Heart: regular rate and rhythm Abdomen: soft, non-tender; bowel sounds normal; no masses,  no organomegaly Extremities: extremities normal, atraumatic, no cyanosis or edema Pulses: 2+ and symmetric 4/4 extremity pulses 2+ Skin: Skin color, texture, turgor normal. No rashes or lesions Neurologic: Alert and oriented X 3, normal strength and tone. Normal symmetric reflexes. Normal coordination and gait  Inpatient Medications    Scheduled Meds: . apixaban  5 mg Oral BID  . insulin aspart  0-15 Units Subcutaneous TID WC  . insulin aspart  0-5 Units Subcutaneous QHS  . insulin glargine  20 Units Subcutaneous Daily  . metoprolol tartrate  12.5 mg Oral BID  . off the beat book   Does  not apply Once  . sodium chloride flush  3 mL Intravenous Once  . sodium chloride flush  3 mL Intravenous Q12H    Continuous Infusions: . sodium chloride 75 mL/hr at 08/21/19 1143  . sodium chloride    . diltiazem (CARDIZEM) infusion Stopped (08/21/19 0751)    PRN Meds: acetaminophen **OR** acetaminophen, ondansetron **OR** ondansetron (ZOFRAN) IV, sodium chloride flush   Labs   Results for orders placed or performed during the hospital encounter of 08/20/19 (from the past 48 hour(s))  CBG monitoring, ED     Status: Abnormal   Collection Time: 08/21/19  8:15 AM  Result Value Ref Range   Glucose-Capillary 282 (H) 70 - 99 mg/dL  C-peptide     Status: None   Collection Time: 08/21/19  8:56 AM  Result Value Ref Range   C-Peptide 1.8 1.1 - 4.4 ng/mL    Comment: (NOTE) C-Peptide reference interval is for fasting patients. Performed At: [facility] [place], [place] [id number] Rush Farmer MD [id number]   Heparin level (unfractionated)     Status: None   Collection Time: 08/21/19  8:56 AM  Result Value Ref Range   Heparin Unfractionated 0.58 0.30 - 0.70 IU/mL    Comment: (NOTE) If heparin results are below expected values, and patient dosage has  been confirmed, suggest follow up testing of antithrombin III levels. Performed at [facility], [place] [address], [place], [place] [address]   HIV antibody (Routine Testing)     Status: None   Collection Time: 08/21/19  8:56 AM  Result Value Ref Range   HIV Screen 4th Generation wRfx Non Reactive Non Reactive    Comment: (NOTE) Performed At: [facility] [place], [place] [id number] Rush Farmer MD [id number]   Glucose, capillary     Status: Abnormal   Collection Time: 08/21/19 10:51 AM  Result Value Ref Range   Glucose-Capillary 248 (H) 70 - 99 mg/dL  Glucose, capillary     Status: Abnormal   Collection Time: 08/21/19  3:30 PM  Result Value Ref Range    Glucose-Capillary 236 (H) 70 - 99 mg/dL  Glucose, capillary     Status: Abnormal   Collection Time: 08/21/19  8:45 PM  Result Value Ref Range   Glucose-Capillary 246 (H) 70 - 99 mg/dL  Basic metabolic panel     Status: Abnormal   Collection Time: 08/22/19  3:10 AM  Result Value Ref Range   Sodium 137 135 - 145 mmol/L   Potassium 3.5 3.5 - 5.1 mmol/L   Chloride 107 98 - 111 mmol/L   CO2 23 22 - 32 mmol/L   Glucose, Bld 155 (H) 70 - 99 mg/dL   BUN 18 8 - 23 mg/dL   Creatinine, Ser 0.76 0.61 - 1.24 mg/dL   Calcium 7.8 (L) 8.9 - 10.3 mg/dL   GFR calc non Af Amer >60 >60 mL/min   GFR calc Af Amer >60 >60 mL/min   Anion gap 7 5 - 15    Comment: Performed at [facility], [place]. [address] [place], [place] [address]  CBC WITH DIFFERENTIAL     Status: Abnormal   Collection Time: 08/22/19  3:10 AM  Result Value Ref Range   WBC 7.3 4.0 - 10.5 K/uL   RBC 4.07 (L) 4.22 - 5.81 MIL/uL   Hemoglobin 13.3 13.0 - 17.0 g/dL   HCT 39.1 39.0 - 52.0 %   MCV 96.1 80.0 - 100.0 fL   MCH 32.7 26.0 - 34.0 pg   MCHC 34.0 30.0 - 36.0 g/dL   RDW 12.3 11.5 - 15.5 %   Platelets 131 (L) 150 - 400 K/uL    Comment: REPEATED TO VERIFY   nRBC 0.0 0.0 - 0.2 %   Neutrophils Relative % 50 %   Neutro Abs 3.7 1.7 - 7.7 K/uL   Lymphocytes Relative 42 %   Lymphs Abs 3.0 0.7 - 4.0 K/uL   Monocytes Relative 6 %   Monocytes Absolute 0.4 0.1 - 1.0 K/uL   Eosinophils Relative 2 %   Eosinophils Absolute 0.1 0.0 - 0.5 K/uL   Basophils Relative 0 %   Basophils Absolute 0.0 0.0 - 0.1 K/uL   Immature Granulocytes 0 %   Abs Immature Granulocytes 0.03 0.00 - 0.07 K/uL    Comment: Performed at [facility] [address], [place], [place] [address]  Protime-INR     Status: None   Collection Time: 08/22/19  3:10 AM  Result Value Ref Range   Prothrombin Time 14.3 11.4 - 15.2 seconds   INR 1.1 0.8 - 1.2    Comment: (NOTE) INR goal varies based on device and disease states. Performed at [facility],  [place] [address], [place], [place] [address]   Glucose, capillary  Status: Abnormal   Collection Time: August 24, 2019  7:59 AM  Result Value Ref Range   Glucose-Capillary 206 (H) 70 - 99 mg/dL  Glucose, capillary     Status: Abnormal   Collection Time: 2019-08-24 11:08 AM  Result Value Ref Range   Glucose-Capillary 124 (H) 70 - 99 mg/dL  Glucose, capillary     Status: Abnormal   Collection Time: 08-24-2019  4:19 PM  Result Value Ref Range   Glucose-Capillary 197 (H) 70 - 99 mg/dL  Glucose, capillary     Status: Abnormal   Collection Time: 2019/08/24  9:26 PM  Result Value Ref Range   Glucose-Capillary 254 (H) 70 - 99 mg/dL  CBC     Status: Abnormal   Collection Time: 08/23/19  2:28 AM  Result Value Ref Range   WBC 8.3 4.0 - 10.5 K/uL   RBC 4.06 (L) 4.22 - 5.81 MIL/uL   Hemoglobin 13.4 13.0 - 17.0 g/dL   HCT 37.7 (L) 39.0 - 52.0 %   MCV 92.9 80.0 - 100.0 fL   MCH 33.0 26.0 - 34.0 pg   MCHC 35.5 30.0 - 36.0 g/dL   RDW 12.4 11.5 - 15.5 %   Platelets 115 (L) 150 - 400 K/uL    Comment: REPEATED TO VERIFY PLATELET COUNT CONFIRMED BY SMEAR Immature Platelet Fraction may be clinically indicated, consider ordering this additional test [id number]    nRBC 0.0 0.0 - 0.2 %    Comment: Performed at [facility], [place]. [address] [place], [place] [id number]  Basic metabolic panel     Status: Abnormal   Collection Time: 08/23/19  2:28 AM  Result Value Ref Range   Sodium 137 135 - 145 mmol/L   Potassium 3.6 3.5 - 5.1 mmol/L   Chloride 106 98 - 111 mmol/L   CO2 24 22 - 32 mmol/L   Glucose, Bld 143 (H) 70 - 99 mg/dL   BUN 17 8 - 23 mg/dL   Creatinine, Ser 0.86 0.61 - 1.24 mg/dL   Calcium 8.4 (L) 8.9 - 10.3 mg/dL   GFR calc non Af Amer >60 >60 mL/min   GFR calc Af Amer >60 >60 mL/min   Anion gap 7 5 - 15    Comment: Performed at [facility] [address], [place], [place] [address]    ECG   08-24-2023 ECG is typical atrial flutter, HR 148, 2:1 conduction - Personally Reviewed   Telemetry   SR w/ brief episodes flutter vs PAT - Personally Reviewed  Radiology    No results found.  Cardiac Studies   Echo ordered TEE/DCCV: 2019-08-24 LEFT VENTRICLE: EF = 50-55%  RIGHT VENTRICLE: Normal size and function.   LEFT ATRIUM: No thrombus/mass.  LEFT ATRIAL APPENDAGE: No thrombus/mass.   RIGHT ATRIUM: No thrombus/mass.  AORTIC VALVE:  Trileaflet. No regurgitation.   MITRAL VALVE:    Normal structure. Mild regurgitation.   TRICUSPID VALVE: Normal structure. No regurgitation.   PULMONIC VALVE: Grossly normal structure. No regurgitation. No apparent vegetation.  INTERATRIAL SEPTUM: No PFO or ASD seen by color Doppler.  PERICARDIUM: No effusion noted.  DESCENDING AORTA: Mild plaque   Once the TEE was complete, the patient had the defibrillator pads placed in the anterior and posterior position. Once an appropriate level of sedation was confirmed, the patient was cardioverted x 1 with 100J of biphasic synchronized energy.  The patient converted to NSR.  There were no apparent complications.  The patient had normal neuro status and respiratory status  post procedure with vitals stable as recorded elsewhere.  Adequate airway was maintained throughout and vital signs monitored per protocol.  Assessment   Principal Problem:   New onset type 2 diabetes mellitus ([place]) Active Problems:   Atrial flutter with rapid ventricular response (HCC)   Leukocytosis   Unintentional weight loss   Polyuria   Hypotension due to hypovolemia   Diabetes mellitus due to underlying condition, uncontrolled, with hyperglycemia ([place])   Plan   1. Atrial flutter, RVR: - s/p TEE/DCCV 09/23, maintaining SR w/ brief episodes ?flutter vs atrial tach - SBP 91-140 last 24 hr - on metoprolol 12.5 mg bid since 09/22, no doses missed but BP limits titration - Continue Eliquis - CHA2DS2-VASc = 2 (age x 1, DM)   2. Diabetes: A1c was 11.5 - per IM, being started on Lantus   Plan: no further cardiac workup, will arrange f/u [place]  Otherwise, per IM Principal Problem:   New onset type 2 diabetes mellitus ([place]) Active Problems:   Atrial flutter with rapid ventricular response (HCC)   Leukocytosis   Unintentional weight loss   Polyuria   Hypotension due to hypovolemia   Diabetes mellitus due to underlying condition, uncontrolled, with hyperglycemia ([place])   Rosaria Ferries, PA-C 08/23/2019 7:44 AM Beeper [id number]  Lenght of stay: 2

## 2019-08-23 NOTE — Progress Notes (Signed)
Inpatient Diabetes Program Recommendations  AACE/ADA: New Consensus Statement on Inpatient Glycemic Control (2015)  Target Ranges:  Prepandial:   less than 140 mg/dL      Peak postprandial:   less than 180 mg/dL (1-2 hours)      Critically ill patients:  140 - 180 mg/dL   Lab Results  Component Value Date   GLUCAP 145 (H) 08/23/2019   HGBA1C 11.5 (H) 08/20/2019    Review of Glycemic Control  Educated patient on insulin pen use at home. Reviewed all steps if insulin pen including attachment of needle, 2-unit air shot, dialing up dose, giving injection, removing needle, disposal of sharps, storage of unused insulin, disposal of insulin etc. Patient able to provide successful return demonstration. Also reviewed troubleshooting with insulin pen. MD to give patient Rxs for insulin pens and insulin pen needles.  Again discussed hypoglycemia s/s and treatment, glucose monitoring, healthy eating, eliminating sweet tea, regular soda and juice, exercising (walking daily). Concerned about getting PCP since he hasn't gone to [place] in 20 years.   Understands he will be giving himself both long-acting and rapid-acting insulin each day.  Inpatient Diabetes Program Recommendations:    For Home:  Lantus 20 units QD Novolog 4 units tidwc for meal coverage insulin Monitor blood sugars 3-4x/day and take logbook to MD  Discussed with Care Manager and RN.  Thank you. Lorenda Peck, RD, LDN, CDE Inpatient Diabetes Coordinator [phone]

## 2019-09-10 DIAGNOSIS — M4716 Other spondylosis with myelopathy, lumbar region: Secondary | ICD-10-CM | POA: Insufficient documentation

## 2019-09-11 ENCOUNTER — Encounter: Payer: Self-pay | Admitting: Critical Care Medicine

## 2019-09-11 ENCOUNTER — Other Ambulatory Visit: Payer: Self-pay

## 2019-09-11 ENCOUNTER — Ambulatory Visit
Payer: No Typology Code available for payment source | Attending: Critical Care Medicine | Admitting: Critical Care Medicine

## 2019-09-11 VITALS — BP 99/61 | HR 65 | Temp 98.2°F | Ht 72.0 in | Wt 189.0 lb

## 2019-09-11 DIAGNOSIS — E119 Type 2 diabetes mellitus without complications: Secondary | ICD-10-CM

## 2019-09-11 DIAGNOSIS — K056 Periodontal disease, unspecified: Secondary | ICD-10-CM | POA: Insufficient documentation

## 2019-09-11 DIAGNOSIS — Z13220 Encounter for screening for lipoid disorders: Secondary | ICD-10-CM

## 2019-09-11 DIAGNOSIS — E0865 Diabetes mellitus due to underlying condition with hyperglycemia: Secondary | ICD-10-CM

## 2019-09-11 DIAGNOSIS — R358 Other polyuria: Secondary | ICD-10-CM

## 2019-09-11 DIAGNOSIS — I4892 Unspecified atrial flutter: Secondary | ICD-10-CM

## 2019-09-11 DIAGNOSIS — K029 Dental caries, unspecified: Secondary | ICD-10-CM | POA: Insufficient documentation

## 2019-09-11 DIAGNOSIS — R634 Abnormal weight loss: Secondary | ICD-10-CM

## 2019-09-11 DIAGNOSIS — B351 Tinea unguium: Secondary | ICD-10-CM

## 2019-09-11 DIAGNOSIS — M4716 Other spondylosis with myelopathy, lumbar region: Secondary | ICD-10-CM

## 2019-09-11 DIAGNOSIS — D72829 Elevated white blood cell count, unspecified: Secondary | ICD-10-CM | POA: Diagnosis not present

## 2019-09-11 DIAGNOSIS — Z1159 Encounter for screening for other viral diseases: Secondary | ICD-10-CM

## 2019-09-11 DIAGNOSIS — R3589 Other polyuria: Secondary | ICD-10-CM

## 2019-09-11 DIAGNOSIS — F329 Major depressive disorder, single episode, unspecified: Secondary | ICD-10-CM | POA: Insufficient documentation

## 2019-09-11 MED ORDER — METOPROLOL TARTRATE 25 MG PO TABS: 12.5000 mg | ORAL_TABLET | Freq: Two times a day (BID) | ORAL | 2 refills | Status: DC

## 2019-09-11 MED ORDER — APIXABAN 5 MG PO TABS: 5.0000 mg | ORAL_TABLET | Freq: Two times a day (BID) | ORAL | 3 refills | Status: DC

## 2019-09-11 MED ORDER — METFORMIN HCL 1000 MG PO TABS: 1000.0000 mg | ORAL_TABLET | Freq: Two times a day (BID) | ORAL | 3 refills | Status: DC

## 2019-09-11 MED ORDER — TRESIBA FLEXTOUCH 100 UNIT/ML ~~LOC~~ SOPN: 15.0000 [IU] | PEN_INJECTOR | Freq: Every day | SUBCUTANEOUS | 6 refills | Status: DC

## 2019-09-11 MED ORDER — TRESIBA FLEXTOUCH 100 UNIT/ML ~~LOC~~ SOPN: 20.0000 [IU] | PEN_INJECTOR | Freq: Every day | SUBCUTANEOUS | 6 refills | Status: DC

## 2019-09-11 MED FILL — metFORMIN HCL 1000 MG TABS: 1000 | 30 days supply | Qty: 60 | Fill #0

## 2019-09-11 MED FILL — TRESIBA FLEXTOUCH 100 UNITS: 100 | 20 days supply | Qty: 3 | Fill #0

## 2019-09-11 NOTE — Assessment & Plan Note (Signed)
Significant diabetes type 2 uncontrolled with hyperglycemia  I had our clinical pharmacist see the patient today and between [place] we decided to begin trest via 15 units daily at bedtime and increase Glucophage to thousand milligrams twice daily and discontinue NPH  The patient will continue to monitor his blood sugars and we will see him back in short-term follow-up  Plan will also be to refer the patient to medical nutrition therapy and I also gave the patient a diet and education on diabetes today

## 2019-09-11 NOTE — Assessment & Plan Note (Signed)
Severe periodontal disease once again made a referral to dentistry

## 2019-09-11 NOTE — Assessment & Plan Note (Signed)
Polyuria likely secondary to diabetes will monitor

## 2019-09-11 NOTE — Assessment & Plan Note (Signed)
As per diabetes mellitus with underlying conditions uncontrolled

## 2019-09-11 NOTE — Assessment & Plan Note (Signed)
Severe periodontal disease and dental caries  I indicated to the patient he needs to seek dental care to have the teeth extracted and I gave him a list of dentists that he could access I indicated this would be important in improving control of his diabetes and will improve his long-term cardiovascular outcomes

## 2019-09-11 NOTE — Assessment & Plan Note (Signed)
History of atrial flutter rapid ventricular response now in sinus bradycardia  Note trans-esophageal echo was normal  Plan is to continue Eliquis 5 mg twice daily and continue metoprolol at 12.5 mg twice daily  The patient will need follow-up with cardiology and appointment will be made

## 2019-09-11 NOTE — Assessment & Plan Note (Signed)
We will follow-up magnesium level

## 2019-09-11 NOTE — Assessment & Plan Note (Signed)
Significant reactive depression  Plan will be for this patient to connect with our licensed clinical social worker for further counseling may yet need antidepressive medications

## 2019-09-11 NOTE — Progress Notes (Addendum)
Subjective:    Patient ID: Cameron Arroyo, male    DOB: [birth year], 68 y.o.   MRN: [id number]  68 y.o.M post hosp f/u and to establish PCP When I entered the room this patient was extremely angry and I had to spend at least 10 minutes of the visit initially de-escalating the patient's anger.  The patient was accompanied by his spouse who also was very frustrated.  Their statement is that when the patient was admitted from the 21st and 24 September due to the COVID restrictions the spouse could not visit and the patient felt that he was getting conflicting information between various providers who saw him during his short visit.  Also the patient was to receive a post hospital visit with cardiology but that appointment did not appear to be made as of yet. The patient had a former primary care provider at the [facility] site however is wishing to establish care.  Has not been seen by primary care provider in 6 years.  During the hospitalization the patient was found to have new onset diabetes with hemoglobin A1c of 11.5.  He was to go home on Lantus but his insurance would not cover this so he switched to NPH 15 units in the morning and 5 units in the evening.  He is also on 500 mg once daily of metformin.  The patient brings a very complete record of all of his weights blood pressures heart rate and blood sugars.  His blood sugars appear to range anywhere from a low of 1 10-1 20 to a high of 1 70-1 80.  His morning sugars tend to run lower his afternoon sugars tend to run higher.  The patient's weight has gone down he did suffer significant weight loss in the past.  He is 189 pounds today and note his CBG today is 172  The patient did have polyuria and polydipsia this is improved somewhat but is still occurring.  The patient was admitted he had atrial fibrillation atrial flutter with rapid ventricular response and was hypotensive secondary to hypovolemia with associated hypermagnesemia  As the  patient's electrolytes and fluids were replaced and replenished the patient improved.  Once again he was sent home on the NPH and metformin as noted above.  The patient was also cardioverted during this hospitalization and placed on low-dose metoprolol along with Eliquis twice daily  Below is a copy of the discharge summary  Patient: Cameron Arroyo                                                         Admit date: 08/20/2019   DOB: [birth year]                                                              Discharge date:08/23/2019/11:20 AM [id number]  Discharge Diagnoses:   Principal Problem:   New onset type 2 diabetes mellitus ([place]) Active Problems:   Atrial flutter with rapid ventricular response (HCC)   Leukocytosis   Unintentional weight loss   Polyuria   Hypotension due to hypovolemia   Diabetes mellitus due to underlying condition, uncontrolled, with hyperglycemia ([place])   History of Present Illness/ Hospital Course Cameron Arroyo Summary: Cameron Jenkins Jonesis a 68 y.o.malewithno significant past medical history came to the emergency department with multiple problems. He reports that he feels like crap, polyuria, dry mouth, decreased appetite, weight loss, shortness of breath, anxiety, no energy since 2 to 3 weeks. He went to [facility] health urgent care initially where he was found to have atrial flutter with rapid response and was advised to go to the emergency department for further evaluation and management. He reports shortness of breath but denies association with chest pain, palpitation, headache, blurry vision, lightheadedness, dizziness, nausea, vomiting, epigastric pain, diarrhea, constipation, fever, chills, decreased appetite, night sweats, family history of thyroid issues, colon cancer, hematemesis, melena,  over-the-counter use of NSAIDs. Reports unintentional weight loss of 30 pounds in the last 1 year. He is a former smoker however denies alcohol, illicit drug use. He was prescribed steroids by his PCP recently for left knee pain. Patient reports that his left knee pain is better.   ED Course:Patient was found to have rapid narrow complex tachycardia suggestive of atrial flutter.Hyperglycemia of blood glucose 430, and leukocytosis of 12.7. Initial troponin x1-. Magnesium level elevated at 2.5. Chest x-ray came back negative. COVID-19: Pending. CBC: H&H is stable. Kidney function: WNL.[name] Kitchen Started on IV diltiazem and IV heparin.Transthoracic echo was ordered which was not performed due to patient's tachycardia.  Briefly patient was admitted with atrial flutter, status post TEE cardioversion which was successful on 08/22/2019 currently in normal sinus rhythm.  Per cardiology patient started on metoprolol and Eliquis.  Patient was noted from new onset diabetes mellitus with a hemoglobin A1c of 11.5.  Patient started on Lantus and metformin.  He was educated on diabetes, diet, instructed to check his blood sugars 3 times daily-4 times daily, keep a log for adjustment and titration of the medication for better glycemic control.  Patient was deemed stable was discharged home.   Discharge summary/Assessment & Plan:   Hyperglycemia/ newly diagnosed diabetesmellitus, POA: -Blood sugar more than 400 upon arrival -A1C 11.5 -Patient presented with polyuria, dry mouth, weight loss, fatigue -will start Lantus 20u / day and SSI/started on metformin 500 mg -Patient was given glucometer, instructed to check blood sugars 3 times daily-4 times daily, keep close log... Follow with PCP with adjustment of medication accordingly.  -Status post volume repletion -consult diabetes team for education; dietary following  Atrial flutter, asymptomatic, POA: -Patientasymptomatic regarding  arrhythmia -Patient heart rates more than 150s - trop neg and CXR negative - Thyroid function studyWNL - seen by cardiology, on IV heparin and IV diltiazem now- pendingTEE/cardioversionOn  08/22/19 -Cardioversion was successful, patient is currently in normal sinus rhythm -Per cardiology patient was started on metoprolol and Eliquis prescription were provided  Leukocytosis,reactive, resolved: -Likely reactive given recent steroid use, A. fib/flutter and hyperglycemia -Resolved no signs of infection.  Unintentional weight loss: -Reported loss of30 pounds in 1 year. -Likely in the setting of poorly controlled diabetes with A1c of 11.5 -Patient was instructed to keep an close log of his weight changes.   Code Status:Partial, DO NOT INTUBATE Family Communication:None present Disposition Plan: Patient will be discharged home, with close follow with  PCP and cardiologist in 1 to 2 weeks.  Consultants:  Cardiology  Procedures:  Status post tentative cardioversion on 08/22/2019 currently on normal sinus rhythm    Note the patient has also recently been seen by sports medicine physician in the [facility] here in [place] who is referring him to Dr. Sherwood Gambler of neurosurgery along with an MRI because of severe low back pain.  Note the note the neurosurgeon had done cervical spine surgery in the past.  Below is the office note and plan from that visit Low back pain lumbar issue saw sports medicine 09/11/19: Assessment Note I had a long discussion with the patient. He is quite miserable today. I do believe this is all emanating from his lumbar spine. He is now greater than 6 weeks into conservative care and not improving at all. The prednisone did not help and he reports may have made him worse. I will place him on to gabapentin and titrate up slowly as tolerated. He will monitor for sedation. We will obtain an MRI scan of his lumbar spine. He would like to be  referred to Dr. Sherwood Gambler for further treatment. I will go and see if I can make the arrangements for him. He is quite frustrated with his pain and would like to see all of his options. All questions were encouraged and answered.   1. Low back pain  XR, lumbar spine   MRI, lumbar spine, w/o contrast - mri lumbar spine r/o hnp   magnetic resonance imaging (MRI): about this test  G-MRI Self Disclosure Notice  neurosurgery referral  2. Pain in left knee 3. Lumbar spondylosis with myelopathy  gabapentin 300 mg capsule    The patient also shared with me his diet plan and all of his meals are carefully written down by the spouse.  Note that he is eating a lot of potatoes and grits with his meals.  Note also the patient's PHQ 9 is extremely high during this visit and it is clear that he is experiencing some degree of frustration with healthcare system and has been experiencing quite a bit of anxiety and depression.  Past Medical History:  Diagnosis Date   Asthma    as child   Atrial flutter with rapid ventricular response ([place]) 08/20/2019   New onset type 2 diabetes mellitus ([place]) 08/20/2019     Family History  Problem Relation Age of Onset   Cirrhosis Mother    Cancer Father      Social History   Socioeconomic History   Marital status: Married    Spouse name: Not on file   Number of children: Not on file   Years of education: Not on file   Highest education level: Not on file  Occupational History   Not on file  Social Needs   Financial resource strain: Not on file   Food insecurity    Worry: Not on file    Inability: Not on file   Transportation needs    Medical: Not on file    Non-medical: Not on file  Tobacco Use   Smoking status: Former Smoker    Quit date: 11/29/1992    Years since quitting: 26.8   Smokeless tobacco: Never Used  Substance and Sexual Activity   Alcohol use: No   Drug use: Never   Sexual activity: Not on file  Lifestyle    Physical activity    Days per week: Not on file    Minutes per session: Not on file  Stress: Not on file  Relationships   Social connections    Talks on phone: Not on file    Gets together: Not on file    Attends religious service: Not on file    Active member of club or organization: Not on file    Attends meetings of clubs or organizations: Not on file    Relationship status: Not on file   Intimate partner violence    Fear of current or ex partner: Not on file    Emotionally abused: Not on file    Physically abused: Not on file    Forced sexual activity: Not on file  Other Topics Concern   Not on file  Social History Narrative   Not on file     No Known Allergies   Outpatient Medications Prior to Visit  Medication Sig Dispense Refill   BD PEN NEEDLE NANO U/F 32G X 4 MM MISC See admin instructions. use with insulin pen     gabapentin (NEURONTIN) 300 MG capsule gabapentin 300 mg capsule  Take 1 capsule 3 times a day by oral route.     apixaban (ELIQUIS) 5 MG TABS tablet Take 1 tablet (5 mg total) by mouth 2 (two) times daily. 60 tablet 3   gabapentin (NEURONTIN) 300 MG capsule      Insulin NPH, Human,, Isophane, (NOVOLIN N FLEXPEN) 100 UNIT/ML Kiwkpen Inject 15 units in the morning with breakfast and 5 units in the evening with supper 15 mL 11   metFORMIN (GLUCOPHAGE) 500 MG tablet Take 1 tablet (500 mg total) by mouth daily with breakfast. 30 tablet 2   metoprolol tartrate (LOPRESSOR) 25 MG tablet Take 0.5 tablets (12.5 mg total) by mouth 2 (two) times daily. 30 tablet 2   No facility-administered medications prior to visit.       Review of Systems  Pos in BOLD Constitutional:   o  weight loss, night sweats,  Fevers, chills, fatigue, lassitude. HEENT:   No headaches,  Difficulty swallowing,  Tooth/dental problems,  Sore throat,                No sneezing, itching, ear ache, nasal congestion, post nasal drip,   CV:  No chest pain,  Orthopnea, PND, swelling  in lower extremities, anasarca, dizziness, palpitations  GI  No heartburn, indigestion, abdominal pain, nausea, vomiting, diarrhea, change in bowel habits, loss of appetite  Resp: No shortness of breath with exertion or at rest.  No excess mucus, no productive cough,  No non-productive cough,  No coughing up of blood.  No change in color of mucus.  No wheezing.  No chest wall deformity  Skin: no rash or lesions.  GU: no dysuria, change in color of urine, no urgency or frequency.  No flank pain. polyuria  MS:  No joint pain or swelling.  No decreased range of motion.   back pain.  Psych:   change in mood or affect.  depression or anxiety.  No memory loss.     Objective:   Physical Exam Vitals:   09/11/19 0948  BP: 99/61  Pulse: 65  Temp: 98.2 F (36.8 C)  TempSrc: Oral  SpO2: 97%  Weight: 189 lb (85.7 kg)  Height: 6' (1.829 m)    Wt Readings from Last 3 Encounters:  09/11/19 189 lb (85.7 kg)  08/23/19 191 lb 11.2 oz (87 kg)  02/23/13 226 lb 13.7 oz (102.9 kg)    Gen: angry frustrated male , thin , low muscle mass,  in no distress,  Angry agitated affect  ENT: No lesions,  mouth clear,  oropharynx clear, no postnasal drip Severe periodontal disease and carious teeth  Neck: No JVD, no TMG, no carotid bruits  Lungs: No use of accessory muscles, no dullness to percussion, clear without rales or rhonchi  Cardiovascular: RRR, heart sounds normal, no murmur or gallops, no peripheral edema  Abdomen: soft and NT, no HSM,  BS normal  Musculoskeletal: No deformities, no cyanosis or clubbing  Neuro: alert, non focal  Skin: Warm, no lesions or rashes FOOT EXAM NORMAL, bilateral toenail thickening.  No ulcers.  ECG 09/11/19:   Sinus bradycardia,  PACs otherwise normal  CXR 9/21: NAD  TEE: FINDINGS:  LEFT VENTRICLE: EF = 50-55%  RIGHT VENTRICLE: Normal size and function.   LEFT ATRIUM: No thrombus/mass.  LEFT ATRIAL APPENDAGE: No thrombus/mass.   RIGHT  ATRIUM: No thrombus/mass.  AORTIC VALVE:  Trileaflet. No regurgitation.   MITRAL VALVE:    Normal structure. Mild regurgitation.   TRICUSPID VALVE: Normal structure. No regurgitation.   PULMONIC VALVE: Grossly normal structure. No regurgitation. No apparent vegetation.  INTERATRIAL SEPTUM: No PFO or ASD seen by color Doppler.  PERICARDIUM: No effusion noted.  DESCENDING AORTA: Mild plaque    CARDIOVERSION:     Indications:  Symptomatic Atrial Flutter  Procedure Details:  Once the TEE was complete, the patient had the defibrillator pads placed in the anterior and posterior position. Once an appropriate level of sedation was confirmed, the patient was cardioverted x 1 with 100J of biphasic synchronized energy.  The patient converted to NSR.  There were no apparent complications.  The patient had normal neuro status and respiratory status post procedure with vitals stable as recorded elsewhere.  Adequate airway was maintained throughout and vital signs monitored per protocol.        Assessment & Plan:  I personally reviewed all images and lab data in the [facility] system as well as any outside material available during this office visit and agree with the  radiology impressions.   Atrial flutter with rapid ventricular response (HCC) History of atrial flutter rapid ventricular response now in sinus bradycardia  Note trans-esophageal echo was normal  Plan is to continue Eliquis 5 mg twice daily and continue metoprolol at 12.5 mg twice daily  The patient will need follow-up with cardiology and appointment will be made  Dental caries Severe periodontal disease and dental caries  I indicated to the patient he needs to seek dental care to have the teeth extracted and I gave him a list of dentists that he could access I indicated this would be important in improving control of his diabetes and will improve his long-term cardiovascular outcomes  Periodontal disease Severe  periodontal disease once again made a referral to dentistry  Diabetes mellitus due to underlying condition, uncontrolled, with hyperglycemia ([place]) Significant diabetes type 2 uncontrolled with hyperglycemia  I had our clinical pharmacist see the patient today and between [place] we decided to begin trest via 15 units daily at bedtime and increase Glucophage to thousand milligrams twice daily and discontinue NPH  The patient will continue to monitor his blood sugars and we will see him back in short-term follow-up  Plan will also be to refer the patient to medical nutrition therapy and I also gave the patient a diet and education on diabetes today  New onset type 2 diabetes mellitus ([place]) As per diabetes mellitus with underlying conditions uncontrolled  Lumbar spondylosis with myelopathy Lumbar spondylosis with  myelopathy as per neurosurgery we will continue gabapentin for now  Toenail fungus Significant bilateral toenail fungus with toenail thickening of both great toes will refer to podiatry  Hypermagnesemia We will follow-up magnesium level  Leukocytosis We will follow-up CBC  Polyuria Polyuria likely secondary to diabetes will monitor  Reactive depression Significant reactive depression  Plan will be for this patient to connect with our licensed clinical social worker for further counseling may yet need antidepressive medications  Unintentional weight loss Unintentional weight loss is on the basis of diabetes   Referral to nutritional therapy made   Cameron Arroyo was seen today for hospitalization follow-up.  Diagnoses and all orders for this visit:  Atrial flutter with rapid ventricular response ([place]) -     Ambulatory referral to Cardiology -     Comprehensive metabolic panel -     EKG [id number]  Need for hepatitis C screening test -     Hepatitis C antibody  Diabetes mellitus due to underlying condition, uncontrolled, with hyperglycemia (HCC) -     Amb ref to Medical  Nutrition Therapy-MNT -     Comprehensive metabolic panel -     CBC with Differential/Platelet; Future -     Microalbumin, urine -     Lipid Panel  New onset type 2 diabetes mellitus (HCC) -     Amb ref to Medical Nutrition Therapy-MNT -     Comprehensive metabolic panel -     CBC with Differential/Platelet; Future -     Microalbumin, urine -     Lipid Panel  Lipid screening -     Lipid Panel  Unintentional weight loss  Lumbar spondylosis with myelopathy  Hypermagnesemia -     Magnesium; Future -     Magnesium  Polyuria  Reactive depression  Toenail fungus -     Ambulatory referral to Podiatry  Periodontal disease  Dental caries  Leukocytosis, unspecified type  Other orders -     apixaban (ELIQUIS) 5 MG TABS tablet; Take 1 tablet (5 mg total) by mouth 2 (two) times daily. -     Discontinue: insulin degludec (TRESIBA FLEXTOUCH) 100 UNIT/ML SOPN FlexTouch Pen; Inject 0.2 mLs (20 Units total) into the skin daily. -     metFORMIN (GLUCOPHAGE) 1000 MG tablet; Take 1 tablet (1,000 mg total) by mouth 2 (two) times daily with a meal. -     metoprolol tartrate (LOPRESSOR) 25 MG tablet; Take 0.5 tablets (12.5 mg total) by mouth 2 (two) times daily. -     insulin degludec (TRESIBA FLEXTOUCH) 100 UNIT/ML SOPN FlexTouch Pen; Inject 0.15 mLs (15 Units total) into the skin at bedtime.   I will check a hepatitis C level and urine for microalbumin however the patient was not interested in any vaccines today will address this at the next visit

## 2019-09-11 NOTE — Assessment & Plan Note (Signed)
Unintentional weight loss is on the basis of diabetes   Referral to nutritional therapy made

## 2019-09-11 NOTE — Patient Instructions (Addendum)
All medications were refilled and sent to our pharmacy  Discontinue NPH and   Begin Tresbia insulin 15 units at bedtime  Increase metformin to 1000 mg twice daily  Labs today will include urine for microalbumin, complete blood count, metabolic panel, and lipid panel, hepatitis C screening study  Follow-up visit with Lurena Joiner our clinical pharmacist whom you met today we made and we will also make an appointment with the diabetic nutritionist  Because of your high depression score I would like our clinical social worker Delana Meyer to have a visit with you to discuss your depression  An appointment with cardiology will be made  A podiatry referral will be made  Recommend dentist to evaluate teeth removal  Return to see Dr. Joya Gaskins in 3 weeks  Follow up with Lurena Joiner in 2 weeks  Follow diabetic diet as outlined below   Diabetes Mellitus and Nutrition, Adult When you have diabetes (diabetes mellitus), it is very important to have healthy eating habits because your blood sugar (glucose) levels are greatly affected by what you eat and drink. Eating healthy foods in the appropriate amounts, at about the same times every day, can help you:  Control your blood glucose.  Lower your risk of heart disease.  Improve your blood pressure.  Reach or maintain a healthy weight. Every person with diabetes is different, and each person has different needs for a meal plan. Your health care provider may recommend that you work with a diet and nutrition specialist (dietitian) to make a meal plan that is best for you. Your meal plan may vary depending on factors such as:  The calories you need.  The medicines you take.  Your weight.  Your blood glucose, blood pressure, and cholesterol levels.  Your activity level.  Other health conditions you have, such as heart or kidney disease. How do carbohydrates affect me? Carbohydrates, also called carbs, affect your blood glucose level more than any other type  of food. Eating carbs naturally raises the amount of glucose in your blood. Carb counting is a method for keeping track of how many carbs you eat. Counting carbs is important to keep your blood glucose at a healthy level, especially if you use insulin or take certain oral diabetes medicines. It is important to know how many carbs you can safely have in each meal. This is different for every person. Your dietitian can help you calculate how many carbs you should have at each meal and for each snack. Foods that contain carbs include:  Bread, cereal, rice, pasta, and crackers.  Potatoes and corn.  Peas, beans, and lentils.  Milk and yogurt.  Fruit and juice.  Desserts, such as cakes, cookies, ice cream, and candy. How does alcohol affect me? Alcohol can cause a sudden decrease in blood glucose (hypoglycemia), especially if you use insulin or take certain oral diabetes medicines. Hypoglycemia can be a life-threatening condition. Symptoms of hypoglycemia (sleepiness, dizziness, and confusion) are similar to symptoms of having too much alcohol. If your health care provider says that alcohol is safe for you, follow these guidelines:  Limit alcohol intake to no more than 1 drink per day for nonpregnant women and 2 drinks per day for men. One drink equals 12 oz of beer, 5 oz of wine, or 1 oz of hard liquor.  Do not drink on an empty stomach.  Keep yourself hydrated with water, diet soda, or unsweetened iced tea.  Keep in mind that regular soda, juice, and other mixers may contain a  lot of sugar and must be counted as carbs. What are tips for following this plan?  Reading food labels  Start by checking the serving size on the "Nutrition Facts" label of packaged foods and drinks. The amount of calories, carbs, fats, and other nutrients listed on the label is based on one serving of the item. Many items contain more than one serving per package.  Check the total grams (g) of carbs in one  serving. You can calculate the number of servings of carbs in one serving by dividing the total carbs by 15. For example, if a food has 30 g of total carbs, it would be equal to 2 servings of carbs.  Check the number of grams (g) of saturated and trans fats in one serving. Choose foods that have low or no amount of these fats.  Check the number of milligrams (mg) of salt (sodium) in one serving. Most people should limit total sodium intake to less than 2,300 mg per day.  Always check the nutrition information of foods labeled as "low-fat" or "nonfat". These foods may be higher in added sugar or refined carbs and should be avoided.  Talk to your dietitian to identify your daily goals for nutrients listed on the label. Shopping  Avoid buying canned, premade, or processed foods. These foods tend to be high in fat, sodium, and added sugar.  Shop around the outside edge of the grocery store. This includes fresh fruits and vegetables, bulk grains, fresh meats, and fresh dairy. Cooking  Use low-heat cooking methods, such as baking, instead of high-heat cooking methods like deep frying.  Cook using healthy oils, such as olive, canola, or sunflower oil.  Avoid cooking with butter, cream, or high-fat meats. Meal planning  Eat meals and snacks regularly, preferably at the same times every day. Avoid going long periods of time without eating.  Eat foods high in fiber, such as fresh fruits, vegetables, beans, and whole grains. Talk to your dietitian about how many servings of carbs you can eat at each meal.  Eat 4-6 ounces (oz) of lean protein each day, such as lean meat, chicken, fish, eggs, or tofu. One oz of lean protein is equal to: ? 1 oz of meat, chicken, or fish. ? 1 egg. ?  cup of tofu.  Eat some foods each day that contain healthy fats, such as avocado, nuts, seeds, and fish. Lifestyle  Check your blood glucose regularly.  Exercise regularly as told by your health care provider.  This may include: ? 150 minutes of moderate-intensity or vigorous-intensity exercise each week. This could be brisk walking, biking, or water aerobics. ? Stretching and doing strength exercises, such as yoga or weightlifting, at least 2 times a week.  Take medicines as told by your health care provider.  Do not use any products that contain nicotine or tobacco, such as cigarettes and e-cigarettes. If you need help quitting, ask your health care provider.  Work with a Social worker or diabetes educator to identify strategies to manage stress and any emotional and social challenges. Questions to ask a health care provider  Do I need to meet with a diabetes educator?  Do I need to meet with a dietitian?  What number can I call if I have questions?  When are the best times to check my blood glucose? Where to find more information:  American Diabetes Association: diabetes.org  Academy of Nutrition and Dietetics: www.eatright.[employer] of Diabetes and Digestive and Kidney Diseases (NIH): [employer]  Summary  A healthy meal plan will help you control your blood glucose and maintain a healthy lifestyle.  Working with a diet and nutrition specialist (dietitian) can help you make a meal plan that is best for you.  Keep in mind that carbohydrates (carbs) and alcohol have immediate effects on your blood glucose levels. It is important to count carbs and to use alcohol carefully. This information is not intended to replace advice given to you by your health care provider. Make sure you discuss any questions you have with your health care provider. Document Released: 08/12/2005 Document Revised: 10/28/2017 Document Reviewed: 12/20/2016 Elsevier Patient Education  2020 [employer].  Diabetes Basics  Diabetes (diabetes mellitus) is a long-term (chronic) disease. It occurs when the body does not properly use sugar (glucose) that is released from food after you eat. Diabetes  may be caused by one or both of these problems:  Your pancreas does not make enough of a hormone called insulin.  Your body does not react in a normal way to insulin that it makes. Insulin lets sugars (glucose) go into cells in your body. This gives you energy. If you have diabetes, sugars cannot get into cells. This causes high blood sugar (hyperglycemia). Follow these instructions at home: How is diabetes treated? You may need to take insulin or other diabetes medicines daily to keep your blood sugar in balance. Take your diabetes medicines every day as told by your doctor. List your diabetes medicines here: Diabetes medicines  Name of medicine: ______________________________ ? Amount (dose): _______________ Time (a.m./p.m.): _______________ Notes: ___________________________________  Name of medicine: ______________________________ ? Amount (dose): _______________ Time (a.m./p.m.): _______________ Notes: ___________________________________  Name of medicine: ______________________________ ? Amount (dose): _______________ Time (a.m./p.m.): _______________ Notes: ___________________________________ If you use insulin, you will learn how to give yourself insulin by injection. You may need to adjust the amount based on the food that you eat. List the types of insulin you use here: Insulin  Insulin type: ______________________________ ? Amount (dose): _______________ Time (a.m./p.m.): _______________ Notes: ___________________________________  Insulin type: ______________________________ ? Amount (dose): _______________ Time (a.m./p.m.): _______________ Notes: ___________________________________  Insulin type: ______________________________ ? Amount (dose): _______________ Time (a.m./p.m.): _______________ Notes: ___________________________________  Insulin type: ______________________________ ? Amount (dose): _______________ Time (a.m./p.m.): _______________ Notes:  ___________________________________  Insulin type: ______________________________ ? Amount (dose): _______________ Time (a.m./p.m.): _______________ Notes: ___________________________________ How do I manage my blood sugar?  Check your blood sugar levels using a blood glucose monitor as directed by your doctor. Your doctor will set treatment goals for you. Generally, you should have these blood sugar levels:  Before meals (preprandial): 80-130 mg/dL (4.4-7.2 mmol/L).  After meals (postprandial): below 180 mg/dL (10 mmol/L).  A1c level: less than 7%. Write down the times that you will check your blood sugar levels: Blood sugar checks  Time: _______________ Notes: ___________________________________  Time: _______________ Notes: ___________________________________  Time: _______________ Notes: ___________________________________  Time: _______________ Notes: ___________________________________  Time: _______________ Notes: ___________________________________  Time: _______________ Notes: ___________________________________  What do I need to know about low blood sugar? Low blood sugar is called hypoglycemia. This is when blood sugar is at or below 70 mg/dL (3.9 mmol/L). Symptoms may include:  Feeling: ? Hungry. ? Worried or nervous (anxious). ? Sweaty and clammy. ? Confused. ? Dizzy. ? Sleepy. ? Sick to your stomach (nauseous).  Having: ? A fast heartbeat. ? A headache. ? A change in your vision. ? Tingling or no feeling (numbness) around the mouth, lips, or tongue. ? Jerky movements that you cannot control (  seizure).  Having trouble with: ? Moving (coordination). ? Sleeping. ? Passing out (fainting). ? Getting upset easily (irritability). Treating low blood sugar To treat low blood sugar, eat or drink something sugary right away. If you can think clearly and swallow safely, follow the 15:15 rule:  Take 15 grams of a fast-acting carb (carbohydrate). Talk with  your doctor about how much you should take.  Some fast-acting carbs are: ? Sugar tablets (glucose pills). Take 3-4 glucose pills. ? 6-8 pieces of hard candy. ? 4-6 oz (120-150 mL) of fruit juice. ? 4-6 oz (120-150 mL) of regular (not diet) soda. ? 1 Tbsp (15 mL) honey or sugar.  Check your blood sugar 15 minutes after you take the carb.  If your blood sugar is still at or below 70 mg/dL (3.9 mmol/L), take 15 grams of a carb again.  If your blood sugar does not go above 70 mg/dL (3.9 mmol/L) after 3 tries, get help right away.  After your blood sugar goes back to normal, eat a meal or a snack within 1 hour. Treating very low blood sugar If your blood sugar is at or below 54 mg/dL (3 mmol/L), you have very low blood sugar (severe hypoglycemia). This is an emergency. Do not wait to see if the symptoms will go away. Get medical help right away. Call your local emergency services (911 in the [place] Do not drive yourself to the hospital. Questions to ask your health care provider  Do I need to meet with a diabetes educator?  What equipment will I need to care for myself at home?  What diabetes medicines do I need? When should I take them?  How often do I need to check my blood sugar?  What number can I call if I have questions?  When is my next doctor's visit?  Where can I find a support group for people with diabetes? Where to find more information  American Diabetes Association: www.diabetes.org  American Association of Diabetes Educators: www.diabeteseducator.org/patient-resources Contact a doctor if:  Your blood sugar is at or above 240 mg/dL (13.3 mmol/L) for 2 days in a row.  You have been sick or have had a fever for 2 days or more, and you are not getting better.  You have any of these problems for more than 6 hours: ? You cannot eat or drink. ? You feel sick to your stomach (nauseous). ? You throw up (vomit). ? You have watery poop (diarrhea). Get help right away  if:  Your blood sugar is lower than 54 mg/dL (3 mmol/L).  You get confused.  You have trouble: ? Thinking clearly. ? Breathing. Summary  Diabetes (diabetes mellitus) is a long-term (chronic) disease. It occurs when the body does not properly use sugar (glucose) that is released from food after digestion.  Take insulin and diabetes medicines as told.  Check your blood sugar every day, as often as told.  Keep all follow-up visits as told by your doctor. This is important. This information is not intended to replace advice given to you by your health care provider. Make sure you discuss any questions you have with your health care provider. Document Released: 02/17/2018 Document Revised: 01/05/2019 Document Reviewed: 02/17/2018 Elsevier Patient Education  2020 [employer].

## 2019-09-11 NOTE — Assessment & Plan Note (Signed)
Significant bilateral toenail fungus with toenail thickening of both great toes will refer to podiatry

## 2019-09-11 NOTE — Assessment & Plan Note (Signed)
Lumbar spondylosis with myelopathy as per neurosurgery we will continue gabapentin for now

## 2019-09-11 NOTE — Assessment & Plan Note (Signed)
We will follow-up CBC 

## 2019-09-12 ENCOUNTER — Other Ambulatory Visit: Payer: Self-pay | Admitting: Pharmacist

## 2019-09-12 ENCOUNTER — Ambulatory Visit: Payer: Medicare Other | Attending: Family Medicine | Admitting: Licensed Clinical Social Worker

## 2019-09-12 ENCOUNTER — Other Ambulatory Visit: Payer: Self-pay | Admitting: Critical Care Medicine

## 2019-09-12 DIAGNOSIS — E1169 Type 2 diabetes mellitus with other specified complication: Secondary | ICD-10-CM | POA: Insufficient documentation

## 2019-09-12 DIAGNOSIS — E785 Hyperlipidemia, unspecified: Secondary | ICD-10-CM

## 2019-09-12 DIAGNOSIS — F4323 Adjustment disorder with mixed anxiety and depressed mood: Secondary | ICD-10-CM

## 2019-09-12 LAB — LIPID PANEL
Chol/HDL Ratio: 4.1 ratio (ref 0.0–5.0)
Cholesterol, Total: 168 mg/dL (ref 100–199)
HDL: 41 mg/dL (ref >39)
LDL Chol Calc (NIH): 100 mg/dL — ABNORMAL HIGH (ref 0–99)
Triglycerides: 153 mg/dL — ABNORMAL HIGH (ref 0–149)
VLDL Cholesterol Cal: 27 mg/dL (ref 5–40)

## 2019-09-12 LAB — COMPREHENSIVE METABOLIC PANEL
ALT: 26 IU/L (ref 0–44)
AST: 17 IU/L (ref 0–40)
Albumin/Globulin Ratio: 2.6 — ABNORMAL HIGH (ref 1.2–2.2)
Albumin: 4.4 g/dL (ref 3.8–4.8)
Alkaline Phosphatase: 65 IU/L (ref 39–117)
BUN/Creatinine Ratio: 16 (ref 10–24)
BUN: 14 mg/dL (ref 8–27)
Bilirubin Total: 0.4 mg/dL (ref 0.0–1.2)
CO2: 22 mmol/L (ref 20–29)
Calcium: 9.2 mg/dL (ref 8.6–10.2)
Chloride: 106 mmol/L (ref 96–106)
Creatinine, Ser: 0.88 mg/dL (ref 0.76–1.27)
GFR calc Af Amer: 103 mL/min/{1.73_m2} (ref >59)
GFR calc non Af Amer: 89 mL/min/{1.73_m2} (ref >59)
Globulin, Total: 1.7 g/dL (ref 1.5–4.5)
Glucose: 144 mg/dL — ABNORMAL HIGH (ref 65–99)
Potassium: 4.2 mmol/L (ref 3.5–5.2)
Sodium: 141 mmol/L (ref 134–144)
Total Protein: 6.1 g/dL (ref 6.0–8.5)

## 2019-09-12 LAB — HEPATITIS C ANTIBODY: Hep C Virus Ab: <0.1 s/co ratio (ref 0.0–0.9)

## 2019-09-12 LAB — MICROALBUMIN, URINE: Microalbumin, Urine: 3.7 ug/mL

## 2019-09-12 MED ORDER — ATORVASTATIN CALCIUM 10 MG PO TABS: 10.0000 mg | ORAL_TABLET | Freq: Every day | ORAL | 2 refills | Status: DC

## 2019-09-12 MED ORDER — METOPROLOL TARTRATE 25 MG PO TABS: 12.5000 mg | ORAL_TABLET | Freq: Two times a day (BID) | ORAL | 2 refills | Status: DC

## 2019-09-12 MED ORDER — APIXABAN 5 MG PO TABS: 5.0000 mg | ORAL_TABLET | Freq: Two times a day (BID) | ORAL | 3 refills | Status: DC

## 2019-09-12 MED FILL — ATORVASTATIN 10 MG TABLET: 10 | 30 days supply | Qty: 30 | Fill #0

## 2019-09-12 NOTE — Progress Notes (Signed)
  Atorvastatin Rx for LDL elevation

## 2019-09-13 ENCOUNTER — Other Ambulatory Visit: Payer: Self-pay | Admitting: Pharmacist

## 2019-09-13 ENCOUNTER — Telehealth: Payer: Self-pay | Admitting: Critical Care Medicine

## 2019-09-13 LAB — MAGNESIUM: Magnesium: 2.2 mg/dL (ref 1.6–2.3)

## 2019-09-13 LAB — SPECIMEN STATUS REPORT

## 2019-09-13 MED ORDER — APIXABAN 5 MG PO TABS: 5.0000 mg | ORAL_TABLET | Freq: Two times a day (BID) | ORAL | 1 refills | Status: DC

## 2019-09-13 MED ORDER — ATORVASTATIN CALCIUM 10 MG PO TABS: 10.0000 mg | ORAL_TABLET | Freq: Every day | ORAL | 0 refills | Status: DC

## 2019-09-13 MED ORDER — METOPROLOL TARTRATE 25 MG PO TABS: 12.5000 mg | ORAL_TABLET | Freq: Two times a day (BID) | ORAL | 0 refills | Status: DC

## 2019-09-13 MED ORDER — TRESIBA FLEXTOUCH 100 UNIT/ML ~~LOC~~ SOPN: 15.0000 [IU] | PEN_INJECTOR | Freq: Every day | SUBCUTANEOUS | 0 refills | Status: DC

## 2019-09-13 NOTE — Telephone Encounter (Signed)
    Resent prescriptions for 90-day supplies due to insurance preference.

## 2019-09-13 NOTE — Progress Notes (Signed)
Resent prescriptions for 90-day supplies due to insurance preference.

## 2019-09-13 NOTE — Telephone Encounter (Signed)
New Message   Pt states he has questions about some medication he was prescribed. Please f/u

## 2019-09-14 ENCOUNTER — Ambulatory Visit: Payer: 59 | Admitting: Podiatry

## 2019-09-14 ENCOUNTER — Encounter: Payer: Self-pay | Admitting: Podiatry

## 2019-09-14 ENCOUNTER — Other Ambulatory Visit: Payer: Self-pay

## 2019-09-14 DIAGNOSIS — M79675 Pain in left toe(s): Secondary | ICD-10-CM

## 2019-09-14 DIAGNOSIS — E0865 Diabetes mellitus due to underlying condition with hyperglycemia: Secondary | ICD-10-CM

## 2019-09-14 DIAGNOSIS — B351 Tinea unguium: Secondary | ICD-10-CM | POA: Diagnosis not present

## 2019-09-14 DIAGNOSIS — M79674 Pain in right toe(s): Secondary | ICD-10-CM | POA: Diagnosis not present

## 2019-09-14 NOTE — Progress Notes (Signed)
Integrated Behavioral Health Initial Visit  MRN: [id number] Name: Cameron Arroyo  Number of [facility] Clinician visits:: 1/6 Session Start time: 1:30 PM  Session End time: 2:00 PM Total time: 30 minutes  Type of Service: [place] Interpretor:No. Interpretor Name and Language: na  SUBJECTIVE: Cameron Arroyo is a 68 y.o. male accompanied by Spouse Patient was referred by Dr. Joya Gaskins for depression and anxiety. Patient reports the following symptoms/concerns: Pt was recently diagnosed with diabetes. He is experiencing symptoms of depression and anxiety Duration of problem: 1 month; Severity of problem: severe  OBJECTIVE: Mood: Anxious and Irritable and Affect: Appropriate Risk of harm to self or others: No plan to harm self or others Pt scored positive on phq9; however, denies current SI/HI Protective factors identified, crisis intervention resources provided  LIFE CONTEXT: Family and Social: Pt receives strong support from spouse. He has family that resides locally School/Work: Pt receives Estate agent Self-Care: No report of substance use Life Changes: Pt was recently diagnosed with diabetes. He is experiencing symptoms of depression and anxiety  GOALS ADDRESSED: Patient will: 1. Reduce symptoms of: agitation, anxiety and depression 2. Increase knowledge and/or ability of: coping skills and healthy habits  3. Demonstrate ability to: Increase healthy adjustment to current life circumstances and Increase motivation to adhere to plan of care  INTERVENTIONS: Interventions utilized: Solution-Focused Strategies, Supportive Counseling and Psychoeducation and/or Health Education  Standardized Assessments completed: GAD-7 and PHQ 2&9 with C-SSRS  ASSESSMENT: Patient currently experiencing depression and anxiety triggered by recent diagnosis of diabetes. He reports chronic pain and difficulty staying compliant to dietary changes. Pt  receives strong support from spouse, who has been helping by maintaining food log and monitoring blood sugars.   Patient may benefit from psychotherapy and medication management. Pt is not interested in services at this time. LCSW provided support and encouragement. Encouraged self-care activities and provided supportive information on maintaining healthy diet. Reading materials were provided.   PLAN: 1. Follow up with behavioral health clinician on : Schedule follow up appt if needed 2. Behavioral recommendations: Utilize strategies discussed 3. Referral(s): [facility] (In Clinic) 4. "From scale of 1-10, how likely are you to follow plan?":   Rebekah Chesterfield, LCSW 09/14/2019 5:08 PM

## 2019-09-14 NOTE — Progress Notes (Signed)
This patient presents to the office with chief complaint of long thick nails and diabetic feet.  This patient  says there  is  no pain and discomfort in his  Feet today..  This patient says there are long thick painful nails both big toenails..  These nails are painful walking and wearing shoes.  Patient has no history of infection or drainage from both feet.  Patient is unable to  self treat his own nails . This patient presents  to the office today for treatment of the  long nails and a foot evaluation due to history of  Diabetes. Patient was newly diagnosed as diabetic.  General Appearance  Alert, conversant and in no acute stress.  Vascular  Dorsalis pedis and posterior tibial  pulses are palpable  bilaterally.  Capillary return is within normal limits  bilaterally. Temperature is within normal limits  bilaterally.  Neurologic  Senn-Weinstein monofilament wire test within normal limits  bilaterally. Muscle power within normal limits bilaterally.  Nails Thick disfigured discolored nails with subungual debris  from hallux to fifth toes bilaterally. No evidence of bacterial infection or drainage bilaterally.  Orthopedic  No limitations of motion of motion feet .  No crepitus or effusions noted.  No bony pathology or digital deformities noted.  Skin  normotropic skin with no porokeratosis noted bilaterally.  No signs of infections or ulcers noted.     Onychomycosis  Diabetes with no foot complications  IE  Debride nails x 2.  A diabetic foot exam was performed and there is no evidence of any vascular or neurologic pathology.   RTC  1 year.   Gardiner Barefoot DPM

## 2019-09-21 DIAGNOSIS — M546 Pain in thoracic spine: Secondary | ICD-10-CM | POA: Insufficient documentation

## 2019-09-25 ENCOUNTER — Ambulatory Visit: Payer: Medicare Other | Attending: Family Medicine | Admitting: Pharmacist

## 2019-09-25 ENCOUNTER — Other Ambulatory Visit: Payer: Self-pay

## 2019-09-25 ENCOUNTER — Telehealth: Payer: Self-pay

## 2019-09-25 DIAGNOSIS — E119 Type 2 diabetes mellitus without complications: Secondary | ICD-10-CM | POA: Diagnosis not present

## 2019-09-25 LAB — GLUCOSE, POCT (MANUAL RESULT ENTRY): POC Glucose: 131 mg/dl — AB (ref 70–99)

## 2019-09-25 NOTE — Telephone Encounter (Signed)
Clinical pharmacist to review eliquis. Patient had TEE DCCV on 9/23, he is more than 4 weeks out from cardioversion.   He has followup appt with Cameron Arroyo on 10/03/2019, callback pool to update followup schedule comment to include preop clearance.

## 2019-09-25 NOTE — Telephone Encounter (Signed)
    [facility] Pre-operative Risk Assessment    Request for surgical clearance:  1. What type of surgery is being performed? Lumbar Fusion   2. When is this surgery scheduled? TBD  3. What type of clearance is required (medical clearance vs. Pharmacy clearance to hold med vs. Both)? Both  4. Are there any medications that need to be held prior to surgery and how long? Eliquis  5. Practice name and name of physician performing surgery? [facility] & Dr Jovita Gamma  6. What is your office phone number? [phone]   7.   What is your office fax number? [phone]  8.   Anesthesia type (None, local, MAC, general) ? General   Dayne Dekay 09/25/2019, 4:23 PM  _________________________________________________________________   (provider comments below)

## 2019-09-25 NOTE — Progress Notes (Signed)
    S:    PCP: Dr. Joya Gaskins  No chief complaint on file.  Patient arrives in good spirits accompanied by his wife.  Presents for diabetes evaluation, education, and management. Patient was referred and last seen by Primary Care Provider on 09/12/19.    Patient reports Diabetes was diagnosed in 07/2019.   Family/Social History:  - FHx: no positive DM history in family per [facility] - Tobacco: former smoker (quit in 1994)  - Alcohol: denies use  Insurance coverage/medication affordability: Medicare, [employer]   Patient reports adherence with medications.  Current diabetes medications include: metformin 1000 mg BID, Tresiba 15 units  Current hypertension medications include: metoprolol 12.5 mg BID Current hyperlipidemia medications include: atorvastatin 10 mg daily   Patient denies hypoglycemic events.  Patient reported dietary habits: - Patient reports compliance with diet  Patient-reported exercise habits:  - Limited d/t back pain   Patient denies nocturia.  Patient denies neuropathy. Patient denies visual changes. Patient denies self foot exams.    O:  131  Lab Results  Component Value Date   HGBA1C 11.5 (H) 08/20/2019   There were no vitals filed for this visit.  Lipid Panel     Component Value Date/Time   CHOL 168 09/11/2019 1145   TRIG 153 (H) 09/11/2019 1145   HDL 41 09/11/2019 1145   CHOLHDL 4.1 09/11/2019 1145   LDLCALC 100 (H) 09/11/2019 1145    Home fasting CBG: 111 - 141  2 hour post-prandial/random CBG: 98 - 162.   Clinical ASCVD: No  The 10-year ASCVD risk score Mikey Bussing DC Jr., et al., 2013) is: 20%   Values used to calculate the score:     Age: 68 years     Sex: Male     Is Non-Hispanic African American: No     Diabetic: Yes     Tobacco smoker: No     Systolic Blood Pressure: [id number] mmHg     Is BP treated: No     HDL Cholesterol: 41 mg/dL     Total Cholesterol: 168 mg/dL    A/P: Diabetes newly dx currently uncontrolled but home sugars reveal good  improvement. Patient is able to verbalize appropriate hypoglycemia management plan. Patient is adherent with medication.  -Continue current regimen.  -Extensively discussed pathophysiology of DM, recommended lifestyle interventions, dietary effects on glycemic control -Counseled on s/sx of and management of hypoglycemia -Next A1C anticipated 10/2019.   ASCVD risk - primary prevention in patient with DM. Last LDL 100. controlled. ASCVD risk score is 20% - I recommend high intensity statin based on this. He is currently on moderate intensity.  -Continued atorvastatin 10 mg for now.  -Will defer to Dr. Joya Gaskins for dose increase next week if amenable.    HM: UTD on tetanus vaccine. Recently deferred flu. Pt is 68 YO and has not received pneumococcal vaccination. Will defer to Dr. Joya Gaskins. Prevnar may be administered in this setting.   Written patient instructions provided.  Total time in face to face counseling 30 minutes.   Follow up PCP Clinic Visit in 1 week.  Benard Halsted, PharmD, [place] [phone]

## 2019-09-25 NOTE — Patient Instructions (Signed)
Thank you for coming to see me today. Please do the following:  1. Continue all medications.  2. Continue checking blood sugars at home.  3. Continue making the lifestyle changes we've discussed together during our visit. Diet and exercise play a significant role in improving your blood sugars.  4. Follow-up with Dr. Joya Gaskins next week.    Hypoglycemia or low blood sugar:   Low blood sugar can happen quickly and may become an emergency if not treated right away.   While this shouldn't happen often, it can be brought upon if you skip a meal or do not eat enough. Also, if your insulin or other diabetes medications are dosed too high, this can cause your blood sugar to go to low.   Warning signs of low blood sugar include: 1. Feeling shaky or dizzy 2. Feeling weak or tired  3. Excessive hunger 4. Feeling anxious or upset  5. Sweating even when you aren't exercising  What to do if I experience low blood sugar? 1. Check your blood sugar with your meter. If lower than 70, proceed to step 2.  2. Treat with 3-4 glucose tablets or 3 packets of regular sugar. If these aren't around, you can try hard candy. Yet another option would be to drink 4 ounces of fruit juice or 6 ounces of REGULAR soda.  3. Re-check your sugar in 15 minutes. If it is still below 70, do what you did in step 2 again. If has come back up, go ahead and eat a snack or small meal at this time.

## 2019-09-26 ENCOUNTER — Encounter: Payer: Self-pay | Admitting: Pharmacist

## 2019-09-26 NOTE — Telephone Encounter (Signed)
Will route note to L. Kilroy, PA-C who will see patient on 10/03/2019 for surgical clearance. Note will be removed from the preop pool. Richardson Dopp, PA-C    09/26/2019 10:33 AM

## 2019-09-26 NOTE — Telephone Encounter (Signed)
Patient with diagnosis of atrial fibrillation on Eliquis for anticoagulation.    Procedure: lumbar fusion Date of procedure: TBD  CHADS2-VASc score of  2 (AGE, DM2)  CrCl 98.7 Platelet count 115  Per office protocol, patient can hold Eliquis for 3 days prior to procedure.  (has been > 30 days since cardioversion)  Patient will not need bridging with Lovenox (enoxaparin) around procedure.

## 2019-10-02 ENCOUNTER — Ambulatory Visit: Payer: Medicare Other | Attending: Critical Care Medicine | Admitting: Critical Care Medicine

## 2019-10-02 ENCOUNTER — Encounter: Payer: Self-pay | Admitting: Critical Care Medicine

## 2019-10-02 ENCOUNTER — Other Ambulatory Visit: Payer: Self-pay

## 2019-10-02 VITALS — BP 111/71 | HR 75 | Temp 98.3°F | Ht 72.0 in | Wt 192.6 lb

## 2019-10-02 DIAGNOSIS — M79674 Pain in right toe(s): Secondary | ICD-10-CM

## 2019-10-02 DIAGNOSIS — M4716 Other spondylosis with myelopathy, lumbar region: Secondary | ICD-10-CM | POA: Diagnosis not present

## 2019-10-02 DIAGNOSIS — E1169 Type 2 diabetes mellitus with other specified complication: Secondary | ICD-10-CM

## 2019-10-02 DIAGNOSIS — I4892 Unspecified atrial flutter: Secondary | ICD-10-CM | POA: Diagnosis not present

## 2019-10-02 DIAGNOSIS — B351 Tinea unguium: Secondary | ICD-10-CM

## 2019-10-02 DIAGNOSIS — E785 Hyperlipidemia, unspecified: Secondary | ICD-10-CM

## 2019-10-02 DIAGNOSIS — E1165 Type 2 diabetes mellitus with hyperglycemia: Secondary | ICD-10-CM | POA: Diagnosis not present

## 2019-10-02 DIAGNOSIS — Z794 Long term (current) use of insulin: Secondary | ICD-10-CM

## 2019-10-02 DIAGNOSIS — M79675 Pain in left toe(s): Secondary | ICD-10-CM

## 2019-10-02 NOTE — Assessment & Plan Note (Signed)
Follow-up magnesium level is normal

## 2019-10-02 NOTE — Assessment & Plan Note (Signed)
Significant lumbar spondylosis with myelopathy and the patient is encouraged to follow-up with neurosurgery and discussed with cardiology whether he can come off Eliquis to have surgical procedure

## 2019-10-02 NOTE — Assessment & Plan Note (Signed)
Elevated LDL at 100 would like to see a goal less then 100 potentially down to 70 however the patient is declining obtaining the atorvastatin for now and wants to talk this over the cardiology first

## 2019-10-02 NOTE — Assessment & Plan Note (Signed)
Improvement in foot pain status post toenail debridement

## 2019-10-02 NOTE — Assessment & Plan Note (Signed)
History of atrial flutter with rapid ventricular response now in his rhythm at this time  Plan will be to continue the metoprolol and Eliquis and have the patient follow-up with cardiology on the duration of need for the Eliquis

## 2019-10-02 NOTE — Patient Instructions (Signed)
Stay on the Tresbia and metformin as you are currently taking it  Discuss the atorvastatin dosing with your cardiologist tomorrow and the need to be on this medication  Discussed with the cardiologist the ability to come off Eliquis for lumbar surgery procedure  No medication changes for now  We will use [facility] for your refills of all your medicines for now  Return in follow-up 6 weeks

## 2019-10-02 NOTE — Assessment & Plan Note (Addendum)
Type 2 diabetes with him much improved control  We will continue tresiba at 15 units daily and metformin at 1000 mg twice daily with the goal of eventually coming down on the dose of the tresiba and even consider potential referral to an endocrinology

## 2019-10-02 NOTE — Progress Notes (Signed)
Subjective:    Patient ID: Cameron Arroyo, male    DOB: [birth year], 68 y.o.   MRN: [id number]  68 y.o.M post hosp f/u and to establish PCP When I entered the room this patient was extremely angry and I had to spend at least 10 minutes of the visit initially de-escalating the patient's anger.  The patient was accompanied by his spouse who also was very frustrated.  Their statement is that when the patient was admitted from the 21st and 24 September due to the COVID restrictions the spouse could not visit and the patient felt that he was getting conflicting information between various providers who saw him during his short visit.  Also the patient was to receive a post hospital visit with cardiology but that appointment did not appear to be made as of yet. The patient had a former primary care provider at the [facility] site however is wishing to establish care.  Has not been seen by primary care provider in 6 years.  During the hospitalization the patient was found to have new onset diabetes with hemoglobin A1c of 11.5.  He was to go home on Lantus but his insurance would not cover this so he switched to NPH 15 units in the morning and 5 units in the evening.  He is also on 500 mg once daily of metformin.  The patient brings a very complete record of all of his weights blood pressures heart rate and blood sugars.  His blood sugars appear to range anywhere from a low of 1 10-1 20 to a high of 1 70-1 80.  His morning sugars tend to run lower his afternoon sugars tend to run higher.  The patient's weight has gone down he did suffer significant weight loss in the past.  He is 189 pounds today and note his CBG today is 172  The patient did have polyuria and polydipsia this is improved somewhat but is still occurring.  The patient was admitted he had atrial fibrillation atrial flutter with rapid ventricular response and was hypotensive secondary to hypovolemia with associated hypermagnesemia  As the  patient's electrolytes and fluids were replaced and replenished the patient improved.  Once again he was sent home on the NPH and metformin as noted above.  The patient was also cardioverted during this hospitalization and placed on low-dose metoprolol along with Eliquis twice daily  Below is a copy of the discharge summary  Patient: Cameron Arroyo                                                         Admit date: 08/20/2019   DOB: [birth year]                                                              Discharge date:08/23/2019/11:20 AM [id number]  Discharge Diagnoses:   Principal Problem:   New onset type 2 diabetes mellitus ([place]) Active Problems:   Atrial flutter with rapid ventricular response (HCC)   Leukocytosis   Unintentional weight loss   Polyuria   Hypotension due to hypovolemia   Diabetes mellitus due to underlying condition, uncontrolled, with hyperglycemia ([place])   History of Present Illness/ Hospital Course [name] Summary: Cameron Jenkins Jonesis a 68 y.o.malewithno significant past medical history came to the emergency department with multiple problems. He reports that he feels like crap, polyuria, dry mouth, decreased appetite, weight loss, shortness of breath, anxiety, no energy since 2 to 3 weeks. He went to [facility] health urgent care initially where he was found to have atrial flutter with rapid response and was advised to go to the emergency department for further evaluation and management. He reports shortness of breath but denies association with chest pain, palpitation, headache, blurry vision, lightheadedness, dizziness, nausea, vomiting, epigastric pain, diarrhea, constipation, fever, chills, decreased appetite, night sweats, family history of thyroid issues, colon cancer, hematemesis, melena,  over-the-counter use of NSAIDs. Reports unintentional weight loss of 30 pounds in the last 1 year. He is a former smoker however denies alcohol, illicit drug use. He was prescribed steroids by his PCP recently for left knee pain. Patient reports that his left knee pain is better.   ED Course:Patient was found to have rapid narrow complex tachycardia suggestive of atrial flutter.Hyperglycemia of blood glucose 430, and leukocytosis of 12.7. Initial troponin x1-. Magnesium level elevated at 2.5. Chest x-ray came back negative. COVID-19: Pending. CBC: H&H is stable. Kidney function: WNL.[name] Kitchen Started on IV diltiazem and IV heparin.Transthoracic echo was ordered which was not performed due to patient's tachycardia.  Briefly patient was admitted with atrial flutter, status post TEE cardioversion which was successful on 08/22/2019 currently in normal sinus rhythm.  Per cardiology patient started on metoprolol and Eliquis.  Patient was noted from new onset diabetes mellitus with a hemoglobin A1c of 11.5.  Patient started on Lantus and metformin.  He was educated on diabetes, diet, instructed to check his blood sugars 3 times daily-4 times daily, keep a log for adjustment and titration of the medication for better glycemic control.  Patient was deemed stable was discharged home.   Discharge summary/Assessment & Plan:   Hyperglycemia/ newly diagnosed diabetesmellitus, POA: -Blood sugar more than 400 upon arrival -A1C 11.5 -Patient presented with polyuria, dry mouth, weight loss, fatigue -will start Lantus 20u / day and SSI/started on metformin 500 mg -Patient was given glucometer, instructed to check blood sugars 3 times daily-4 times daily, keep close log... Follow with PCP with adjustment of medication accordingly.  -Status post volume repletion -consult diabetes team for education; dietary following  Atrial flutter, asymptomatic, POA: -Patientasymptomatic regarding  arrhythmia -Patient heart rates more than 150s - trop neg and CXR negative - Thyroid function studyWNL - seen by cardiology, on IV heparin and IV diltiazem now- pendingTEE/cardioversionOn  08/22/19 -Cardioversion was successful, patient is currently in normal sinus rhythm -Per cardiology patient was started on metoprolol and Eliquis prescription were provided  Leukocytosis,reactive, resolved: -Likely reactive given recent steroid use, A. fib/flutter and hyperglycemia -Resolved no signs of infection.  Unintentional weight loss: -Reported loss of30 pounds in 1 year. -Likely in the setting of poorly controlled diabetes with A1c of 11.5 -Patient was instructed to keep an close log of his weight changes.   Code Status:Partial, DO NOT INTUBATE Family Communication:None present Disposition Plan: Patient will be discharged home, with close follow with  PCP and cardiologist in 1 to 2 weeks.  Consultants:  Cardiology  Procedures:  Status post tentative cardioversion on 08/22/2019 currently on normal sinus rhythm    Note the patient has also recently been seen by sports medicine physician in the [facility] here in [place] who is referring him to Dr. Sherwood Gambler of neurosurgery along with an MRI because of severe low back pain.  Note the note the neurosurgeon had done cervical spine surgery in the past.  Below is the office note and plan from that visit Low back pain lumbar issue saw sports medicine 09/11/19: Assessment Note I had a long discussion with the patient. He is quite miserable today. I do believe this is all emanating from his lumbar spine. He is now greater than 6 weeks into conservative care and not improving at all. The prednisone did not help and he reports may have made him worse. I will place him on to gabapentin and titrate up slowly as tolerated. He will monitor for sedation. We will obtain an MRI scan of his lumbar spine. He would like to be  referred to Dr. Sherwood Gambler for further treatment. I will go and see if I can make the arrangements for him. He is quite frustrated with his pain and would like to see all of his options. All questions were encouraged and answered.   1. Low back pain  XR, lumbar spine   MRI, lumbar spine, w/o contrast - mri lumbar spine r/o hnp   magnetic resonance imaging (MRI): about this test  G-MRI Self Disclosure Notice  neurosurgery referral  2. Pain in left knee 3. Lumbar spondylosis with myelopathy  gabapentin 300 mg capsule    The patient also shared with me his diet plan and all of his meals are carefully written down by the spouse.  Note that he is eating a lot of potatoes and grits with his meals.  Note also the patient's PHQ 9 is extremely high during this visit and it is clear that he is experiencing some degree of frustration with healthcare system and has been experiencing quite a bit of anxiety and depression.   10/02/2019 This patient returns in follow-up for diabetes, atrial flutter, hyperlipidemia, lumbar iSpondylosis with myelopathy. At the last visit we obtained a lipid panel showing an LDL of 100 and elected to begin atorvastatin 10 mg daily.  The patient is yet to pick this up because when he was on a lipid medication 7 years ago he paid $3 a pill.  I explained to the patient this is now generic medicine and should not of been that expensive.  He wants to discuss the need for this with his cardiologist and his appointment which she has tomorrow morning.  The patient states he has been doing better with his blood glucoses and displays a very nice log.  His blood sugars run typically fasting 100-120 and postprandial and late in the afternoon will run as high as 170 but more recently with increases in his tresbia have been running closer to the 120-130 range The patient did decline at this visit a flu vaccine and pneumonia vaccine  Patient did go to podiatry and had his toenails  debrided and had a normal foot exam  The patient is seeing physical therapy for his chronic low back pain and as well has upcoming appointments with neurosurgery  The patient's question is can he come off the Eliquis should he require a surgical procedure on his lumbar spine I asked him  to discuss that with his cardiologist       Past Medical History:  Diagnosis Date   Asthma    as child   Atrial flutter with rapid ventricular response ([place]) 08/20/2019   New onset type 2 diabetes mellitus ([place]) 08/20/2019     Family History  Problem Relation Age of Onset   Cirrhosis Mother    Cancer Father      Social History   Socioeconomic History   Marital status: Married    Spouse name: Not on file   Number of children: Not on file   Years of education: Not on file   Highest education level: Not on file  Occupational History   Not on file  Social Needs   Financial resource strain: Not on file   Food insecurity    Worry: Not on file    Inability: Not on file   Transportation needs    Medical: Not on file    Non-medical: Not on file  Tobacco Use   Smoking status: Former Smoker    Quit date: 11/29/1992    Years since quitting: 26.8   Smokeless tobacco: Never Used  Substance and Sexual Activity   Alcohol use: No   Drug use: Never   Sexual activity: Not on file  Lifestyle   Physical activity    Days per week: Not on file    Minutes per session: Not on file   Stress: Not on file  Relationships   Social connections    Talks on phone: Not on file    Gets together: Not on file    Attends religious service: Not on file    Active member of club or organization: Not on file    Attends meetings of clubs or organizations: Not on file    Relationship status: Not on file   Intimate partner violence    Fear of current or ex partner: Not on file    Emotionally abused: Not on file    Physically abused: Not on file    Forced sexual activity: Not on file  Other  Topics Concern   Not on file  Social History Narrative   Not on file     No Known Allergies   Outpatient Medications Prior to Visit  Medication Sig Dispense Refill   apixaban (ELIQUIS) 5 MG TABS tablet Take 1 tablet (5 mg total) by mouth 2 (two) times daily. 180 tablet 1   BD PEN NEEDLE NANO U/F 32G X 4 MM MISC See admin instructions. use with insulin pen     gabapentin (NEURONTIN) 300 MG capsule gabapentin 300 mg capsule  Take 1 capsule 3 times a day by oral route.     insulin degludec (TRESIBA FLEXTOUCH) 100 UNIT/ML SOPN FlexTouch Pen Inject 0.15 mLs (15 Units total) into the skin at bedtime. 15 mL 0   metFORMIN (GLUCOPHAGE) 1000 MG tablet Take 1 tablet (1,000 mg total) by mouth 2 (two) times daily with a meal. 60 tablet 3   metoprolol tartrate (LOPRESSOR) 25 MG tablet Take 0.5 tablets (12.5 mg total) by mouth 2 (two) times daily. 90 tablet 0   atorvastatin (LIPITOR) 10 MG tablet Take 1 tablet (10 mg total) by mouth daily. (Patient not taking: Reported on 10/02/2019) 90 tablet 0   No facility-administered medications prior to visit.       Review of Systems  Pos in BOLD Constitutional:     weight loss, night sweats,  Fevers, chills, fatigue, lassitude. HEENT:   No headaches,  Difficulty swallowing,  Tooth/dental problems,  Sore throat,                No sneezing, itching, ear ache, nasal congestion, post nasal drip,   CV:  No chest pain,  Orthopnea, PND, swelling in lower extremities, anasarca, dizziness, palpitations  GI  No heartburn, indigestion, abdominal pain, nausea, vomiting, diarrhea, change in bowel habits, loss of appetite  Resp: No shortness of breath with exertion or at rest.  No excess mucus, no productive cough,  No non-productive cough,  No coughing up of blood.  No change in color of mucus.  No wheezing.  No chest wall deformity  Skin: no rash or lesions.  GU: no dysuria, change in color of urine, no urgency or frequency.  No flank pain. polyuria  MS:   No joint pain or swelling.  No decreased range of motion.   back pain.  Psych:   change in mood or affect.  depression or anxiety.  No memory loss.     Objective:   Physical Exam Vitals:   10/02/19 1428  BP: 111/71  Pulse: 75  Temp: 98.3 F (36.8 C)  TempSrc: Oral  SpO2: 95%  Weight: 192 lb 9.6 oz (87.4 kg)  Height: 6' (1.829 m)    Wt Readings from Last 3 Encounters:  10/02/19 192 lb 9.6 oz (87.4 kg)  09/11/19 189 lb (85.7 kg)  08/23/19 191 lb 11.2 oz (87 kg)    Gen: Patient remains very angry and frustrated and tends to lash out in the interview at times, thin , low muscle mass,  in no distress,    ENT: No lesions,  mouth clear,  oropharynx clear, no postnasal drip Severe periodontal disease and carious teeth  Neck: No JVD, no TMG, no carotid bruits  Lungs: No use of accessory muscles, no dullness to percussion, clear without rales or rhonchi  Cardiovascular: RRR, heart sounds normal, no murmur or gallops, no peripheral edema  Abdomen: soft and NT, no HSM,  BS normal  Musculoskeletal: No deformities, no cyanosis or clubbing  Neuro: alert, non focal  Skin: Warm, no lesions or rashes  ECG 09/11/19:   Sinus bradycardia,  PACs otherwise normal  CXR 9/21: NAD  TEE: FINDINGS:  LEFT VENTRICLE: EF = 50-55%  RIGHT VENTRICLE: Normal size and function.   LEFT ATRIUM: No thrombus/mass.  LEFT ATRIAL APPENDAGE: No thrombus/mass.   RIGHT ATRIUM: No thrombus/mass.  AORTIC VALVE:  Trileaflet. No regurgitation.   MITRAL VALVE:    Normal structure. Mild regurgitation.   TRICUSPID VALVE: Normal structure. No regurgitation.   PULMONIC VALVE: Grossly normal structure. No regurgitation. No apparent vegetation.  INTERATRIAL SEPTUM: No PFO or ASD seen by color Doppler.  PERICARDIUM: No effusion noted.  DESCENDING AORTA: Mild plaque    CARDIOVERSION:     Indications:  Symptomatic Atrial Flutter  Procedure Details:  Once the TEE was complete,  the patient had the defibrillator pads placed in the anterior and posterior position. Once an appropriate level of sedation was confirmed, the patient was cardioverted x 1 with 100J of biphasic synchronized energy.  The patient converted to NSR.  There were no apparent complications.  The patient had normal neuro status and respiratory status post procedure with vitals stable as recorded elsewhere.  Adequate airway was maintained throughout and vital signs monitored per protocol.  BMP Latest Ref Rng & Units 09/11/2019 08/23/2019 08/22/2019  Glucose 65 - 99 mg/dL 144(H) 143(H) 155(H)  BUN 8 - 27 mg/dL 14 17 18  Creatinine 0.76 - 1.27 mg/dL 0.88 0.86 0.76  BUN/Creat Ratio 10 - 24 16 - -  Sodium 134 - 144 mmol/L 141 137 137  Potassium 3.5 - 5.2 mmol/L 4.2 3.6 3.5  Chloride 96 - 106 mmol/L 106 106 107  CO2 20 - 29 mmol/L _0 Calcium 8.6 - 10.2 mg/dL 9.2 8.4(L) 7.8(L)   Hepatic Function Latest Ref Rng & Units 09/11/2019 08/21/2019  Total Protein 6.0 - 8.5 g/dL 6.1 5.2(L)  Albumin 3.8 - 4.8 g/dL 4.4 3.0(L)  AST 0 - 40 IU/L 17 15  ALT 0 - 44 IU/L 26 21  Alk Phosphatase 39 - 117 IU/L 65 66  Total Bilirubin 0.0 - 1.2 mg/dL 0.4 1.3(H)   CBC Latest Ref Rng & Units 08/23/2019 08/22/2019 08/21/2019  WBC 4.0 - 10.5 K/uL 8.3 7.3 9.3  Hemoglobin 13.0 - 17.0 g/dL 13.4 13.3 14.0  Hematocrit 39.0 - 52.0 % 37.7(L) 39.1 41.4  Platelets 150 - 400 K/uL 115(L) 131(L) 146(L)   Lipid Panel     Component Value Date/Time   CHOL 168 09/11/2019 1145   TRIG 153 (H) 09/11/2019 1145   HDL 41 09/11/2019 1145   CHOLHDL 4.1 09/11/2019 1145   LDLCALC 100 (H) 09/11/2019 1145   LABVLDL 27 09/11/2019 1145    Hep C neg Microalbumin Normal      Assessment & Plan:  I personally reviewed all images and lab data in the [facility] system as well as any outside material available during this office visit and agree with the  radiology impressions.   Atrial flutter with rapid ventricular response (HCC) History of atrial  flutter with rapid ventricular response now in his rhythm at this time  Plan will be to continue the metoprolol and Eliquis and have the patient follow-up with cardiology on the duration of need for the Eliquis  Controlled type 2 diabetes mellitus ([place]) Type 2 diabetes with him much improved control  We will continue tresiba at 15 units daily and metformin at 1000 mg twice daily with the goal of eventually coming down on the dose of the tresiba and even consider potential referral to an endocrinology  Hyperlipidemia associated with type 2 diabetes mellitus ([place]) Elevated LDL at 100 would like to see a goal less then 100 potentially down to 70 however the patient is declining obtaining the atorvastatin for now and wants to talk this over the cardiology first  Lumbar spondylosis with myelopathy Significant lumbar spondylosis with myelopathy and the patient is encouraged to follow-up with neurosurgery and discussed with cardiology whether he can come off Eliquis to have surgical procedure  Pain due to onychomycosis of toenails of both feet Improvement in foot pain status post toenail debridement  Hypermagnesemia Follow-up magnesium level is normal   Diagnoses and all orders for this visit:  Controlled type 2 diabetes mellitus with hyperglycemia, with long-term current use of insulin (HCC)  Atrial flutter with rapid ventricular response ([place])  Lumbar spondylosis with myelopathy  Hyperlipidemia associated with type 2 diabetes mellitus ([place])  Pain due to onychomycosis of toenails of both feet  Hypermagnesemia

## 2019-10-03 ENCOUNTER — Encounter: Payer: Self-pay | Admitting: Cardiology

## 2019-10-03 ENCOUNTER — Ambulatory Visit (INDEPENDENT_AMBULATORY_CARE_PROVIDER_SITE_OTHER): Payer: 59 | Admitting: Cardiology

## 2019-10-03 DIAGNOSIS — I480 Paroxysmal atrial fibrillation: Secondary | ICD-10-CM

## 2019-10-03 DIAGNOSIS — Z7901 Long term (current) use of anticoagulants: Secondary | ICD-10-CM | POA: Insufficient documentation

## 2019-10-03 DIAGNOSIS — Z01818 Encounter for other preprocedural examination: Secondary | ICD-10-CM | POA: Diagnosis not present

## 2019-10-03 NOTE — Assessment & Plan Note (Signed)
On Glucophage 

## 2019-10-03 NOTE — Assessment & Plan Note (Signed)
Atrial flutter- s/p TEE CV 08/20/2019

## 2019-10-03 NOTE — Assessment & Plan Note (Signed)
Pt seen today for pre op clearance for back surgery

## 2019-10-03 NOTE — Patient Instructions (Signed)
Medication Instructions:  Your physician recommends that you continue on your current medications as directed. Please refer to the Current Medication list given to you today. *If you need a refill on your cardiac medications before your next appointment, please call your pharmacy*  Lab Work: None  If you have labs (blood work) drawn today and your tests are completely normal, you will receive your results only by: [name] Kitchen MyChart Message (if you have MyChart) OR . A paper copy in the mail If you have any lab test that is abnormal or we need to change your treatment, we will call you to review the results.  Testing/Procedures: None   Follow-Up: At [facility], you and your health needs are our priority.  As part of our continuing mission to provide you with exceptional heart care, we have created designated Provider Care Teams.  These Care Teams include your primary Cardiologist (physician) and Advanced Practice Providers (APPs -  Physician Assistants and Nurse Practitioners) who all work together to provide you with the care you need, when you need it.  Your next appointment:   1 MONTH   The format for your next appointment:   In Person  Provider:   K. Mali Hilty, MD  Other Instructions [place]

## 2019-10-03 NOTE — Telephone Encounter (Signed)
   Primary Cardiologist: Pixie Casino, MD  Chart reviewed and patient seen in the office today as part of pre-operative protocol coverage. Given past medical history and time since last visit, based on ACC/AHA guidelines, Cameron Arroyo would be at acceptable risk for the planned procedure without further cardiovascular testing.   Per our pharmacy protocol OK to hold Eliquis 3 days pre op.   I will route this recommendation to the requesting party via Epic fax function and remove from pre-op pool.  Please call with questions.  Kerin Ransom, PA-C 10/03/2019, 10:15 AM

## 2019-10-03 NOTE — Progress Notes (Signed)
Cardiology Office Note:    Date:  10/03/2019   ID:  Cameron Arroyo, DOB [birth year], MRN [id number]  PCP:  Elsie Stain, MD  Cardiologist:  Pixie Casino, MD  Electrophysiologist:  None   Referring MD: Elsie Stain, MD   No chief complaint on file.   History of Present Illness:    Cameron Arroyo is a 68 y.o. male with a hx of PAF/ a-flutter s/p TEE CV 08/20/2019.  He is in the office today for pre op clearance for back surgery.  He has no history of CAD or chest pain.  Echo done 08/22/2019 showed normal LVF.  He was seen today with his wife on speaker phone.    Past Medical History:  Diagnosis Date  . Asthma    as child  . Atrial flutter with rapid ventricular response ([place]) 08/20/2019  . New onset type 2 diabetes mellitus ([place]) 08/20/2019    Past Surgical History:  Procedure Laterality Date  . ANTERIOR CERVICAL DECOMP/DISCECTOMY FUSION N/A 03/01/2013   Procedure: Cervical Four-Five Cervical Five-Six Cervical Six-Seven anterior cervical decompression with fusion plating and bonegraft;  Surgeon: Hosie Spangle, MD;  Location: [facility];  Service: Neurosurgery;  Laterality: N/A;  ANTERIOR CERVICAL DECOMPRESSION/DISCECTOMY FUSION 3 LEVELS  . CARDIOVERSION N/A 08/22/2019   Procedure: CARDIOVERSION;  Surgeon: Donato Heinz, MD;  Location: [facility] ENDOSCOPY;  Service: Endoscopy;  Laterality: N/A;  . TEE WITHOUT CARDIOVERSION N/A 08/22/2019   Procedure: TRANSESOPHAGEAL ECHOCARDIOGRAM (TEE);  Surgeon: Donato Heinz, MD;  Location: [facility] ENDOSCOPY;  Service: Endoscopy;  Laterality: N/A;    Current Medications: Current Meds  Medication Sig  . apixaban (ELIQUIS) 5 MG TABS tablet Take 1 tablet (5 mg total) by mouth 2 (two) times daily.  [name] Kitchen gabapentin (NEURONTIN) 300 MG capsule gabapentin 300 mg capsule  Take 1 capsule 3 times a day by oral route.  . insulin degludec (TRESIBA FLEXTOUCH) 100 UNIT/ML SOPN FlexTouch Pen Inject 0.15 mLs (15 Units total) into the skin at  bedtime.  . metFORMIN (GLUCOPHAGE) 1000 MG tablet Take 1 tablet (1,000 mg total) by mouth 2 (two) times daily with a meal.  . metoprolol tartrate (LOPRESSOR) 25 MG tablet Take 0.5 tablets (12.5 mg total) by mouth 2 (two) times daily.     Allergies:   Patient has no known allergies.   Social History   Socioeconomic History  . Marital status: Married    Spouse name: Not on file  . Number of children: Not on file  . Years of education: Not on file  . Highest education level: Not on file  Occupational History  . Not on file  Social Needs  . Financial resource strain: Not on file  . Food insecurity    Worry: Not on file    Inability: Not on file  . Transportation needs    Medical: Not on file    Non-medical: Not on file  Tobacco Use  . Smoking status: Former Smoker    Quit date: 11/29/1992    Years since quitting: 26.8  . Smokeless tobacco: Never Used  Substance and Sexual Activity  . Alcohol use: No  . Drug use: Never  . Sexual activity: Not on file  Lifestyle  . Physical activity    Days per week: Not on file    Minutes per session: Not on file  . Stress: Not on file  Relationships  . Social Herbalist on phone: Not on file    Gets  together: Not on file    Attends religious service: Not on file    Active member of club or organization: Not on file    Attends meetings of clubs or organizations: Not on file    Relationship status: Not on file  Other Topics Concern  . Not on file  Social History Narrative  . Not on file     Family History: The patient's family history includes Cancer in his father; Cirrhosis in his mother.  ROS:   Please see the history of present illness.     All other systems reviewed and are negative.  EKGs/Labs/Other Studies Reviewed:    The following studies were reviewed today: Echo 9/23/202  EKG:  EKG is ordered today.  The ekg ordered today demonstrates NSR-HR 57  Recent Labs: 08/20/2019: TSH 1.194 08/23/2019: Hemoglobin  13.4; Platelets 115 09/11/2019: ALT 26; BUN 14; Creatinine, Ser 0.88; Magnesium 2.2; Potassium 4.2; Sodium 141  Recent Lipid Panel    Component Value Date/Time   CHOL 168 09/11/2019 1145   TRIG 153 (H) 09/11/2019 1145   HDL 41 09/11/2019 1145   CHOLHDL 4.1 09/11/2019 1145   LDLCALC 100 (H) 09/11/2019 1145    Physical Exam:    VS:  BP 114/60   Pulse (!) 57   Ht 6' (1.829 m)   Wt 192 lb 12.8 oz (87.5 kg)   BMI 26.15 kg/m     Wt Readings from Last 3 Encounters:  10/03/19 192 lb 12.8 oz (87.5 kg)  10/02/19 192 lb 9.6 oz (87.4 kg)  09/11/19 189 lb (85.7 kg)     GEN:  Well nourished, well developed in no acute distress HEENT: Normal NECK: No JVD; No carotid bruits LYMPHATICS: No lymphadenopathy CARDIAC: RRR, no murmurs, rubs, gallops RESPIRATORY:  Clear to auscultation without rales, wheezing or rhonchi  ABDOMEN: Soft, non-tender, non-distended MUSCULOSKELETAL:  No edema; No deformity  SKIN: Warm and dry NEUROLOGIC:  Alert and oriented x 3 PSYCHIATRIC:  Normal affect   ASSESSMENT:    PAF (paroxysmal atrial fibrillation) (HCC) Atrial flutter- s/p TEE CV 08/20/2019  Chronic anticoagulation CHADS VASC=2-on Eliquis  Non-insulin treated type 2 diabetes mellitus (HCC) On Glucophage  Pre-operative clearance Pt seen today for pre op clearance for back surgery  PLAN:    From a cardiac standpoint he can have surgery without further cardiac workup.  OK to hold Eliquis 3 days pre op.  The patient and his wife seem to be struggling with his diagnosis.  They say up until a few months ago he was on no medications and now has a diagnosis of PAF and NIDDM.  In addition statin rx has been recommended.  I tried to explain these recommendations to the patient and his wife. I suggested he f/u with Dr Debara Pickett in 6 weeks or so to discuss our recommendations further.    Medication Adjustments/Labs and Tests Ordered: Current medicines are reviewed at length with the patient today.   Concerns regarding medicines are outlined above.  No orders of the defined types were placed in this encounter.  No orders of the defined types were placed in this encounter.   There are no Patient Instructions on file for this visit.   Signed, Kerin Ransom, PA-C  10/03/2019 10:05 AM    [place]

## 2019-10-03 NOTE — Assessment & Plan Note (Signed)
CHADS VASC=2-on Eliquis

## 2019-10-11 ENCOUNTER — Other Ambulatory Visit: Payer: Self-pay | Admitting: Pharmacist

## 2019-10-11 MED ORDER — METFORMIN HCL 1000 MG PO TABS: 1000.0000 mg | ORAL_TABLET | Freq: Two times a day (BID) | ORAL | 0 refills | Status: DC

## 2019-10-17 ENCOUNTER — Other Ambulatory Visit: Payer: Self-pay | Admitting: Neurosurgery

## 2019-10-17 ENCOUNTER — Ambulatory Visit: Payer: No Typology Code available for payment source | Admitting: Dietician

## 2019-10-19 ENCOUNTER — Other Ambulatory Visit: Payer: Self-pay | Admitting: Neurosurgery

## 2019-10-29 NOTE — Pre-Procedure Instructions (Signed)
[id number] [place], [place] [address] [place] [address] Phone: [phone] Fax: [phone]  [facility] of [place], [place] - [address] [place] [address] Phone: [phone] Fax: [place], [place] [address] [place] [address] Phone: [phone] Fax: [phone]      Your procedure is scheduled on 11-01-19  Report to [facility] Main Entrance "A" at 0530 A.M., and check in at the Admitting office.  Call this number if you have problems the morning of surgery:  [phone]  Call [phone] if you have any questions prior to your surgery date Monday-Friday 8am-4pm    Remember:  Do not eat or drink after midnight the night before your surgery  Take these medicines the morning of surgery with A SIP OF WATER : atorvastatin (LIPITOR) gabapentin (NEURONTIN)as needed metoprolol tartrate (LOPRESSOR)  Follow your surgeon's instructions on when to hold ELIQUIS.  If no instructions were given by your surgeon then you will need to call the office to get those instructions.    7 days prior to surgery STOP taking any Aspirin (unless otherwise instructed by your surgeon), Aleve, Naproxen, Ibuprofen, Motrin, Advil, Goody's, BC's, all herbal medications, fish oil, and all vitamins.   WHAT DO I DO ABOUT MY DIABETES MEDICATION?   [name] Kitchen Do not take oral diabetes medicines (pills) including Metformin  the morning of surgery.  . THE NIGHT BEFORE SURGERY, take 7.5U Tresiba      . THE MORNING OF SURGERY, take 7.5 U Tresiba.  . The day of surgery, do not take other diabetes injectables, including Byetta (exenatide), Bydureon (exenatide ER), Victoza (liraglutide), or Trulicity (dulaglutide).  . If your CBG is greater than 220 mg/dL, you may take  of your sliding scale (correction) dose of  insulin.   HOW TO MANAGE YOUR DIABETES BEFORE AND AFTER SURGERY  Why is it important to control my blood sugar before and after surgery? . Improving blood sugar levels before and after surgery helps healing and can limit problems. . A way of improving blood sugar control is eating a healthy diet by: o  Eating less sugar and carbohydrates o  Increasing activity/exercise o  Talking with your doctor about reaching your blood sugar goals . High blood sugars (greater than 180 mg/dL) can raise your risk of infections and slow your recovery, so you will need to focus on controlling your diabetes during the weeks before surgery. . Make sure that the doctor who takes care of your diabetes knows about your planned surgery including the date and location.  How do I manage my blood sugar before surgery? . Check your blood sugar at least 4 times a day, starting 2 days before surgery, to make sure that the level is not too high or low. . Check your blood sugar the morning of your surgery when you wake up and every 2 hours until you get to the Short Stay unit. o If your blood sugar is less than 70 mg/dL, you will need to treat for low blood sugar: - Do not take insulin. - Treat a low blood sugar (less than 70 mg/dL) with  cup of clear juice (cranberry or apple), 4 glucose tablets, OR glucose gel. - Recheck blood sugar in 15 minutes after treatment (to make sure it is greater than 70 mg/dL). If your blood  sugar is not greater than 70 mg/dL on recheck, call [phone] for further instructions. . Report your blood sugar to the short stay nurse when you get to Short Stay.  . If you are admitted to the hospital after surgery: o Your blood sugar will be checked by the staff and you will probably be given insulin after surgery (instead of oral diabetes medicines) to make sure you have good blood sugar levels. o The goal for blood sugar control after surgery is 80-180 mg/dL.         The Morning of  Surgery  Do not wear jewelry, make-up or nail polish.  Do not wear lotions, powders, or perfumes/colognes, or deodorant  Do not shave 48 hours prior to surgery.  Men may shave face and neck.  Do not bring valuables to the hospital.  [facility] is not responsible for any belongings or valuables.  If you are a smoker, DO NOT Smoke 24 hours prior to surgery  If you wear a CPAP at night please bring your mask, tubing, and machine the morning of surgery   Remember that you must have someone to transport you home after your surgery, and remain with you for 24 hours if you are discharged the same day.   Please bring cases for contacts, glasses, hearing aids, dentures or bridgework because it cannot be worn into surgery.    Leave your suitcase in the car.  After surgery it may be brought to your room.  For patients admitted to the hospital, discharge time will be determined by your treatment team.  Patients discharged the day of surgery will not be allowed to drive home.    Special instructions:   [facility]- Preparing For Surgery  Before surgery, you can play an important role. Because skin is not sterile, your skin needs to be as free of germs as possible. You can reduce the number of germs on your skin by washing with CHG (chlorahexidine gluconate) Soap before surgery.  CHG is an antiseptic cleaner which kills germs and bonds with the skin to continue killing germs even after washing.    Oral Hygiene is also important to reduce your risk of infection.  Remember - BRUSH YOUR TEETH THE MORNING OF SURGERY WITH YOUR REGULAR TOOTHPASTE  Please do not use if you have an allergy to CHG or antibacterial soaps. If your skin becomes reddened/irritated stop using the CHG.  Do not shave (including legs and underarms) for at least 48 hours prior to first CHG shower. It is OK to shave your face.  Please follow these instructions carefully.   1. Shower the NIGHT BEFORE SURGERY and the MORNING OF  SURGERY with CHG Soap.   2. If you chose to wash your hair, wash your hair first as usual with your normal shampoo.  3. After you shampoo, rinse your hair and body thoroughly to remove the shampoo.  4. Use CHG as you would any other liquid soap. You can apply CHG directly to the skin and wash gently with a scrungie or a clean washcloth.   5. Apply the CHG Soap to your body ONLY FROM THE NECK DOWN.  Do not use on open wounds or open sores. Avoid contact with your eyes, ears, mouth and genitals (private parts). Wash Face and genitals (private parts)  with your normal soap.   6. Wash thoroughly, paying special attention to the area where your surgery will be performed.  7. Thoroughly rinse your body with warm water from the  neck down.  8. DO NOT shower/wash with your normal soap after using and rinsing off the CHG Soap.  9. Pat yourself dry with a CLEAN TOWEL.  10. Wear CLEAN PAJAMAS to bed the night before surgery, wear comfortable clothes the morning of surgery  11. Place CLEAN SHEETS on your bed the night of your first shower and DO NOT SLEEP WITH PETS.  Day of Surgery:  Please shower the morning of surgery with the CHG soap Do not apply any deodorants/lotions. Please wear clean clothes to the hospital/surgery center.   Remember to brush your teeth WITH YOUR REGULAR TOOTHPASTE.   Please read over the fact sheets that you were given.

## 2019-10-30 ENCOUNTER — Encounter (HOSPITAL_COMMUNITY)
Admission: RE | Admit: 2019-10-30 | Discharge: 2019-10-30 | Disposition: A | Discharge status: Home / Self Care | Payer: 59 | Source: Ambulatory Visit | Attending: Neurosurgery | Admitting: Neurosurgery

## 2019-10-30 ENCOUNTER — Other Ambulatory Visit (HOSPITAL_COMMUNITY)
Admission: RE | Admit: 2019-10-30 | Discharge: 2019-10-30 | Disposition: A | Discharge status: Home / Self Care | Payer: 59 | Source: Ambulatory Visit | Attending: Neurosurgery | Admitting: Neurosurgery

## 2019-10-30 ENCOUNTER — Encounter (HOSPITAL_COMMUNITY): Payer: Self-pay

## 2019-10-30 ENCOUNTER — Other Ambulatory Visit: Payer: Self-pay

## 2019-10-30 DIAGNOSIS — Z01812 Encounter for preprocedural laboratory examination: Secondary | ICD-10-CM | POA: Insufficient documentation

## 2019-10-30 HISTORY — DX: Pneumonia, unspecified organism: J18.9

## 2019-10-30 HISTORY — DX: Unspecified osteoarthritis, unspecified site: M19.90

## 2019-10-30 LAB — BASIC METABOLIC PANEL
Anion gap: 13 (ref 5–15)
BUN: 17 mg/dL (ref 8–23)
CO2: 23 mmol/L (ref 22–32)
Calcium: 9.1 mg/dL (ref 8.9–10.3)
Chloride: 105 mmol/L (ref 98–111)
Creatinine, Ser: 0.83 mg/dL (ref 0.61–1.24)
GFR calc Af Amer: >60 mL/min (ref >60)
GFR calc non Af Amer: >60 mL/min (ref >60)
Glucose, Bld: 132 mg/dL — ABNORMAL HIGH (ref 70–99)
Potassium: 4.1 mmol/L (ref 3.5–5.1)
Sodium: 141 mmol/L (ref 135–145)

## 2019-10-30 LAB — TYPE AND SCREEN
ABO/RH(D): A POS
Antibody Screen: NEGATIVE

## 2019-10-30 LAB — CBC
HCT: 43.9 % (ref 39.0–52.0)
Hemoglobin: 14.3 g/dL (ref 13.0–17.0)
MCH: 31.6 pg (ref 26.0–34.0)
MCHC: 32.6 g/dL (ref 30.0–36.0)
MCV: 96.9 fL (ref 80.0–100.0)
Platelets: 213 10*3/uL (ref 150–400)
RBC: 4.53 MIL/uL (ref 4.22–5.81)
RDW: 12 % (ref 11.5–15.5)
WBC: 5.8 10*3/uL (ref 4.0–10.5)
nRBC: 0 % (ref 0.0–0.2)

## 2019-10-30 LAB — SARS CORONAVIRUS 2 (TAT 6-24 HRS): SARS Coronavirus 2: NEGATIVE

## 2019-10-30 LAB — HEMOGLOBIN A1C
Hgb A1c MFr Bld: 6.7 % — ABNORMAL HIGH (ref 4.8–5.6)
Mean Plasma Glucose: 145.59 mg/dL

## 2019-10-30 LAB — SURGICAL PCR SCREEN
MRSA, PCR: NEGATIVE
Staphylococcus aureus: NEGATIVE

## 2019-10-30 LAB — PROTIME-INR
INR: 1.1 (ref 0.8–1.2)
Prothrombin Time: 14 seconds (ref 11.4–15.2)

## 2019-10-30 LAB — GLUCOSE, CAPILLARY: Glucose-Capillary: 140 mg/dL — ABNORMAL HIGH (ref 70–99)

## 2019-10-30 LAB — ABO/RH: ABO/RH(D): A POS

## 2019-10-30 NOTE — Pre-Procedure Instructions (Signed)
[id number] [place], [place] [address] [place] [address] Phone: [phone] Fax: [phone]  [facility] of [place], [place] - [address] [place] [address] Phone: [phone] Fax: [place], [employer] [place] [address] [place] [address] Phone: [phone] Fax: [phone]      Your procedure is scheduled on 11-01-19  Report to [facility] Main Entrance "A" at 0530 A.M., and check in at the Admitting office.  Call this number if you have problems the morning of surgery:  [phone]  Call [phone] if you have any questions prior to your surgery date Monday-Friday 8am-4pm    Remember:  Do not eat or drink after midnight the night before your surgery  Take these medicines the morning of surgery with A SIP OF WATER : atorvastatin (LIPITOR) gabapentin (NEURONTIN)as needed metoprolol tartrate (LOPRESSOR)    Stop eliquis 3 days prior to surgery.     7 days prior to surgery STOP taking any Aspirin (unless otherwise instructed by your surgeon), Aleve, Naproxen, Ibuprofen, Motrin, Advil, Goody's, BC's, all herbal medications, fish oil, and all vitamins.   WHAT DO I DO ABOUT MY DIABETES MEDICATION?   [name] Kitchen Do not take oral diabetes medicines (pills) including Metformin  the morning of surgery.  . THE NIGHT BEFORE SURGERY, take 7 Units  Tresiba         HOW TO MANAGE YOUR DIABETES BEFORE AND AFTER SURGERY  Why is it important to control my blood sugar before and after surgery? . Improving blood sugar levels before and after surgery helps healing and can limit problems. . A way of improving blood sugar control is eating a healthy diet by: o  Eating less sugar and carbohydrates o  Increasing activity/exercise o  Talking with your doctor about reaching your blood sugar  goals . High blood sugars (greater than 180 mg/dL) can raise your risk of infections and slow your recovery, so you will need to focus on controlling your diabetes during the weeks before surgery. . Make sure that the doctor who takes care of your diabetes knows about your planned surgery including the date and location.  How do I manage my blood sugar before surgery? . Check your blood sugar at least 4 times a day, starting 2 days before surgery, to make sure that the level is not too high or low. . Check your blood sugar the morning of your surgery when you wake up and every 2 hours until you get to the Short Stay unit. o If your blood sugar is less than 70 mg/dL, you will need to treat for low blood sugar: - Do not take insulin. - Treat a low blood sugar (less than 70 mg/dL) with  cup of clear juice (cranberry or apple), 4 glucose tablets, OR glucose gel. - Recheck blood sugar in 15 minutes after treatment (to make sure it is greater than 70 mg/dL). If your blood sugar is not greater than 70 mg/dL on recheck, call [phone] for further instructions. . Report your blood sugar to the short stay nurse when you get to Short Stay.  . If you are admitted to the hospital after surgery: o Your blood sugar will be checked by the staff and you will probably be given insulin after surgery (instead of oral diabetes medicines) to make sure you have good  blood sugar levels. o The goal for blood sugar control after surgery is 80-180 mg/dL.         The Morning of Surgery  Do not wear jewelry, make-up or nail polish.  Do not wear lotions, powders, or perfumes/colognes, or deodorant  Do not shave 48 hours prior to surgery.  Men may shave face and neck.  Do not bring valuables to the hospital.  [facility] is not responsible for any belongings or valuables.  If you are a smoker, DO NOT Smoke 24 hours prior to surgery  If you wear a CPAP at night please bring your mask, tubing, and machine the  morning of surgery   Remember that you must have someone to transport you home after your surgery, and remain with you for 24 hours if you are discharged the same day.   Please bring cases for contacts, glasses, hearing aids, dentures or bridgework because it cannot be worn into surgery.    Leave your suitcase in the car.  After surgery it may be brought to your room.  For patients admitted to the hospital, discharge time will be determined by your treatment team.  Patients discharged the day of surgery will not be allowed to drive home.    Special instructions:   [place]- Preparing For Surgery  Before surgery, you can play an important role. Because skin is not sterile, your skin needs to be as free of germs as possible. You can reduce the number of germs on your skin by washing with CHG (chlorahexidine gluconate) Soap before surgery.  CHG is an antiseptic cleaner which kills germs and bonds with the skin to continue killing germs even after washing.    Oral Hygiene is also important to reduce your risk of infection.  Remember - BRUSH YOUR TEETH THE MORNING OF SURGERY WITH YOUR REGULAR TOOTHPASTE  Please do not use if you have an allergy to CHG or antibacterial soaps. If your skin becomes reddened/irritated stop using the CHG.  Do not shave (including legs and underarms) for at least 48 hours prior to first CHG shower. It is OK to shave your face.  Please follow these instructions carefully.   1. Shower the NIGHT BEFORE SURGERY and the MORNING OF SURGERY with CHG Soap.   2. If you chose to wash your hair, wash your hair first as usual with your normal shampoo.  3. After you shampoo, rinse your hair and body thoroughly to remove the shampoo.  4. Use CHG as you would any other liquid soap. You can apply CHG directly to the skin and wash gently with a scrungie or a clean washcloth.   5. Apply the CHG Soap to your body ONLY FROM THE NECK DOWN.  Do not use on open wounds or open  sores. Avoid contact with your eyes, ears, mouth and genitals (private parts). Wash Face and genitals (private parts)  with your normal soap.   6. Wash thoroughly, paying special attention to the area where your surgery will be performed.  7. Thoroughly rinse your body with warm water from the neck down.  8. DO NOT shower/wash with your normal soap after using and rinsing off the CHG Soap.  9. Pat yourself dry with a CLEAN TOWEL.  10. Wear CLEAN PAJAMAS to bed the night before surgery, wear comfortable clothes the morning of surgery  11. Place CLEAN SHEETS on your bed the night of your first shower and DO NOT SLEEP WITH PETS.  Day of Surgery:  Please shower the morning of surgery with the CHG soap Do not apply any deodorants/lotions. Please wear clean clothes to the hospital/surgery center.   Remember to brush your teeth WITH YOUR REGULAR TOOTHPASTE.   Please read over the fact sheets that you were given.

## 2019-10-30 NOTE — Progress Notes (Signed)
PCP - Asencion Noble @ [facility] Cardiologist - Lyman Bishop  PPM/ICD - na Device Orders - na Rep Notified -   Chest x-ray - na EKG - 10-03-19 Stress Test - na ECHO - 08/22/19 Cardiac Cath - na  Sleep Study - na CPAP -   Fasting Blood Sugar - 100-115 Checks Blood Sugar ___2__ times a day  Blood Thinner Instructions: eliquis pt. Stopped per Dr. Sherwood Gambler 10/27/19 Aspirin Instructions:  ERAS Protcol -na PRE-SURGERY Ensure or G2-   COVID TEST- 10/30/19   Anesthesia review: cardiac hx./ clearance in notes  Patient denies shortness of breath, fever, cough and chest pain at PAT appointment   All instructions explained to the patient, with a verbal understanding of the material. Patient agrees to go over the instructions while at home for a better understanding. Patient also instructed to self quarantine after being tested for COVID-19. The opportunity to ask questions was provided.

## 2019-10-31 NOTE — Progress Notes (Signed)
Anesthesia Chart Review: Follows with cardiology for hx of PAF/ a-flutter s/p TEE CV 08/20/2019. Echo done 08/22/2019 showed normal LVF. Seen 10/03/19 for preop clearance. Per note, "From a cardiac standpoint he can have surgery without further cardiac workup.  OK to hold Eliquis 3 days pre op."  Preop labs reviewed, IDDMII well controlled A1c 6.7. Otherwise unremarkable.   EKG 10/03/19: Sinus bradycardia with sinus arrhythmia. Rate 57.  TTE 08/22/19:  1. Left ventricular ejection fraction, by visual estimation, is 60 to 65%. The left ventricle has normal function. Normal left ventricular size. There is no left ventricular hypertrophy.  2. Global right ventricle has normal systolic function.The right ventricular size is normal. No increase in right ventricular wall thickness.  3. Left atrial size was normal.  4. Right atrial size was mildly dilated.  5. The mitral valve is normal in structure. No evidence of mitral valve regurgitation. No evidence of mitral stenosis.  6. The tricuspid valve is normal in structure. Tricuspid valve regurgitation was not visualized by color flow Doppler.  7. The aortic valve is normal in structure. Aortic valve regurgitation was not visualized by color flow Doppler. Structurally normal aortic valve, with no evidence of sclerosis or stenosis.  8. The pulmonic valve was normal in structure. Pulmonic valve regurgitation is not visualized by color flow Doppler.  9. The inferior vena cava is normal in size with greater than 50% respiratory variability, suggesting right atrial pressure of 3 mmHg.   [facility]ynonia [facility] Short Stay Center/Anesthesiology Phone [phone] 10/31/2019 9:33 AM

## 2019-10-31 NOTE — Anesthesia Preprocedure Evaluation (Addendum)
Anesthesia Evaluation  Patient identified by MRN, date of birth, ID band Patient awake    Reviewed: Allergy & Precautions, NPO status , Patient's Chart, lab work & pertinent test results  Airway Mallampati: II  TM Distance: >3 FB Neck ROM: Full    Dental  (+) Poor Dentition, Missing,    Pulmonary asthma , former smoker,    Pulmonary exam normal breath sounds clear to auscultation       Cardiovascular Normal cardiovascular exam+ dysrhythmias Atrial Fibrillation  Rhythm:Regular Rate:Normal  ECG: SB, rate 57   Neuro/Psych PSYCHIATRIC DISORDERS Depression negative neurological ROS     GI/Hepatic negative GI ROS, Neg liver ROS,   Endo/Other  diabetes, Insulin Dependent, Oral Hypoglycemic Agents  Renal/GU negative Renal ROS     Musculoskeletal negative musculoskeletal ROS (+)   Abdominal   Peds  Hematology HLD   Anesthesia Other Findings HERNIATED NUCLEUS PULPOSUS, LUMBAR  Reproductive/Obstetrics                          Anesthesia Physical Anesthesia Plan  ASA: III  Anesthesia Plan: General   Post-op Pain Management:    Induction: Intravenous  PONV Risk Score and Plan: 3 and Midazolam, Dexamethasone, Ondansetron and Treatment may vary due to age or medical condition  Airway Management Planned: Oral ETT  Additional Equipment:   Intra-op Plan:   Post-operative Plan: Extubation in OR  Informed Consent: I have reviewed the patients History and Physical, chart, labs and discussed the procedure including the risks, benefits and alternatives for the proposed anesthesia with the patient or authorized representative who has indicated his/her understanding and acceptance.     Dental advisory given  Plan Discussed with: CRNA  Anesthesia Plan Comments: (Per PA-C: [id number] with cardiology for hx of PAF/ a-flutter s/p TEE CV 08/20/2019. Echo done 08/22/2019 showed normal LVF. Seen 10/03/19 for  preop clearance. Per note, "From a cardiac standpoint he can have surgery without further cardiac workup.  OK to hold Eliquis 3 days pre op."  Preop labs reviewed, IDDMII well controlled A1c 6.7. Otherwise unremarkable.   EKG 10/03/19: Sinus bradycardia with sinus arrhythmia. Rate 57.  TTE 08/22/19:  1. Left ventricular ejection fraction, by visual estimation, is 60 to 65%. The left ventricle has normal function. Normal left ventricular size. There is no left ventricular hypertrophy.  2. Global right ventricle has normal systolic function.The right ventricular size is normal. No increase in right ventricular wall thickness.  3. Left atrial size was normal.  4. Right atrial size was mildly dilated.  5. The mitral valve is normal in structure. No evidence of mitral valve regurgitation. No evidence of mitral stenosis.  6. The tricuspid valve is normal in structure. Tricuspid valve regurgitation was not visualized by color flow Doppler.  7. The aortic valve is normal in structure. Aortic valve regurgitation was not visualized by color flow Doppler. Structurally normal aortic valve, with no evidence of sclerosis or stenosis.  8. The pulmonic valve was normal in structure. Pulmonic valve regurgitation is not visualized by color flow Doppler.  9. The inferior vena cava is normal in size with greater than 50% respiratory variability, suggesting right atrial pressure of 3 mmHg.)      Anesthesia Quick Evaluationprog

## 2019-11-01 ENCOUNTER — Encounter (HOSPITAL_COMMUNITY): Payer: Self-pay

## 2019-11-01 ENCOUNTER — Encounter (HOSPITAL_COMMUNITY)
Admission: RE | Disposition: A | Discharge status: Home / Self Care | Payer: Self-pay | Source: Ambulatory Visit | Attending: Neurosurgery

## 2019-11-01 ENCOUNTER — Inpatient Hospital Stay (HOSPITAL_COMMUNITY): Payer: 59 | Admitting: Physician Assistant

## 2019-11-01 ENCOUNTER — Inpatient Hospital Stay (HOSPITAL_COMMUNITY): Payer: 59

## 2019-11-01 ENCOUNTER — Inpatient Hospital Stay (HOSPITAL_COMMUNITY): Payer: 59 | Admitting: Anesthesiology

## 2019-11-01 ENCOUNTER — Inpatient Hospital Stay (HOSPITAL_COMMUNITY)
Admission: RE | Admit: 2019-11-01 | Discharge: 2019-11-02 | DRG: 455 | Disposition: A | Discharge status: Home / Self Care | Payer: 59 | Attending: Neurosurgery | Admitting: Neurosurgery

## 2019-11-01 ENCOUNTER — Other Ambulatory Visit: Payer: Self-pay

## 2019-11-01 DIAGNOSIS — Z794 Long term (current) use of insulin: Secondary | ICD-10-CM | POA: Diagnosis not present

## 2019-11-01 DIAGNOSIS — J45909 Unspecified asthma, uncomplicated: Secondary | ICD-10-CM | POA: Diagnosis present

## 2019-11-01 DIAGNOSIS — M5116 Intervertebral disc disorders with radiculopathy, lumbar region: Principal | ICD-10-CM | POA: Diagnosis present

## 2019-11-01 DIAGNOSIS — E1169 Type 2 diabetes mellitus with other specified complication: Secondary | ICD-10-CM | POA: Diagnosis present

## 2019-11-01 DIAGNOSIS — M5126 Other intervertebral disc displacement, lumbar region: Secondary | ICD-10-CM | POA: Diagnosis present

## 2019-11-01 DIAGNOSIS — E785 Hyperlipidemia, unspecified: Secondary | ICD-10-CM | POA: Diagnosis present

## 2019-11-01 DIAGNOSIS — Z20828 Contact with and (suspected) exposure to other viral communicable diseases: Secondary | ICD-10-CM | POA: Diagnosis present

## 2019-11-01 DIAGNOSIS — M8938 Hypertrophy of bone, other site: Secondary | ICD-10-CM | POA: Diagnosis present

## 2019-11-01 DIAGNOSIS — M4726 Other spondylosis with radiculopathy, lumbar region: Secondary | ICD-10-CM | POA: Diagnosis present

## 2019-11-01 DIAGNOSIS — Z981 Arthrodesis status: Secondary | ICD-10-CM | POA: Diagnosis not present

## 2019-11-01 DIAGNOSIS — Z87891 Personal history of nicotine dependence: Secondary | ICD-10-CM | POA: Diagnosis not present

## 2019-11-01 DIAGNOSIS — Z79899 Other long term (current) drug therapy: Secondary | ICD-10-CM

## 2019-11-01 DIAGNOSIS — M48061 Spinal stenosis, lumbar region without neurogenic claudication: Secondary | ICD-10-CM | POA: Diagnosis present

## 2019-11-01 DIAGNOSIS — Z419 Encounter for procedure for purposes other than remedying health state, unspecified: Secondary | ICD-10-CM

## 2019-11-01 DIAGNOSIS — I480 Paroxysmal atrial fibrillation: Secondary | ICD-10-CM | POA: Diagnosis present

## 2019-11-01 DIAGNOSIS — Z7901 Long term (current) use of anticoagulants: Secondary | ICD-10-CM | POA: Diagnosis not present

## 2019-11-01 HISTORY — DX: Other intervertebral disc displacement, lumbar region: M51.26

## 2019-11-01 LAB — GLUCOSE, CAPILLARY
Glucose-Capillary: 91 mg/dL (ref 70–99)
Glucose-Capillary: 158 mg/dL — ABNORMAL HIGH (ref 70–99)
Glucose-Capillary: 257 mg/dL — ABNORMAL HIGH (ref 70–99)
Glucose-Capillary: 202 mg/dL — ABNORMAL HIGH (ref 70–99)

## 2019-11-01 SURGERY — POSTERIOR LUMBAR FUSION 1 LEVEL
Anesthesia: General | Site: Back

## 2019-11-01 MED ORDER — FLEET ENEMA 7-19 GM/118ML RE ENEM: 1.0000 | ENEMA | Freq: Once | RECTAL | Status: DC | PRN

## 2019-11-01 MED ORDER — ONDANSETRON HCL 4 MG PO TABS: 4.0000 mg | ORAL_TABLET | Freq: Four times a day (QID) | ORAL | Status: DC | PRN

## 2019-11-01 MED ORDER — LIDOCAINE 2% (20 MG/ML) 5 ML SYRINGE
INTRAMUSCULAR | Status: AC
  Filled 2019-11-01: qty 5

## 2019-11-01 MED ORDER — ONDANSETRON HCL 4 MG/2ML IJ SOLN
INTRAMUSCULAR | Status: AC
  Filled 2019-11-01: qty 2

## 2019-11-01 MED ORDER — PHENOL 1.4 % MT LIQD: 1.0000 | OROMUCOSAL | Status: DC | PRN

## 2019-11-01 MED ORDER — FENTANYL CITRATE (PF) 100 MCG/2ML IJ SOLN: 25.0000 ug | INTRAMUSCULAR | Status: DC | PRN

## 2019-11-01 MED ORDER — THROMBIN 20000 UNITS EX SOLR
CUTANEOUS | Status: AC
  Filled 2019-11-01: qty 20000

## 2019-11-01 MED ORDER — PROPOFOL 10 MG/ML IV BOLUS
INTRAVENOUS | Status: AC
  Filled 2019-11-01: qty 20

## 2019-11-01 MED ORDER — PHENYLEPHRINE 40 MCG/ML (10ML) SYRINGE FOR IV PUSH (FOR BLOOD PRESSURE SUPPORT)
PREFILLED_SYRINGE | INTRAVENOUS | Status: AC
  Filled 2019-11-01: qty 10

## 2019-11-01 MED ORDER — FENTANYL CITRATE (PF) 100 MCG/2ML IJ SOLN
INTRAMUSCULAR | Status: DC | PRN
  Administered 2019-11-01 (×3): 50 ug via INTRAVENOUS
  Administered 2019-11-01: 100 ug via INTRAVENOUS
  Administered 2019-11-01 (×2): 50 ug via INTRAVENOUS

## 2019-11-01 MED ORDER — GABAPENTIN 300 MG PO CAPS
300.0000 mg | ORAL_CAPSULE | Freq: Two times a day (BID) | ORAL | Status: DC
  Administered 2019-11-01: 300 mg via ORAL
  Filled 2019-11-01: qty 1

## 2019-11-01 MED ORDER — FENTANYL CITRATE (PF) 250 MCG/5ML IJ SOLN
INTRAMUSCULAR | Status: AC
  Filled 2019-11-01: qty 5

## 2019-11-01 MED ORDER — ACETAMINOPHEN 650 MG RE SUPP: 650.0000 mg | RECTAL | Status: DC | PRN

## 2019-11-01 MED ORDER — SODIUM CHLORIDE 0.9 % IV SOLN: 250.0000 mL | INTRAVENOUS | Status: DC

## 2019-11-01 MED ORDER — MENTHOL 3 MG MT LOZG: 1.0000 | LOZENGE | OROMUCOSAL | Status: DC | PRN

## 2019-11-01 MED ORDER — ATORVASTATIN CALCIUM 10 MG PO TABS
10.0000 mg | ORAL_TABLET | Freq: Every day | ORAL | Status: DC
  Administered 2019-11-01: 10 mg via ORAL
  Filled 2019-11-01 (×2): qty 1

## 2019-11-01 MED ORDER — DEXAMETHASONE SODIUM PHOSPHATE 10 MG/ML IJ SOLN
INTRAMUSCULAR | Status: AC
  Filled 2019-11-01: qty 1

## 2019-11-01 MED ORDER — ACETAMINOPHEN 325 MG PO TABS: 650.0000 mg | ORAL_TABLET | ORAL | Status: DC | PRN

## 2019-11-01 MED ORDER — CYCLOBENZAPRINE HCL 5 MG PO TABS: 5.0000 mg | ORAL_TABLET | Freq: Three times a day (TID) | ORAL | Status: DC | PRN

## 2019-11-01 MED ORDER — PROPOFOL 10 MG/ML IV BOLUS
INTRAVENOUS | Status: DC | PRN
  Administered 2019-11-01: 50 mg via INTRAVENOUS
  Administered 2019-11-01: 100 mg via INTRAVENOUS
  Administered 2019-11-01: 50 mg via INTRAVENOUS

## 2019-11-01 MED ORDER — BISACODYL 10 MG RE SUPP: 10.0000 mg | Freq: Every day | RECTAL | Status: DC | PRN

## 2019-11-01 MED ORDER — SODIUM CHLORIDE 0.9% FLUSH: 3.0000 mL | INTRAVENOUS | Status: DC | PRN

## 2019-11-01 MED ORDER — LIDOCAINE-EPINEPHRINE 1 %-1:100000 IJ SOLN
INTRAMUSCULAR | Status: DC | PRN
  Administered 2019-11-01: 15 mL

## 2019-11-01 MED ORDER — POTASSIUM CHLORIDE IN NACL 20-0.9 MEQ/L-% IV SOLN: INTRAVENOUS | Status: DC

## 2019-11-01 MED ORDER — PHENYLEPHRINE HCL (PRESSORS) 10 MG/ML IV SOLN
INTRAVENOUS | Status: DC | PRN
  Administered 2019-11-01: 80 ug via INTRAVENOUS

## 2019-11-01 MED ORDER — ROCURONIUM BROMIDE 10 MG/ML (PF) SYRINGE
PREFILLED_SYRINGE | INTRAVENOUS | Status: AC
  Filled 2019-11-01: qty 10

## 2019-11-01 MED ORDER — ACETAMINOPHEN 500 MG PO TABS
ORAL_TABLET | ORAL | Status: AC
  Administered 2019-11-01: 1000 mg via ORAL
  Filled 2019-11-01: qty 2

## 2019-11-01 MED ORDER — ONDANSETRON HCL 4 MG/2ML IJ SOLN
INTRAMUSCULAR | Status: DC | PRN
  Administered 2019-11-01: 4 mg via INTRAVENOUS

## 2019-11-01 MED ORDER — PHENYLEPHRINE HCL-NACL 10-0.9 MG/250ML-% IV SOLN
INTRAVENOUS | Status: DC | PRN
  Administered 2019-11-01: 30 ug/min via INTRAVENOUS

## 2019-11-01 MED ORDER — CEFAZOLIN SODIUM-DEXTROSE 2-4 GM/100ML-% IV SOLN
2.0000 g | INTRAVENOUS | Status: AC
  Administered 2019-11-01 (×2): 2 g via INTRAVENOUS

## 2019-11-01 MED ORDER — THROMBIN 5000 UNITS EX SOLR
CUTANEOUS | Status: AC
  Filled 2019-11-01: qty 5000

## 2019-11-01 MED ORDER — EPHEDRINE 5 MG/ML INJ
INTRAVENOUS | Status: AC
  Filled 2019-11-01: qty 10

## 2019-11-01 MED ORDER — MIDAZOLAM HCL 2 MG/2ML IJ SOLN
INTRAMUSCULAR | Status: AC
  Filled 2019-11-01: qty 2

## 2019-11-01 MED ORDER — KETOROLAC TROMETHAMINE 30 MG/ML IJ SOLN
INTRAMUSCULAR | Status: AC
  Filled 2019-11-01: qty 1

## 2019-11-01 MED ORDER — MIDAZOLAM HCL 5 MG/5ML IJ SOLN
INTRAMUSCULAR | Status: DC | PRN
  Administered 2019-11-01: 2 mg via INTRAVENOUS

## 2019-11-01 MED ORDER — BUPIVACAINE HCL (PF) 0.5 % IJ SOLN
INTRAMUSCULAR | Status: DC | PRN
  Administered 2019-11-01: 15 mL

## 2019-11-01 MED ORDER — MAGNESIUM HYDROXIDE 400 MG/5ML PO SUSP: 30.0000 mL | Freq: Every day | ORAL | Status: DC | PRN

## 2019-11-01 MED ORDER — KETOROLAC TROMETHAMINE 30 MG/ML IJ SOLN
15.0000 mg | Freq: Four times a day (QID) | INTRAMUSCULAR | Status: DC
  Administered 2019-11-01 – 2019-11-02 (×3): 15 mg via INTRAVENOUS
  Filled 2019-11-01 (×3): qty 1

## 2019-11-01 MED ORDER — BUPIVACAINE HCL (PF) 0.5 % IJ SOLN
INTRAMUSCULAR | Status: AC
  Filled 2019-11-01: qty 30

## 2019-11-01 MED ORDER — SUGAMMADEX SODIUM 500 MG/5ML IV SOLN
INTRAVENOUS | Status: DC | PRN
  Administered 2019-11-01: 200 mg via INTRAVENOUS

## 2019-11-01 MED ORDER — ONDANSETRON HCL 4 MG/2ML IJ SOLN
4.0000 mg | Freq: Once | INTRAMUSCULAR | Status: AC | PRN
  Administered 2019-11-01: 13:00:00 4 mg via INTRAVENOUS

## 2019-11-01 MED ORDER — INSULIN GLARGINE 100 UNIT/ML ~~LOC~~ SOLN
15.0000 [IU] | Freq: Every day | SUBCUTANEOUS | Status: DC
  Administered 2019-11-01: 22:00:00 15 [IU] via SUBCUTANEOUS
  Filled 2019-11-01 (×2): qty 0.15

## 2019-11-01 MED ORDER — THROMBIN 20000 UNITS EX SOLR
CUTANEOUS | Status: DC | PRN
  Administered 2019-11-01: 09:00:00 via TOPICAL

## 2019-11-01 MED ORDER — 0.9 % SODIUM CHLORIDE (POUR BTL) OPTIME
TOPICAL | Status: DC | PRN
  Administered 2019-11-01 (×2): 1000 mL

## 2019-11-01 MED ORDER — HYDROCODONE-ACETAMINOPHEN 5-325 MG PO TABS: 1.0000 | ORAL_TABLET | ORAL | Status: DC | PRN

## 2019-11-01 MED ORDER — ROCURONIUM BROMIDE 50 MG/5ML IV SOSY
PREFILLED_SYRINGE | INTRAVENOUS | Status: DC | PRN
  Administered 2019-11-01 (×2): 50 mg via INTRAVENOUS

## 2019-11-01 MED ORDER — HYDROXYZINE HCL 50 MG/ML IM SOLN
50.0000 mg | INTRAMUSCULAR | Status: DC | PRN
  Administered 2019-11-01: 14:00:00 50 mg via INTRAMUSCULAR
  Filled 2019-11-01: qty 1

## 2019-11-01 MED ORDER — DEXAMETHASONE SODIUM PHOSPHATE 10 MG/ML IJ SOLN
INTRAMUSCULAR | Status: DC | PRN
  Administered 2019-11-01: 10 mg via INTRAVENOUS

## 2019-11-01 MED ORDER — KETOROLAC TROMETHAMINE 30 MG/ML IJ SOLN
15.0000 mg | Freq: Once | INTRAMUSCULAR | Status: AC
  Administered 2019-11-01: 15 mg via INTRAVENOUS

## 2019-11-01 MED ORDER — CHLORHEXIDINE GLUCONATE CLOTH 2 % EX PADS: 6.0000 | MEDICATED_PAD | Freq: Once | CUTANEOUS | Status: DC

## 2019-11-01 MED ORDER — INSULIN ASPART 100 UNIT/ML ~~LOC~~ SOLN
0.0000 [IU] | Freq: Every day | SUBCUTANEOUS | Status: DC
  Administered 2019-11-01: 2 [IU] via SUBCUTANEOUS

## 2019-11-01 MED ORDER — METFORMIN HCL 500 MG PO TABS: 1000.0000 mg | ORAL_TABLET | Freq: Two times a day (BID) | ORAL | Status: DC

## 2019-11-01 MED ORDER — LIDOCAINE-EPINEPHRINE 1 %-1:100000 IJ SOLN
INTRAMUSCULAR | Status: AC
  Filled 2019-11-01: qty 1

## 2019-11-01 MED ORDER — ONDANSETRON HCL 4 MG/2ML IJ SOLN: 4.0000 mg | Freq: Four times a day (QID) | INTRAMUSCULAR | Status: DC | PRN

## 2019-11-01 MED ORDER — ALUM & MAG HYDROXIDE-SIMETH 200-200-20 MG/5ML PO SUSP: 30.0000 mL | Freq: Four times a day (QID) | ORAL | Status: DC | PRN

## 2019-11-01 MED ORDER — INSULIN ASPART 100 UNIT/ML ~~LOC~~ SOLN
0.0000 [IU] | Freq: Three times a day (TID) | SUBCUTANEOUS | Status: DC
  Administered 2019-11-01: 18:00:00 8 [IU] via SUBCUTANEOUS

## 2019-11-01 MED ORDER — THROMBIN 5000 UNITS EX SOLR
OROMUCOSAL | Status: DC | PRN
  Administered 2019-11-01: 09:00:00 via TOPICAL

## 2019-11-01 MED ORDER — ACETAMINOPHEN 500 MG PO TABS
1000.0000 mg | ORAL_TABLET | Freq: Once | ORAL | Status: AC
  Administered 2019-11-01: 07:00:00 1000 mg via ORAL

## 2019-11-01 MED ORDER — MORPHINE SULFATE (PF) 4 MG/ML IV SOLN: 4.0000 mg | INTRAVENOUS | Status: DC | PRN

## 2019-11-01 MED ORDER — EPHEDRINE SULFATE 50 MG/ML IJ SOLN
INTRAMUSCULAR | Status: DC | PRN
  Administered 2019-11-01: 10 mg via INTRAVENOUS
  Administered 2019-11-01: 5 mg via INTRAVENOUS

## 2019-11-01 MED ORDER — METOPROLOL TARTRATE 12.5 MG HALF TABLET: 12.5000 mg | ORAL_TABLET | Freq: Two times a day (BID) | ORAL | Status: DC

## 2019-11-01 MED ORDER — SODIUM CHLORIDE 0.9 % IV SOLN
INTRAVENOUS | Status: DC | PRN
  Administered 2019-11-01: 09:00:00

## 2019-11-01 MED ORDER — MUPIROCIN 2 % EX OINT
TOPICAL_OINTMENT | CUTANEOUS | Status: AC
  Filled 2019-11-01: qty 22

## 2019-11-01 MED ORDER — HYDROXYZINE HCL 25 MG PO TABS: 50.0000 mg | ORAL_TABLET | ORAL | Status: DC | PRN

## 2019-11-01 MED ORDER — LIDOCAINE 2% (20 MG/ML) 5 ML SYRINGE
INTRAMUSCULAR | Status: DC | PRN
  Administered 2019-11-01: 50 mg via INTRAVENOUS

## 2019-11-01 MED ORDER — SODIUM CHLORIDE 0.9% FLUSH
3.0000 mL | Freq: Two times a day (BID) | INTRAVENOUS | Status: DC
  Administered 2019-11-01: 22:00:00 3 mL via INTRAVENOUS

## 2019-11-01 MED ORDER — LACTATED RINGERS IV SOLN
INTRAVENOUS | Status: DC | PRN
  Administered 2019-11-01 (×2): via INTRAVENOUS

## 2019-11-01 SURGICAL SUPPLY — 66 items
BAG DECANTER FOR FLEXI CONT (MISCELLANEOUS) ×2 IMPLANT
BENZOIN TINCTURE PRP APPL 2/3 (GAUZE/BANDAGES/DRESSINGS) ×2 IMPLANT
BLADE CLIPPER SURG (BLADE) IMPLANT
BUR ACRON 5.0MM COATED (BURR) ×4 IMPLANT
BUR MATCHSTICK NEURO 3.0 LAGG (BURR) ×2 IMPLANT
CAGE LUM TRIT 9X23X12 6D (Cage) ×4 IMPLANT
CANISTER SUCT 3000ML PPV (MISCELLANEOUS) ×2 IMPLANT
CAP LCK SPNE (Orthopedic Implant) ×4 IMPLANT
CAP LOCK SPINE RADIUS (Orthopedic Implant) ×4 IMPLANT
CAP LOCKING (Orthopedic Implant) ×4 IMPLANT
CARTRIDGE OIL MAESTRO DRILL (MISCELLANEOUS) ×1 IMPLANT
CONT SPEC 4OZ CLIKSEAL STRL BL (MISCELLANEOUS) ×2 IMPLANT
COVER BACK TABLE 60X90IN (DRAPES) ×2 IMPLANT
DECANTER SPIKE VIAL GLASS SM (MISCELLANEOUS) ×2 IMPLANT
DERMABOND ADVANCED (GAUZE/BANDAGES/DRESSINGS) ×1
DERMABOND ADVANCED .7 DNX12 (GAUZE/BANDAGES/DRESSINGS) ×1 IMPLANT
DIFFUSER DRILL AIR PNEUMATIC (MISCELLANEOUS) ×2 IMPLANT
DRAPE C-ARM 42X72 X-RAY (DRAPES) ×4 IMPLANT
DRAPE C-ARMOR (DRAPES) ×2 IMPLANT
DRAPE HALF SHEET 40X57 (DRAPES) IMPLANT
DRAPE LAPAROTOMY 100X72X124 (DRAPES) ×2 IMPLANT
DRAPE POUCH INSTRU U-SHP 10X18 (DRAPES) ×2 IMPLANT
ELECT REM PT RETURN 9FT ADLT (ELECTROSURGICAL) ×2
ELECTRODE REM PT RTRN 9FT ADLT (ELECTROSURGICAL) ×1 IMPLANT
GAUZE 4X4 16PLY RFD (DISPOSABLE) ×2 IMPLANT
GAUZE SPONGE 4X4 12PLY STRL (GAUZE/BANDAGES/DRESSINGS) ×2 IMPLANT
GLOVE BIOGEL PI IND STRL 8 (GLOVE) ×6 IMPLANT
GLOVE BIOGEL PI INDICATOR 8 (GLOVE) ×6
GLOVE ECLIPSE 7.5 STRL STRAW (GLOVE) ×12 IMPLANT
GLOVE SURG SS PI 6.5 STRL IVOR (GLOVE) ×2 IMPLANT
GOWN STRL REUS W/ TWL LRG LVL3 (GOWN DISPOSABLE) IMPLANT
GOWN STRL REUS W/ TWL XL LVL3 (GOWN DISPOSABLE) ×3 IMPLANT
GOWN STRL REUS W/TWL 2XL LVL3 (GOWN DISPOSABLE) IMPLANT
GOWN STRL REUS W/TWL LRG LVL3 (GOWN DISPOSABLE)
GOWN STRL REUS W/TWL XL LVL3 (GOWN DISPOSABLE) ×3
HEMOSTAT POWDER KIT SURGIFOAM (HEMOSTASIS) ×2 IMPLANT
KIT BASIN OR (CUSTOM PROCEDURE TRAY) ×2 IMPLANT
KIT INFUSE SMALL (Orthopedic Implant) ×2 IMPLANT
KIT TURNOVER KIT B (KITS) ×2 IMPLANT
NEEDLE ASP BONE MRW 8GX15 (NEEDLE) ×2 IMPLANT
NEEDLE SPNL 18GX3.5 QUINCKE PK (NEEDLE) ×2 IMPLANT
NEEDLE SPNL 22GX3.5 QUINCKE BK (NEEDLE) ×2 IMPLANT
NS IRRIG 1000ML POUR BTL (IV SOLUTION) ×4 IMPLANT
OIL CARTRIDGE MAESTRO DRILL (MISCELLANEOUS) ×2
PACK LAMINECTOMY NEURO (CUSTOM PROCEDURE TRAY) ×2 IMPLANT
PATTIES SURGICAL .5 X.5 (GAUZE/BANDAGES/DRESSINGS) IMPLANT
PATTIES SURGICAL .5 X1 (DISPOSABLE) IMPLANT
PATTIES SURGICAL 1X1 (DISPOSABLE) IMPLANT
ROD RADIUS 35MM (Rod) ×2 IMPLANT
ROD RADIUS 40MM (Neuro Prosthesis/Implant) ×1 IMPLANT
ROD SPNL 40X5.5XNS TI RDS (Neuro Prosthesis/Implant) ×1 IMPLANT
SCREW 5.75X45MM (Screw) ×8 IMPLANT
SPONGE LAP 4X18 RFD (DISPOSABLE) IMPLANT
SPONGE NEURO XRAY DETECT 1X3 (DISPOSABLE) IMPLANT
SPONGE SURGIFOAM ABS GEL 100 (HEMOSTASIS) ×2 IMPLANT
STRIP BIOACTIVE VITOSS 25X100X (Neuro Prosthesis/Implant) ×4 IMPLANT
SUT VIC AB 1 CT1 18XBRD ANBCTR (SUTURE) ×2 IMPLANT
SUT VIC AB 1 CT1 8-18 (SUTURE) ×2
SUT VIC AB 2-0 CP2 18 (SUTURE) ×4 IMPLANT
SYR 3ML LL SCALE MARK (SYRINGE) IMPLANT
SYR CONTROL 10ML LL (SYRINGE) ×2 IMPLANT
TAPE CLOTH SURG 4X10 WHT LF (GAUZE/BANDAGES/DRESSINGS) ×2 IMPLANT
TOWEL GREEN STERILE (TOWEL DISPOSABLE) ×2 IMPLANT
TOWEL GREEN STERILE FF (TOWEL DISPOSABLE) ×2 IMPLANT
TRAY FOLEY MTR SLVR 16FR STAT (SET/KITS/TRAYS/PACK) ×2 IMPLANT
WATER STERILE IRR 1000ML POUR (IV SOLUTION) ×2 IMPLANT

## 2019-11-01 NOTE — Anesthesia Postprocedure Evaluation (Signed)
Anesthesia Post Note  Patient: Cameron Arroyo  Procedure(s) Performed: LUMBAR FOUR- LUMBAR FIVE DECOMPRESSION, POSTERIOR LUMBAR INTERBODY FUSION, POSTERIOR LATERAL ARTHRODESIS (N/A Back)     Patient location during evaluation: PACU Anesthesia Type: General Level of consciousness: awake and alert Pain management: pain level controlled Vital Signs Assessment: post-procedure vital signs reviewed and stable Respiratory status: spontaneous breathing, nonlabored ventilation, respiratory function stable and patient connected to nasal cannula oxygen Cardiovascular status: blood pressure returned to baseline and stable Postop Assessment: no apparent nausea or vomiting Anesthetic complications: no    Last Vitals:  Vitals:   11/01/19 1307 11/01/19 1536  BP: 119/69 (!) 104/55  Pulse: 67 68  Resp: 18 17  Temp: (!) 36.4 C 36.7 C  SpO2: 99% 100%    Last Pain:  Vitals:   11/01/19 1350  TempSrc:   PainSc: 0-No pain                 Ryan P Ellender

## 2019-11-01 NOTE — H&P (Signed)
Subjective: Patient is a 67 y.o. right-handed white male who is admitted for treatment of left lumbar radiculopathy secondary to a large left L4-5 foraminal/extraforaminal disc herniation with significant left L4 nerve root compression.  His examination shows weakness of the left iliopsoas and EHL.  Symptoms began 3 months ago without any particular inciting cause.  He developed pain in the anterior left knee that extended back into the lateral left thigh, then into the anterior left thigh, and then into the lateral left hip and left buttock.  He has had discomfort as well into the left side of his low back.  Patient is found that his left knee has had a tendency to give away and he is fallen several times.  Pain is aggravated by standing, walking, and lying down.  He has been taking gabapentin 3 mg twice daily.  Because of persistence of symptoms despite nonsurgical management, the patient is brought to surgery which will require complete left L4-5 facetectomy and therefore require stabilization.  We have therefore recommended a bilateral L4-5 lumbar decompression, including laminectomy, facetectomy, foraminotomy, and microdiscectomy as well as bilateral L4-5 stabilization via bilateral L4-5 posterior lumbar interbody arthrodesis with interbody implants and bone graft and bilateral L4-5 posterior lateral arthrodesis with posterior instrumentation and bone graft.   Patient Active Problem List   Diagnosis Date Noted  . Chronic anticoagulation 10/03/2019  . Pre-operative clearance 10/03/2019  . Pain due to onychomycosis of toenails of both feet 09/14/2019  . Hyperlipidemia associated with type 2 diabetes mellitus ([place]) 09/12/2019  . Reactive depression 09/11/2019  . Periodontal disease 09/11/2019  . Dental caries 09/11/2019  . Lumbar spondylosis with myelopathy 09/10/2019  . Non-insulin treated type 2 diabetes mellitus ([place])   . PAF (paroxysmal atrial fibrillation) ([place]) 08/20/2019  . Unintentional  weight loss 08/20/2019  . Pain in left knee 08/08/2019   Past Medical History:  Diagnosis Date  . Arthritis   . Asthma    as child  . Atrial flutter with rapid ventricular response ([place]) 08/20/2019  . New onset type 2 diabetes mellitus ([place]) 08/20/2019  . Pneumonia    history of    Past Surgical History:  Procedure Laterality Date  . ANTERIOR CERVICAL DECOMP/DISCECTOMY FUSION N/A 03/01/2013   Procedure: Cervical Four-Five Cervical Five-Six Cervical Six-Seven anterior cervical decompression with fusion plating and bonegraft;  Surgeon: Hosie Spangle, MD;  Location: [place] NEURO ORS;  Service: Neurosurgery;  Laterality: N/A;  ANTERIOR CERVICAL DECOMPRESSION/DISCECTOMY FUSION 3 LEVELS  . CARDIOVERSION N/A 08/22/2019   Procedure: CARDIOVERSION;  Surgeon: Donato Heinz, MD;  Location[facility] ENDOSCOPY;  Service: Endoscopy;  Laterality: N/A;  . TEE WITHOUT CARDIOVERSION N/A 08/22/2019   Procedure: TRANSESOPHAGEAL ECHOCARDIOGRAM (TEE);  Surgeon: Donato Heinz, MD;  Location: [facility] ENDOSCOPY;  Service: Endoscopy;  Laterality: N/A;    Medications Prior to Admission  Medication Sig Dispense Refill Last Dose  . apixaban (ELIQUIS) 5 MG TABS tablet Take 1 tablet (5 mg total) by mouth 2 (two) times daily. 180 tablet 1 10/27/2019  . atorvastatin (LIPITOR) 10 MG tablet Take 1 tablet (10 mg total) by mouth daily. 90 tablet 0 10/31/2019 at Unknown time  . gabapentin (NEURONTIN) 300 MG capsule Take 300 mg by mouth daily as needed (pain).    10/31/2019 at Unknown time  . insulin degludec (TRESIBA FLEXTOUCH) 100 UNIT/ML SOPN FlexTouch Pen Inject 0.15 mLs (15 Units total) into the skin at bedtime. 15 mL 0 10/31/2019 at Unknown time  . metFORMIN (GLUCOPHAGE) 1000 MG tablet Take 1 tablet (  1,000 mg total) by mouth 2 (two) times daily with a meal. 180 tablet 0 10/31/2019 at Unknown time  . metoprolol tartrate (LOPRESSOR) 25 MG tablet Take 0.5 tablets (12.5 mg total) by mouth 2 (two) times daily. 90 tablet 0  10/31/2019 at Unknown time   No Known Allergies  Social History   Tobacco Use  . Smoking status: Former Smoker    Quit date: 11/29/1992    Years since quitting: 26.9  . Smokeless tobacco: Never Used  Substance Use Topics  . Alcohol use: No    Family History  Problem Relation Age of Onset  . Cirrhosis Mother   . Cancer Father      Review of Systems Pertinent items noted in HPI and remainder of comprehensive ROS otherwise negative.  Objective: Vital signs in last 24 hours: Temp:  [98.3 F (36.8 C)] 98.3 F (36.8 C) (12/03 0550) Resp:  [18] 18 (12/03 0550) BP: (118)/(63) 118/63 (12/03 0550) SpO2:  [100 %] 100 % (12/03 0550) Weight:  [86.6 kg] 86.6 kg (12/03 0550)  EXAM: Patient is a well-developed, well-nourished white male in no acute distress.   Lungs are clear to auscultation , the patient has symmetrical respiratory excursion. Heart has a regular rate and rhythm normal S1 and S2 no murmur.   Abdomen is soft nontender nondistended bowel sounds are present. Extremity examination shows no clubbing cyanosis or edema. Neurologic examination shows weakness of the left iliopsoas 4 -/5, right iliopsoas is 5/5.  Quadriceps, dorsiflexor, and plantar flexor are all 5/5 bilaterally.  Left EHL is 4 -/5, right EHL is 5/5.  Sensation is intact pinprick to the distal lower extremities.  Reflex examination shows left quadriceps is trace, right quadriceps is 2, gastrocnemius are absent bilaterally.  Toes are downgoing bilaterally.  Gait and stance favors the left lower extremity.  Data Review:CBC    Component Value Date/Time   WBC 5.8 10/30/2019 0939   RBC 4.53 10/30/2019 0939   HGB 14.3 10/30/2019 0939   HCT 43.9 10/30/2019 0939   PLT 213 10/30/2019 0939   MCV 96.9 10/30/2019 0939   MCH 31.6 10/30/2019 0939   MCHC 32.6 10/30/2019 0939   RDW 12.0 10/30/2019 0939   LYMPHSABS 3.0 08/22/2019 0310   MONOABS 0.4 08/22/2019 0310   EOSABS 0.1 08/22/2019 0310   BASOSABS 0.0 08/22/2019 0310                           BMET    Component Value Date/Time   NA 141 10/30/2019 0939   NA 141 09/11/2019 1145   K 4.1 10/30/2019 0939   CL 105 10/30/2019 0939   CO2 23 10/30/2019 0939   GLUCOSE 132 (H) 10/30/2019 0939   BUN 17 10/30/2019 0939   BUN 14 09/11/2019 1145   CREATININE 0.83 10/30/2019 0939   CALCIUM 9.1 10/30/2019 0939   GFRNONAA >60 10/30/2019 0939   GFRAA >60 10/30/2019 0939     Assessment/Plan: Patient with persistent disabling left lumbar radiculopathy with significant weakness who is admitted now for L4-5 lumbar decompression and stabilization.  I've discussed with the patient the nature of his condition, the nature the surgical procedure, the typical length of surgery, hospital stay, and overall recuperation, the limitations postoperatively, and risks of surgery. I discussed risks including risks of infection, bleeding, possibly need for transfusion, the risk of nerve root dysfunction with pain, weakness, numbness, or paresthesias, the risk of dural tear and CSF leakage and possible need for  further surgery, the risk of failure of the arthrodesis and possibly for further surgery, the risk of anesthetic complications including myocardial infarction, stroke, pneumonia, and death. We discussed the need for postoperative immobilization in a lumbar brace. Understanding all this the patient does wish to proceed with surgery and is admitted for such.  Hosie Spangle, MD 11/01/2019 7:04 AM

## 2019-11-01 NOTE — Progress Notes (Signed)
Vitals:   11/01/19 1221 11/01/19 1236 11/01/19 1307 11/01/19 1536  BP: 119/64 118/61 119/69 (!) 104/55  Pulse: 77 73 67 68  Resp: 11 16 18 17   Temp:  97.8 F (36.6 C) (!) 97.5 F (36.4 C) 98 F (36.7 C)  TempSrc:   Oral   SpO2: 97% 96% 99% 100%  Weight:      Height:        CBC Recent Labs    10/30/19 0939  WBC 5.8  HGB 14.3  HCT 43.9  PLT 213   BMET Recent Labs    10/30/19 0939  NA 141  K 4.1  CL 105  CO2 23  GLUCOSE 132*  BUN 17  CREATININE 0.83  CALCIUM 9.1    Patient resting in bed, comfortable.  Has been up and ambulating.  Has voided, since Foley DC'd.  Dressing clean and dry.  Plan: Doing well following lumbar decompression and arthrodesis earlier today.  Continue to progress through postoperative recovery.  Encouraged to ambulate.  Hosie Spangle, MD 11/01/2019, 5:34 PM

## 2019-11-01 NOTE — Transfer of Care (Signed)
Immediate Anesthesia Transfer of Care Note  Patient: Cameron Arroyo  Procedure(s) Performed: LUMBAR FOUR- LUMBAR FIVE DECOMPRESSION, POSTERIOR LUMBAR INTERBODY FUSION, POSTERIOR LATERAL ARTHRODESIS (N/A Back)  Patient Location: PACU  Anesthesia Type:General  Level of Consciousness: sedated  Airway & Oxygen Therapy: Patient Spontanous Breathing and Patient connected to nasal cannula oxygen  Post-op Assessment: Report given to RN, Post -op Vital signs reviewed and stable and Patient moving all extremities X 4  Post vital signs: Reviewed and stable  Last Vitals:  Vitals Value Taken Time  BP 113/65 11/01/19 1151  Temp    Pulse 67 11/01/19 1151  Resp 12 11/01/19 1151  SpO2 100 % 11/01/19 1151  Vitals shown include unvalidated device data.  Last Pain:  Vitals:   11/01/19 0629  TempSrc:   PainSc: 0-No pain         Complications: No apparent anesthesia complications

## 2019-11-01 NOTE — Anesthesia Procedure Notes (Signed)
Procedure Name: Intubation Date/Time: 11/01/2019 7:33 AM Performed by: Neldon Newport, CRNA Pre-anesthesia Checklist: Timeout performed, Patient being monitored, Emergency Drugs available, Suction available and Patient identified Patient Re-evaluated:Patient Re-evaluated prior to induction Oxygen Delivery Method: Circle system utilized Preoxygenation: Pre-oxygenation with 100% oxygen Induction Type: IV induction Ventilation: Mask ventilation without difficulty Laryngoscope Size: Mac, 4 and Glidescope (Couldnt advance Mac blade due to dentition.[name] Kitchen) Grade View: Grade II Tube type: Oral Number of attempts: 2 Placement Confirmation: breath sounds checked- equal and bilateral,  positive ETCO2 and ETT inserted through vocal cords under direct vision Secured at: 25 cm Tube secured with: Tape Dental Injury: Teeth and Oropharynx as per pre-operative assessment  Difficulty Due To: Difficulty was anticipated, Difficult Airway- due to anterior larynx and Difficult Airway- due to dentition

## 2019-11-01 NOTE — Op Note (Signed)
11/01/2019  11:43 AM  PATIENT:  Cameron Arroyo  68 y.o. male  PRE-OPERATIVE DIAGNOSIS: Left L4-5 foraminal/extraforaminal lumbar disc herniation; left lumbar radiculopathy; lumbar spondylosis; lumbar degenerative disc disease  POST-OPERATIVE DIAGNOSIS:  Left L4-5 foraminal/extraforaminal lumbar disc herniation; left lumbar radiculopathy; lumbar spondylosis; lumbar degenerative disc disease  PROCEDURE:  Procedure(s):  L4-5 lumbar decompression including bilateral laminectomy, facetectomy, and foraminotomies with bilateral discectomy, with decompression of bilateral neuroforaminal stenosis with decompression beyond that required for interbody arthrodesis; bilateral L4-5 posterior lumbar interbody arthrodesis with Tritanium interbody implants, Vitoss BA with bone marrow aspirate, and infuse; bilateral L4-5 posterior lateral arthrodesis with nonsegmental radius posterior instrumentation, Vitoss BA with bone marrow aspirate, and infuse  SURGEON: Jovita Gamma, MD  ASSISTANTS: Ashok Pall, MD  ANESTHESIA:   general  EBL:  Total I/O In: 1000 [I.V.:1000] Out: 1150 [Urine:950; Blood:200]  BLOOD ADMINISTERED:none  CELL SAVER GIVEN: Cell Saver technician felt that there was insufficient recovered blood to return any to the patient.  COUNT: Correct per nursing staff  DICTATION: Patient is brought to the operating room placed under general endotracheal anesthesia. The patient was turned to prone position the lumbar region was prepped with Betadine soap and solution and draped in a sterile fashion. The midline was infiltrated with local anesthesia with epinephrine. A localizing x-ray was taken and then a midline incision was made carried down through the subcutaneous tissue, bipolar cautery and electrocautery were used to maintain hemostasis. Dissection was carried down to the lumbar fascia. The fascia was incised bilaterally and the paraspinal muscles were dissected from the spinous processes and  lamina in a subperiosteal fashion. Another x-ray was taken for localization and the L4-5 level was localized. Dissection was then carried out laterally over the facet complex and the transverse processes of L4 and L5 were exposed and decorticated.   The L4-5 facets were noted to be markedly hypertrophic bilaterally.  Decompression was begun with bilateral L4 and L5 laminectomies using the high-speed drill and Kerrison punches.  Decompression was extended laterally with bilateral L4-5 facetectomies and foraminotomies with decompression of the stenotic compression of the exiting L4 and L5 nerve roots laterally.  The large left L4-5 foraminal/extraforaminal disc herniation was identified.  Discectomy was begun bilaterally at L4-5 with incision of the annulus and continue with microcurettes and pituitary rongeurs.  The subligamentous disc herniation was mobilized and removed, decompressing the exiting left L4 and L5 nerve roots.  A thorough discectomy was performed.  Once the decompression stenotic compression of the thecal sac and exiting nerve roots was completed, we proceeded with the posterior lumbar interbody arthrodesis.  Once the discectomy was completed we began to prepare the endplate surfaces removing the cartilaginous endplates surface. We then measured the height of the intervertebral disc space. We selected 12 x 23 x 6 x 9 Tritanium interbody implants.  The C-arm fluoroscope was then draped and brought in the field and we identified the pedicle entry points bilaterally at the L4 and L5 levels. Each of the 4 pedicles was probed, we aspirated bone marrow aspirate from the vertebral bodies, this was injected over two 10 cc strips of Vitoss BA. Then each of the pedicles was examined with the ball probe, good bony surfaces were found and no bony cuts were found. Each of the pedicles was then tapped with a 5.25 mm tap, again examined with the ball probe good threading was found and no bony cuts were found.  We then placed 5.75 by 45 millimeter screws bilaterally at each level.  We then packed the interbody implants with Vitoss BA with bone marrow aspirate and infuse, and then placed the first implant on the right side, carefully retracting the thecal sac and nerve root medially. We then went back to the left side and packed the midline with additional Vitoss BA with bone marrow aspirate and infuse, and then placed a second implant on the left side again retracting the thecal sac and nerve root medially. Additional Vitoss BA with bone marrow aspirate and infuse was packed lateral to the implants.  We then packed the lateral gutter over the transverse processes and intertransverse space with Vitoss BA with bone marrow aspirate and infuse. We then selected prelordosed rods.  We used a 35 mm rod on the left and a 40 mm rod on the right.  They were placed within the screw heads and secured with locking caps.  Once all 4 locking caps were placed final tightening was performed against a counter torque.  The wound had been irrigated multiple times during the procedure with saline solution and bacitracin solution, good hemostasis was established with a combination of bipolar cautery and Gelfoam with thrombin. Once good hemostasis was confirmed we proceeded with closure.  Paraspinal muscles deep fascia and Scarpa's fascia were closed with interrupted undyed 1 Vicryl sutures.  The subcutaneous and subcuticular layer was closed with interrupted inverted 2-0 undyed Vicryl sutures the skin edges were approximated with Dermabond.  The wound was dressed with sterile gauze and Hypafix.  Following surgery the patient was turned back to the supine position to be reversed and the anesthetic extubated and transferred to the recovery room for further care.  PLAN OF CARE: Admit to inpatient   PATIENT DISPOSITION:  PACU - hemodynamically stable.   Delay start of Pharmacological VTE agent (>24hrs) due to surgical blood loss or  risk of bleeding:  yes

## 2019-11-02 LAB — GLUCOSE, CAPILLARY
Glucose-Capillary: 114 mg/dL — ABNORMAL HIGH (ref 70–99)
Glucose-Capillary: 127 mg/dL — ABNORMAL HIGH (ref 70–99)

## 2019-11-02 MED ORDER — METFORMIN HCL 500 MG PO TABS: 1000.0000 mg | ORAL_TABLET | Freq: Two times a day (BID) | ORAL | Status: DC

## 2019-11-02 MED ORDER — HYDROCODONE-ACETAMINOPHEN 5-325 MG PO TABS: 1.0000 | ORAL_TABLET | ORAL | 0 refills | Status: DC | PRN

## 2019-11-02 NOTE — Discharge Summary (Signed)
Physician Discharge Summary  Patient ID: Cameron Arroyo MRN: [id number] DOB/AGE: [birth year] 68 y.o.  Admit date: 11/01/2019 Discharge date: 11/02/2019  Admission Diagnoses:  Left L4-5 foraminal/extraforaminal lumbar disc herniation; left lumbar radiculopathy; lumbar spondylosis; lumbar degenerative disc disease  Discharge Diagnoses:  Left L4-5 foraminal/extraforaminal lumbar disc herniation; left lumbar radiculopathy; lumbar spondylosis; lumbar degenerative disc disease Active Problems:   Lumbar disc herniation   Discharged Condition: good  Hospital Course: Patient was admitted, underwent a bilateral L4-5 lumbar decompression, PLIF, and PLA.  He has done well following surgery.  He is up and ambulating actively.  He is voiding well.  His wound is clean and dry; there is no ecchymosis, erythema, swelling, or drainage.  He and his wife have been given instructions regarding wound care and activities.  He is to continue to hold his Eliquis until Wednesday, December 9, and if at that point his wound is healing well and he is doing well clinically, he is to resume his Eliquis.  If he has any concerns regarding his condition he is to hold his Eliquis and contact me at the office for evaluation.  He is scheduled for his routine postoperative visit in about 3 weeks with x-ray.  Discharge Exam: Blood pressure (!) 86/50, pulse 64, temperature 97.7 F (36.5 C), resp. rate 18, height 6' 2.5" (1.892 m), weight 86.6 kg, SpO2 100 %.  Disposition: Discharge disposition: 01-Home or Self Care       Discharge Instructions    Discharge wound care:   Complete by: As directed    Leave the wound open to air. Shower daily with the wound uncovered. Water and soapy water should run over the incision area. Do not wash directly on the incision for 2 weeks. Remove the glue after 2 weeks.   Driving Restrictions   Complete by: As directed    No driving for 2 weeks. May ride in the car locally now. May begin to  drive locally in 2 weeks.   Other Restrictions   Complete by: As directed    Walk gradually increasing distances out in the fresh air at least twice a day. Walking additional 6 times inside the house, gradually increasing distances, daily. No bending, lifting, or twisting. Perform activities between shoulder and waist height (that is at counter height when standing or table height when sitting).     Allergies as of 11/02/2019   No Known Allergies     Medication List    TAKE these medications   apixaban 5 MG Tabs tablet Commonly known as: ELIQUIS Take 1 tablet (5 mg total) by mouth 2 (two) times daily.   atorvastatin 10 MG tablet Commonly known as: LIPITOR Take 1 tablet (10 mg total) by mouth daily.   gabapentin 300 MG capsule Commonly known as: NEURONTIN Take 300 mg by mouth daily as needed (pain).   HYDROcodone-acetaminophen 5-325 MG tablet Commonly known as: NORCO/VICODIN Take 1-2 tablets by mouth every 4 (four) hours as needed (pain).   metFORMIN 1000 MG tablet Commonly known as: GLUCOPHAGE Take 1 tablet (1,000 mg total) by mouth 2 (two) times daily with a meal.   metoprolol tartrate 25 MG tablet Commonly known as: LOPRESSOR Take 0.5 tablets (12.5 mg total) by mouth 2 (two) times daily.   Tyler Aas FlexTouch 100 UNIT/ML Sopn FlexTouch Pen Generic drug: insulin degludec Inject 0.15 mLs (15 Units total) into the skin at bedtime.            Discharge Care Instructions  (From admission, onward)  Start     Ordered   11/02/19 0000  Discharge wound care:    Comments: Leave the wound open to air. Shower daily with the wound uncovered. Water and soapy water should run over the incision area. Do not wash directly on the incision for 2 weeks. Remove the glue after 2 weeks.   11/02/19 0815         Follow-up Information    Jovita Gamma, MD Follow up.   Specialty: Neurosurgery Contact information: [address] [place]  [address] [phone]           Signed: Hosie Spangle 11/02/2019, 8:16 AM

## 2019-11-02 NOTE — Discharge Instructions (Signed)
°  Call Your Doctor If Any of These Occur °Redness, drainage, or swelling at the wound.  °Temperature greater than 101 degrees. °Severe pain not relieved by pain medication. °Incision starts to come apart. °Follow Up Appt °Call today for appointment in 3 weeks ([phone]) or for problems.  If you have any hardware placed in your spine, you will need an x-ray before your appointment. °

## 2019-11-02 NOTE — Plan of Care (Signed)
Patient alert and oriented, mae's well, voiding adequate amount of urine, swallowing without difficulty, no c/o pain at time of discharge. Patient discharged home with family. Script and discharged instructions given to patient. Patient and family stated understanding of instructions given. Patient has an appointment with Dr. Nudelman 

## 2019-11-06 MED FILL — Heparin Sodium (Porcine) Inj 1000 Unit/ML: INTRAMUSCULAR | Qty: 30 | Status: AC

## 2019-11-06 MED FILL — Sodium Chloride IV Soln 0.9%: INTRAVENOUS | Qty: 2000 | Status: AC

## 2019-11-12 NOTE — Progress Notes (Signed)
Subjective:    Patient ID: Cameron Arroyo, male    DOB: 25-Oct-1951, 68 y.o.   MRN: [id number]  68 y.o.M post hosp f/u and to establish PCP When I entered the room this patient was extremely angry and I had to spend at least 10 minutes of the visit initially de-escalating the patient's anger.  The patient was accompanied by his spouse who also was very frustrated.  Their statement is that when the patient was admitted from the 21st and 24 September due to the COVID restrictions the spouse could not visit and the patient felt that he was getting conflicting information between various providers who saw him during his short visit.  Also the patient was to receive a post hospital visit with cardiology but that appointment did not appear to be made as of yet. The patient had a former primary care provider at the [facility] site however is wishing to establish care.  Has not been seen by primary care provider in 6 years.  During the hospitalization the patient was found to have new onset diabetes with hemoglobin A1c of 11.5.  He was to go home on Lantus but his insurance would not cover this so he switched to NPH 15 units in the morning and 5 units in the evening.  He is also on 500 mg once daily of metformin.  The patient brings a very complete record of all of his weights blood pressures heart rate and blood sugars.  His blood sugars appear to range anywhere from a low of 1 10-1 20 to a high of 1 70-1 80.  His morning sugars tend to run lower his afternoon sugars tend to run higher.  The patient's weight has gone down he did suffer significant weight loss in the past.  He is 189 pounds today and note his CBG today is 172  The patient did have polyuria and polydipsia this is improved somewhat but is still occurring.  The patient was admitted he had atrial fibrillation atrial flutter with rapid ventricular response and was hypotensive secondary to hypovolemia with associated hypermagnesemia  As the  patient's electrolytes and fluids were replaced and replenished the patient improved.  Once again he was sent home on the NPH and metformin as noted above.  The patient was also cardioverted during this hospitalization and placed on low-dose metoprolol along with Eliquis twice daily  Below is a copy of the discharge summary  Patient: Cameron Arroyo                                                         Admit date: 08/20/2019   DOB: [birth year]                                                              Discharge date:08/23/2019/11:20 AM LHT:[id number]  Discharge Diagnoses:   Principal Problem:   New onset type 2 diabetes mellitus ([place]) Active Problems:   Atrial flutter with rapid ventricular response (HCC)   Leukocytosis   Unintentional weight loss   Polyuria   Hypotension due to hypovolemia   Diabetes mellitus due to underlying condition, uncontrolled, with hyperglycemia ([place])   History of Present Illness/ Hospital Course Cameron Arroyo Summary: Cameron Jenkins Jonesis a 68 y.o.malewithno significant past medical history came to the emergency department with multiple problems. He reports that he feels like crap, polyuria, dry mouth, decreased appetite, weight loss, shortness of breath, anxiety, no energy since 2 to 3 weeks. He went to [facility] health urgent care initially where he was found to have atrial flutter with rapid response and was advised to go to the emergency department for further evaluation and management. He reports shortness of breath but denies association with chest pain, palpitation, headache, blurry vision, lightheadedness, dizziness, nausea, vomiting, epigastric pain, diarrhea, constipation, fever, chills, decreased appetite, night sweats, family history of thyroid issues, colon cancer, hematemesis, melena,  over-the-counter use of NSAIDs. Reports unintentional weight loss of 30 pounds in the last 1 year. He is a former smoker however denies alcohol, illicit drug use. He was prescribed steroids by his PCP recently for left knee pain. Patient reports that his left knee pain is better.   ED Course:Patient was found to have rapid narrow complex tachycardia suggestive of atrial flutter.Hyperglycemia of blood glucose 430, and leukocytosis of 12.7. Initial troponin x1-. Magnesium level elevated at 2.5. Chest x-ray came back negative. COVID-19: Pending. CBC: H&H is stable. Kidney function: WNL.[name] Kitchen Started on IV diltiazem and IV heparin.Transthoracic echo was ordered which was not performed due to patient's tachycardia.  Briefly patient was admitted with atrial flutter, status post TEE cardioversion which was successful on 08/22/2019 currently in normal sinus rhythm.  Per cardiology patient started on metoprolol and Eliquis.  Patient was noted from new onset diabetes mellitus with a hemoglobin A1c of 11.5.  Patient started on Lantus and metformin.  He was educated on diabetes, diet, instructed to check his blood sugars 3 times daily-4 times daily, keep a log for adjustment and titration of the medication for better glycemic control.  Patient was deemed stable was discharged home.   Discharge summary/Assessment & Plan:   Hyperglycemia/ newly diagnosed diabetesmellitus, POA: -Blood sugar more than 400 upon arrival -A1C 11.5 -Patient presented with polyuria, dry mouth, weight loss, fatigue -will start Lantus 20u / day and SSI/started on metformin 500 mg -Patient was given glucometer, instructed to check blood sugars 3 times daily-4 times daily, keep close log... Follow with PCP with adjustment of medication accordingly.  -Status post volume repletion -consult diabetes team for education; dietary following  Atrial flutter, asymptomatic, POA: -Patientasymptomatic regarding  arrhythmia -Patient heart rates more than 150s - trop neg and CXR negative - Thyroid function studyWNL - seen by cardiology, on IV heparin and IV diltiazem now- pendingTEE/cardioversionOn  08/22/19 -Cardioversion was successful, patient is currently in normal sinus rhythm -Per cardiology patient was started on metoprolol and Eliquis prescription were provided  Leukocytosis,reactive, resolved: -Likely reactive given recent steroid use, A. fib/flutter and hyperglycemia -Resolved no signs of infection.  Unintentional weight loss: -Reported loss of30 pounds in 1 year. -Likely in the setting of poorly controlled diabetes with A1c of 11.5 -Patient was instructed to keep an close log of his weight changes.   Code Status:Partial, DO NOT INTUBATE Family Communication:None present Disposition Plan: Patient will be discharged home, with close follow with  PCP and cardiologist in 1 to 2 weeks.  Consultants:  Cardiology  Procedures:  Status post tentative cardioversion on 08/22/2019 currently on normal sinus rhythm    Note the patient has also recently been seen by sports medicine physician in the [facility] here in [place] who is referring him to Dr. Sherwood Gambler of neurosurgery along with an MRI because of severe low back pain.  Note the note the neurosurgeon had done cervical spine surgery in the past.  Below is the office note and plan from that visit Low back pain lumbar issue saw sports medicine 09/11/19: Assessment Note I had a long discussion with the patient. He is quite miserable today. I do believe this is all emanating from his lumbar spine. He is now greater than 6 weeks into conservative care and not improving at all. The prednisone did not help and he reports may have made him worse. I will place him on to gabapentin and titrate up slowly as tolerated. He will monitor for sedation. We will obtain an MRI scan of his lumbar spine. He would like to be  referred to Dr. Sherwood Gambler for further treatment. I will go and see if I can make the arrangements for him. He is quite frustrated with his pain and would like to see all of his options. All questions were encouraged and answered.   1. Low back pain . XR, lumbar spine  . MRI, lumbar spine, w/o contrast - mri lumbar spine r/o hnp  . magnetic resonance imaging (MRI): about this test . Cameron Arroyo . neurosurgery referral  2. Pain in left knee 3. Lumbar spondylosis with myelopathy . gabapentin 300 mg capsule    The patient also shared with me his diet plan and all of his meals are carefully written down by the spouse.  Note that he is eating a lot of potatoes and grits with his meals.  Note also the patient's PHQ 9 is extremely high during this visit and it is clear that he is experiencing some degree of frustration with healthcare system and has been experiencing quite a bit of anxiety and depression.   10/02/2019 This patient returns in follow-up for diabetes, atrial flutter, hyperlipidemia, lumbar iSpondylosis with myelopathy. At the last visit we obtained a lipid panel showing an LDL of 100 and elected to begin atorvastatin 10 mg daily.  The patient is yet to pick this up because when he was on a lipid medication 7 years ago he paid $3 a pill.  I explained to the patient this is now generic medicine and should not of been that expensive.  He wants to discuss the need for this with his cardiologist and his appointment which she has tomorrow morning.  The patient states he has been doing better with his blood glucoses and displays a very nice log.  His blood sugars run typically fasting 100-120 and postprandial and late in the afternoon will run as high as 170 but more recently with increases in his tresbia have been running closer to the 120-130 range The patient did decline at this visit a flu vaccine and pneumonia vaccine  Patient did go to podiatry and had his toenails  debrided and had a normal foot exam  The patient is seeing physical therapy for his chronic low back pain and as well has upcoming appointments with neurosurgery  The patient's question is can he come off the Eliquis should he require a surgical procedure on his lumbar spine I asked him  to discuss that with his cardiologist     12/15: Since the last visit the patient states his blood sugars have been in improved range and he brings a diary with him showing glucoses in the 100-120 range morning and evening.  There are no postprandial glucoses and today his glucose was 211 however this was after eating a bowl of grits before arriving to the clinic.  Blood pressures have been in great control and align with a blood pressure reading today 118/72.  Pulse rates been as low as 47 but in general was in the 50s to mid 60s on the atenolol.  Note hemoglobin A1c was 6.7 is a preop value before lumbar surgery.  He successfully underwent lumbar surgery per neurosurgery and is recovering from this.  He is not yet in physical therapy but has an upcoming post hospital appointment with the neurosurgeon.  He had been no episodes of palpitations at this time noted.  He states his fatigue is improved and his urination is improved as well.  He continues to be concerned about the cost of his a atorvastatin.  He is concerned when his deductible runs out his cost will be increased.  The patient does say his fatigue is better.  His lower back pain is improved from the surgery.  He has minimal edema in the ankles.  He is compliant with his insulin and Metformin program.  He has been monitoring his sugars.   Past Medical History:  Diagnosis Date  . Arthritis   . Asthma    as child  . Atrial flutter with rapid ventricular response ([place]) 08/20/2019  . Lumbar disc herniation 11/01/2019  . New onset type 2 diabetes mellitus ([place]) 08/20/2019  . Pneumonia    history of     Family History  Problem Relation Age of Onset  .  Cirrhosis Mother   . Cancer Father      Social History   Socioeconomic History  . Marital status: Married    Spouse name: Not on file  . Number of children: Not on file  . Years of education: Not on file  . Highest education level: Not on file  Occupational History  . Not on file  Tobacco Use  . Smoking status: Former Smoker    Quit date: 11/29/1992    Years since quitting: 26.9  . Smokeless tobacco: Never Used  Substance and Sexual Activity  . Alcohol use: No  . Drug use: Never  . Sexual activity: Not on file  Other Topics Concern  . Not on file  Social History Narrative  . Not on file   Social Determinants of Health   Financial Resource Strain:   . Difficulty of Paying Living Expenses: Not on file  Food Insecurity:   . Worried About Charity fundraiser in the Last Year: Not on file  . Ran Out of Food in the Last Year: Not on file  Transportation Needs:   . Lack of Transportation (Medical): Not on file  . Lack of Transportation (Non-Medical): Not on file  Physical Activity:   . Days of Exercise per Week: Not on file  . Minutes of Exercise per Session: Not on file  Stress:   . Feeling of Stress : Not on file  Social Connections:   . Frequency of Communication with Friends and Family: Not on file  . Frequency of Social Gatherings with Friends and Family: Not on file  . Attends Religious Services: Not on file  . Active Member  of Clubs or Organizations: Not on file  . Attends Archivist Meetings: Not on file  . Marital Status: Not on file  Intimate Partner Violence:   . Fear of Current or Ex-Partner: Not on file  . Emotionally Abused: Not on file  . Physically Abused: Not on file  . Sexually Abused: Not on file     No Known Allergies   Outpatient Medications Prior to Visit  Medication Sig Dispense Refill  . apixaban (ELIQUIS) 5 MG TABS tablet Take 1 tablet (5 mg total) by mouth 2 (two) times daily. 180 tablet 1  . atorvastatin (LIPITOR) 10 MG tablet  Take 1 tablet (10 mg total) by mouth daily. 90 tablet 0  . insulin degludec (TRESIBA FLEXTOUCH) 100 UNIT/ML SOPN FlexTouch Pen Inject 0.15 mLs (15 Units total) into the skin at bedtime. 15 mL 0  . metFORMIN (GLUCOPHAGE) 1000 MG tablet Take 1 tablet (1,000 mg total) by mouth 2 (two) times daily with a meal. 180 tablet 0  . metoprolol tartrate (LOPRESSOR) 25 MG tablet Take 0.5 tablets (12.5 mg total) by mouth 2 (two) times daily. 90 tablet 0  . gabapentin (NEURONTIN) 300 MG capsule Take 300 mg by mouth daily as needed (pain).     [name] Kitchen HYDROcodone-acetaminophen (NORCO/VICODIN) 5-325 MG tablet Take 1-2 tablets by mouth every 4 (four) hours as needed (pain). (Patient not taking: Reported on 11/13/2019) 40 tablet 0   No facility-administered medications prior to visit.      Review of Systems  Pos in BOLD Constitutional:     weight loss, night sweats,  Fevers, chills, fatigue, lassitude. HEENT:   No headaches,  Difficulty swallowing,  Tooth/dental problems,  Sore throat,                No sneezing, itching, ear ache, nasal congestion, post nasal drip,   CV:  No chest pain,  Orthopnea, PND, swelling in lower extremities, anasarca, dizziness, palpitations  GI  No heartburn, indigestion, abdominal pain, nausea, vomiting, diarrhea, change in bowel habits, loss of appetite  Resp: No shortness of breath with exertion or at rest.  No excess mucus, no productive cough,  No non-productive cough,  No coughing up of blood.  No change in color of mucus.  No wheezing.  No chest wall deformity  Skin: no rash or lesions.  GU: no dysuria, change in color of urine, no urgency or frequency.  No flank pain. polyuria  MS:  No joint pain or swelling.  No decreased range of motion.   back pain.    Psych:   change in mood or affect.  depression or anxiety.  No memory loss.     Objective:   Physical Exam Vitals:   11/13/19 0923  BP: 118/72  Pulse: 65  Resp: 16  SpO2: 95%  Weight: 193 lb 9.6 oz (87.8 kg)     Wt Readings from Last 3 Encounters:  11/13/19 193 lb 9.6 oz (87.8 kg)  11/01/19 191 lb (86.6 kg)  10/30/19 191 lb 1 oz (86.7 kg)    Gen: The patient is in much better spirits today and is in no acute distress weight has stabilized  ENT: No lesions,  mouth clear,  oropharynx clear, no postnasal drip Severe periodontal disease and carious teeth  Neck: No JVD, no TMG, no carotid bruits  Lungs: No use of accessory muscles, no dullness to percussion, clear without rales or rhonchi  Cardiovascular: RRR, heart sounds normal, no murmur or gallops, no peripheral edema  Abdomen:  soft and NT, no HSM,  BS normal  Musculoskeletal: No deformities, no cyanosis or clubbing  Neuro: alert, non focal  Skin: Warm, no lesions or rashes  ECG 09/11/19:   Sinus bradycardia,  PACs otherwise normal   ECG 11/3:  Sinus brady.   CHA2DS2/VAS Stroke Risk Points  Current as of 2 hours ago     2 >= 2 Points: High Risk  1 - 1.99 Points: Medium Risk  0 Points: Low Risk    The patient's score has not changed in the past year.: No Change     Details    This score determines the patient's risk of having a stroke if the  patient has atrial fibrillation.       Points Metrics  0 Has Congestive Heart Failure:  No    Current as of 2 hours ago  0 Has Vascular Disease:  No    Current as of 2 hours ago  0 Has Hypertension:  No    Current as of 2 hours ago  1 Age:  77    Current as of 2 hours ago  1 Has Diabetes:  Yes    Current as of 2 hours ago  0 Had Stroke:  No  Had TIA:  No  Had thromboembolism:  No    Current as of 2 hours ago  0 Male:  No    Current as of 2 hours ago    CXR 9/21: NAD  TEE: FINDINGS:  LEFT VENTRICLE: EF = 50-55%  RIGHT VENTRICLE: Normal size and function.   LEFT ATRIUM: No thrombus/mass.  LEFT ATRIAL APPENDAGE: No thrombus/mass.   RIGHT ATRIUM: No thrombus/mass.  AORTIC VALVE:  Trileaflet. No regurgitation.   MITRAL VALVE:    Normal structure. Mild  regurgitation.   TRICUSPID VALVE: Normal structure. No regurgitation.   PULMONIC VALVE: Grossly normal structure. No regurgitation. No apparent vegetation.  INTERATRIAL SEPTUM: No PFO or ASD seen by color Doppler.  PERICARDIUM: No effusion noted.  DESCENDING AORTA: Mild plaque    CARDIOVERSION:     Indications:  Symptomatic Atrial Flutter  Procedure Details:  Once the TEE was complete, the patient had the defibrillator pads placed in the anterior and posterior position. Once an appropriate level of sedation was confirmed, the patient was cardioverted x 1 with 100J of biphasic synchronized energy.  The patient converted to NSR.  There were no apparent complications.  The patient had normal neuro status and respiratory status post procedure with vitals stable as recorded elsewhere.  Adequate airway was maintained throughout and vital signs monitored per protocol.   Lab Results  Component Value Date   HGBA1C 6.7 (H) 10/30/2019   BMP Latest Ref Rng & Units 10/30/2019 09/11/2019 08/23/2019  Glucose 70 - 99 mg/dL 132(H) 144(H) 143(H)  BUN 8 - 23 mg/dL '17 14 17  '$ Creatinine 0.61 - 1.24 mg/dL 0.83 0.88 0.86  BUN/Creat Ratio 10 - 24 - 16 -  Sodium 135 - 145 mmol/L 141 141 137  Potassium 3.5 - 5.1 mmol/L 4.1 4.2 3.6  Chloride 98 - 111 mmol/L 105 106 106  CO2 22 - 32 mmol/L '23 22 24  '$ Calcium 8.9 - 10.3 mg/dL 9.1 9.2 8.4(L)   Hepatic Function Latest Ref Rng & Units 09/11/2019 08/21/2019  Total Protein 6.0 - 8.5 g/dL 6.1 5.2(L)  Albumin 3.8 - 4.8 g/dL 4.4 3.0(L)  AST 0 - 40 IU/L 17 15  ALT 0 - 44 IU/L 26 21  Alk Phosphatase 39 - 117 IU/L 65  66  Total Bilirubin 0.0 - 1.2 mg/dL 0.4 1.3(H)   CBC Latest Ref Rng & Units 10/30/2019 08/23/2019 08/22/2019  WBC 4.0 - 10.5 K/uL 5.8 8.3 7.3  Hemoglobin 13.0 - 17.0 g/dL 14.3 13.4 13.3  Hematocrit 39.0 - 52.0 % 43.9 37.7(L) 39.1  Platelets 150 - 400 K/uL 213 115(L) 131(L)   Lipid Panel     Component Value Date/Time   CHOL 168  09/11/2019 1145   TRIG 153 (H) 09/11/2019 1145   HDL 41 09/11/2019 1145   CHOLHDL 4.1 09/11/2019 1145   LDLCALC 100 (H) 09/11/2019 1145   LABVLDL 27 09/11/2019 1145    Hep C neg Microalbumin Normal      Assessment & Plan:  I personally reviewed all images and lab data in the [facility] system as well as any outside material available during this office visit and agree with the  radiology impressions.   PAF (paroxysmal atrial fibrillation) (HCC) The patient has history of atrial fibrillation and flutter paroxysmal he is now in sinus bradycardia on 25 mg of atenolol daily with still some fatigue and episodes where his heart rate gets into the 40s per his diary  Blood pressures under excellent control and he does maintain the Eliquis  I would reduce his atenolol to 12.5 mg daily and maintain Eliquis he and he is going to have further discussion with cardiology on the necessity of the Eliquis going forward.  I mentioned to the patient that his heart risk score with regards to atrial fibrillation is still elevated because of his diabetes and age and he would benefit from the anticoagulation should he go back in atrial fibrillation unexpectedly  Hyperlipidemia associated with type 2 diabetes mellitus ([place]) Hyperlipidemia associated with type 2 diabetes  The barrier to staying on atorvastatin has been cost for this patient and the co-pay.  We are attempting to get this patient pharmacy support for this on his next prescription fill and will have this at our pharmacy  Type 2 diabetes mellitus, controlled ([place]) Type 2 diabetes now controlled with long-term use of insulin and Metformin  Plan to be to continue the [place] at 15 units daily and Metformin at 1000 mg twice daily  Lumbar spondylosis with myelopathy The patient is improved status post lumbar discectomy  Per neurosurgery   Sherril was seen today for follow-up.  Diagnoses and all orders for this visit:  Controlled type 2 diabetes  mellitus with hyperglycemia, with long-term current use of insulin (HCC) -     Glucose (CBG)  PAF (paroxysmal atrial fibrillation) (HCC)  Hyperlipidemia associated with type 2 diabetes mellitus (HCC)  Chronic anticoagulation  Lumbar spondylosis with myelopathy  Other orders -     metoprolol tartrate (LOPRESSOR) 25 MG tablet; Take 0.5 tablets (12.5 mg total) by mouth daily.

## 2019-11-13 ENCOUNTER — Other Ambulatory Visit: Payer: Self-pay

## 2019-11-13 ENCOUNTER — Ambulatory Visit: Payer: 59 | Attending: Critical Care Medicine | Admitting: Critical Care Medicine

## 2019-11-13 ENCOUNTER — Encounter: Payer: Self-pay | Admitting: Critical Care Medicine

## 2019-11-13 VITALS — BP 118/72 | HR 65 | Resp 16 | Wt 193.6 lb

## 2019-11-13 DIAGNOSIS — E1165 Type 2 diabetes mellitus with hyperglycemia: Secondary | ICD-10-CM

## 2019-11-13 DIAGNOSIS — M4716 Other spondylosis with myelopathy, lumbar region: Secondary | ICD-10-CM

## 2019-11-13 DIAGNOSIS — I480 Paroxysmal atrial fibrillation: Secondary | ICD-10-CM

## 2019-11-13 DIAGNOSIS — Z794 Long term (current) use of insulin: Secondary | ICD-10-CM | POA: Diagnosis not present

## 2019-11-13 DIAGNOSIS — E1169 Type 2 diabetes mellitus with other specified complication: Secondary | ICD-10-CM

## 2019-11-13 DIAGNOSIS — E785 Hyperlipidemia, unspecified: Secondary | ICD-10-CM | POA: Diagnosis not present

## 2019-11-13 DIAGNOSIS — Z7901 Long term (current) use of anticoagulants: Secondary | ICD-10-CM

## 2019-11-13 LAB — GLUCOSE, POCT (MANUAL RESULT ENTRY): POC Glucose: 211 mg/dl — AB (ref 70–99)

## 2019-11-13 MED ORDER — METOPROLOL TARTRATE 25 MG PO TABS: 12.5000 mg | ORAL_TABLET | Freq: Every day | ORAL | 0 refills | Status: DC

## 2019-11-13 NOTE — Assessment & Plan Note (Signed)
The patient has history of atrial fibrillation and flutter paroxysmal he is now in sinus bradycardia on 25 mg of atenolol daily with still some fatigue and episodes where his heart rate gets into the 40s per his diary  Blood pressures under excellent control and he does maintain the Eliquis  I would reduce his atenolol to 12.5 mg daily and maintain Eliquis he and he is going to have further discussion with cardiology on the necessity of the Eliquis going forward.  I mentioned to the patient that his heart risk score with regards to atrial fibrillation is still elevated because of his diabetes and age and he would benefit from the anticoagulation should he go back in atrial fibrillation unexpectedly

## 2019-11-13 NOTE — Patient Instructions (Signed)
Atenolol to 1/2 tablet daily  Discuss with Dr. Debara Pickett your dosing of Eliquis and ongoing use of atenolol  We will check to see if we can get you better pricing on atorvastatin from our pharmacy perhaps patient assistance as well  No change in your Tresiba dosing and metformin  Return in follow-up 2 months

## 2019-11-13 NOTE — Assessment & Plan Note (Signed)
The patient is improved status post lumbar discectomy  Per neurosurgery

## 2019-11-13 NOTE — Assessment & Plan Note (Addendum)
Hyperlipidemia associated with type 2 diabetes  The barrier to staying on atorvastatin has been cost for this patient and the co-pay.  We are attempting to get this patient pharmacy support for this on his next prescription fill and will have this at our pharmacy

## 2019-11-13 NOTE — Assessment & Plan Note (Signed)
Type 2 diabetes now controlled with long-term use of insulin and Metformin  Plan to be to continue the [place] at 15 units daily and Metformin at 1000 mg twice daily

## 2019-11-21 DIAGNOSIS — Z981 Arthrodesis status: Secondary | ICD-10-CM | POA: Insufficient documentation

## 2019-11-27 ENCOUNTER — Encounter: Payer: Self-pay | Admitting: Internal Medicine

## 2019-11-27 ENCOUNTER — Ambulatory Visit (INDEPENDENT_AMBULATORY_CARE_PROVIDER_SITE_OTHER): Payer: 59 | Admitting: Internal Medicine

## 2019-11-27 ENCOUNTER — Other Ambulatory Visit: Payer: Self-pay

## 2019-11-27 VITALS — BP 114/66 | HR 71 | Temp 97.2°F | Ht 74.5 in | Wt 192.8 lb

## 2019-11-27 DIAGNOSIS — Z7901 Long term (current) use of anticoagulants: Secondary | ICD-10-CM

## 2019-11-27 DIAGNOSIS — I483 Typical atrial flutter: Secondary | ICD-10-CM

## 2019-11-27 NOTE — Patient Instructions (Signed)
Medication Instructions:  NO CHANGES *If you need a refill on your cardiac medications before your next appointment, please call your pharmacy*  Lab Work: NONE NEEDED If you have labs (blood work) drawn today and your tests are completely normal, you will receive your results only by: [name] Kitchen MyChart Message (if you have MyChart) OR . A paper copy in the mail If you have any lab test that is abnormal or we need to change your treatment, we will call you to review the results.  Testing/Procedures: NONE NEEDED  Follow-Up: At [facility], you and your health needs are our priority.  As part of our continuing mission to provide you with exceptional heart care, we have created designated Provider Care Teams.  These Care Teams include your primary Cardiologist (physician) and Advanced Practice Providers (APPs -  Physician Assistants and Nurse Practitioners) who all work together to provide you with the care you need, when you need it.  Your next appointment:   1 year(s)  The format for your next appointment:   In Person  Provider:   K. Mali Hilty, MD  Other Instructions Follow up with Dr. Debara Pickett only

## 2019-11-27 NOTE — Progress Notes (Signed)
OFFICE NOTE  Chief Complaint:  Follow-up atrial flutter  Primary Care Physician: Elsie Stain, MD  HPI:  Cameron Arroyo is a 68 y.o. male with a past medial history significant for new onset atrial flutter with rapid ventricular response in September 2020.  At the time was also found to be in severe hyperglycemia.  He had a new diagnosis of type 2 diabetes.  Hemoglobin A1c was 11.5 but subsequently after treatment has come down to 6.7.  He did undergo successful back surgery.  He is now in a brace which will have to wear for several months.  He denies any recurrent atrial flutter, palpitations, chest pain or other associated symptoms.  He is anticoagulated on Eliquis.  Blood pressure is at goal today.  We discussed the possibility of flutter ablation however he wants to consider this further.  PMHx:  Past Medical History:  Diagnosis Date  . Arthritis   . Asthma    as child  . Atrial flutter with rapid ventricular response ([place]) 08/20/2019  . Lumbar disc herniation 11/01/2019  . New onset type 2 diabetes mellitus ([place]) 08/20/2019  . Pneumonia    history of    Past Surgical History:  Procedure Laterality Date  . ANTERIOR CERVICAL DECOMP/DISCECTOMY FUSION N/A 03/01/2013   Procedure: Cervical Four-Five Cervical Five-Six Cervical Six-Seven anterior cervical decompression with fusion plating and bonegraft;  Surgeon: Hosie Spangle, MD;  Location: [facility];  Service: Neurosurgery;  Laterality: N/A;  ANTERIOR CERVICAL DECOMPRESSION/DISCECTOMY FUSION 3 LEVELS  . CARDIOVERSION N/A 08/22/2019   Procedure: CARDIOVERSION;  Surgeon: Donato Heinz, MD;  Location[facility];  Service: Endoscopy;  Laterality: N/A;  . TEE WITHOUT CARDIOVERSION N/A 08/22/2019   Procedure: TRANSESOPHAGEAL ECHOCARDIOGRAM (TEE);  Surgeon: Donato Heinz, MD;  Location: [facility] ENDOSCOPY;  Service: Endoscopy;  Laterality: N/A;    FAMHx:  Family History  Problem Relation Age of Onset  .  Cirrhosis Mother   . Cancer Father     SOCHx:   reports that he quit smoking about 27 years ago. He has never used smokeless tobacco. He reports that he does not drink alcohol or use drugs.  ALLERGIES:  No Known Allergies  ROS: Pertinent items noted in HPI and remainder of comprehensive ROS otherwise negative.  HOME MEDS: Current Outpatient Medications on File Prior to Visit  Medication Sig Dispense Refill  . apixaban (ELIQUIS) 5 MG TABS tablet Take 1 tablet (5 mg total) by mouth 2 (two) times daily. 180 tablet 1  . atorvastatin (LIPITOR) 10 MG tablet Take 1 tablet (10 mg total) by mouth daily. 90 tablet 0  . gabapentin (NEURONTIN) 300 MG capsule Take 300 mg by mouth daily as needed (pain).     [name] Kitchen HYDROcodone-acetaminophen (NORCO/VICODIN) 5-325 MG tablet Take 1-2 tablets by mouth every 4 (four) hours as needed (pain). 40 tablet 0  . insulin degludec (TRESIBA FLEXTOUCH) 100 UNIT/ML SOPN FlexTouch Pen Inject 0.15 mLs (15 Units total) into the skin at bedtime. 15 mL 0  . metFORMIN (GLUCOPHAGE) 1000 MG tablet Take 1 tablet (1,000 mg total) by mouth 2 (two) times daily with a meal. 180 tablet 0  . metoprolol tartrate (LOPRESSOR) 25 MG tablet Take 0.5 tablets (12.5 mg total) by mouth daily. 90 tablet 0   No current facility-administered medications on file prior to visit.    LABS/IMAGING: No results found for this or any previous visit (from the past 48 hour(s)). No results found.  LIPID PANEL:    Component Value  Date/Time   CHOL 168 09/11/2019 1145   TRIG 153 (H) 09/11/2019 1145   HDL 41 09/11/2019 1145   CHOLHDL 4.1 09/11/2019 1145   LDLCALC 100 (H) 09/11/2019 1145     WEIGHTS: Wt Readings from Last 3 Encounters:  11/27/19 192 lb 12.8 oz (87.5 kg)  11/13/19 193 lb 9.6 oz (87.8 kg)  11/01/19 191 lb (86.6 kg)    VITALS: BP 114/66   Pulse 71   Temp (!) 97.2 F (36.2 C)   Ht 6' 2.5" (1.892 m)   Wt 192 lb 12.8 oz (87.5 kg)   SpO2 99%   BMI 24.42 kg/m   EXAM: General  appearance: alert and no distress Neck: no carotid bruit, no JVD and thyroid not enlarged, symmetric, no tenderness/mass/nodules Lungs: clear to auscultation bilaterally Heart: regular rate and rhythm Abdomen: soft, non-tender; bowel sounds normal; no masses,  no organomegaly Extremities: extremities normal, atraumatic, no cyanosis or edema and Back brace in place Pulses: 2+ and symmetric Skin: Skin color, texture, turgor normal. No rashes or lesions Neurologic: Grossly normal Psych: pleasant  EKG: Deferred  ASSESSMENT: 1. Paroxysmal atrial flutter 2. CHA2DS2-VASc score of 2-anticoagulated on Eliquis 3. Recent onset type 2 diabetes (hemoglobin A1c 11.5, improved to 6.7)  PLAN: 1.   Mr. Kucharski had recent paroxysmal atrial flutter with rapid ventricular response.  This appeared to be a typical flutter and may be amenable to catheter ablation.  We discussed that in the absence of this, it is likely he will require lifetime anticoagulation.  He will consider whether he wants to proceed with an ablation or just monitor for any recurrent symptoms.  Plan follow-up with me annually or sooner as necessary.  Pixie Casino, MD, [facility], [place] Director of the Advanced Lipid Disorders &  Cardiovascular Risk Reduction Clinic Diplomate of the American Board of Clinical Lipidology Attending Cardiologist  Direct Dial: [phone]  Fax: [phone]  Website:  [email or web address] 11/27/2019, 9:42 AM

## 2019-12-13 ENCOUNTER — Other Ambulatory Visit: Payer: Self-pay | Admitting: Critical Care Medicine

## 2019-12-13 NOTE — Telephone Encounter (Signed)
Per last note, PCP reduced metoprolol dose to 12.5 daily. Received request to refill 12.5 mg BID. Will send information to PCP for clarification.

## 2019-12-27 ENCOUNTER — Other Ambulatory Visit: Payer: Self-pay | Admitting: Critical Care Medicine

## 2019-12-31 ENCOUNTER — Other Ambulatory Visit: Payer: Self-pay | Admitting: Critical Care Medicine

## 2020-01-16 ENCOUNTER — Ambulatory Visit: Payer: 59 | Admitting: Critical Care Medicine

## 2020-01-22 ENCOUNTER — Other Ambulatory Visit: Payer: Self-pay

## 2020-01-22 ENCOUNTER — Encounter: Payer: Self-pay | Admitting: Critical Care Medicine

## 2020-01-22 ENCOUNTER — Ambulatory Visit: Payer: 59 | Attending: Critical Care Medicine | Admitting: Critical Care Medicine

## 2020-01-22 ENCOUNTER — Encounter: Payer: Self-pay | Admitting: Gastroenterology

## 2020-01-22 VITALS — BP 115/71 | HR 66 | Temp 98.2°F | Resp 18 | Ht 76.0 in | Wt 192.0 lb

## 2020-01-22 DIAGNOSIS — I480 Paroxysmal atrial fibrillation: Secondary | ICD-10-CM

## 2020-01-22 DIAGNOSIS — Z1211 Encounter for screening for malignant neoplasm of colon: Secondary | ICD-10-CM

## 2020-01-22 DIAGNOSIS — E1169 Type 2 diabetes mellitus with other specified complication: Secondary | ICD-10-CM | POA: Diagnosis not present

## 2020-01-22 DIAGNOSIS — K029 Dental caries, unspecified: Secondary | ICD-10-CM

## 2020-01-22 DIAGNOSIS — Z7901 Long term (current) use of anticoagulants: Secondary | ICD-10-CM

## 2020-01-22 DIAGNOSIS — E1165 Type 2 diabetes mellitus with hyperglycemia: Secondary | ICD-10-CM | POA: Diagnosis not present

## 2020-01-22 DIAGNOSIS — H9193 Unspecified hearing loss, bilateral: Secondary | ICD-10-CM

## 2020-01-22 DIAGNOSIS — K056 Periodontal disease, unspecified: Secondary | ICD-10-CM

## 2020-01-22 DIAGNOSIS — Z794 Long term (current) use of insulin: Secondary | ICD-10-CM

## 2020-01-22 DIAGNOSIS — E785 Hyperlipidemia, unspecified: Secondary | ICD-10-CM

## 2020-01-22 NOTE — Patient Instructions (Addendum)
No change in your medications at this time   I will have all your refills on your medicines prepared  You will have to stay on 12 and half milligrams of metoprolol daily and cut the 25 mg in half  A list of dentists to consider will was given to you you do need to have your teeth addressed  Return to see Dr. Joya Gaskins in 4 months

## 2020-01-22 NOTE — Assessment & Plan Note (Signed)
Paroxysmal atrial flutter atrial fibrillation with cardioversion in 2020  Patient is currently in sinus rhythm but because of high risk for stroke will need to remain on Eliquis along with low-dose metoprolol and the patient is comfortable with this plan  The patient does not wish to pursue catheter ablation

## 2020-01-22 NOTE — Assessment & Plan Note (Signed)
Type 2 diabetes well controlled based on glycemic numbers shown today and we do not need to repeat A1c at this time we can wait till the next visit  No change in current medication profile

## 2020-01-22 NOTE — Assessment & Plan Note (Signed)
Severe caries in the teeth and dental disease  I gave the patient a list of dentist to choose from to consider having further teeth extractions

## 2020-01-22 NOTE — Assessment & Plan Note (Signed)
Continue lipid medications for now

## 2020-01-22 NOTE — Assessment & Plan Note (Signed)
Decreased hearing in both ears  Exam shows minimal earwax  We will plan audiology screening evaluation

## 2020-01-22 NOTE — Progress Notes (Signed)
Subjective:    Patient ID: Cameron Arroyo, male    DOB: 25-Oct-1951, 69 y.o.   MRN: [id number]  68 y.o.M post hosp f/u and to establish PCP When I entered the room this patient was extremely angry and I had to spend at least 10 minutes of the visit initially de-escalating the patient's anger.  The patient was accompanied by his spouse who also was very frustrated.  Their statement is that when the patient was admitted from the 21st and 24 September due to the COVID restrictions the spouse could not visit and the patient felt that he was getting conflicting information between various providers who saw him during his short visit.  Also the patient was to receive a post hospital visit with cardiology but that appointment did not appear to be made as of yet. The patient had a former primary care provider at the [facility] site however is wishing to establish care.  Has not been seen by primary care provider in 6 years.  During the hospitalization the patient was found to have new onset diabetes with hemoglobin A1c of 11.5.  He was to go home on Lantus but his insurance would not cover this so he switched to NPH 15 units in the morning and 5 units in the evening.  He is also on 500 mg once daily of metformin.  The patient brings a very complete record of all of his weights blood pressures heart rate and blood sugars.  His blood sugars appear to range anywhere from a low of 1 10-1 20 to a high of 1 70-1 80.  His morning sugars tend to run lower his afternoon sugars tend to run higher.  The patient's weight has gone down he did suffer significant weight loss in the past.  He is 189 pounds today and note his CBG today is 172  The patient did have polyuria and polydipsia this is improved somewhat but is still occurring.  The patient was admitted he had atrial fibrillation atrial flutter with rapid ventricular response and was hypotensive secondary to hypovolemia with associated hypermagnesemia  As the  patient's electrolytes and fluids were replaced and replenished the patient improved.  Once again he was sent home on the NPH and metformin as noted above.  The patient was also cardioverted during this hospitalization and placed on low-dose metoprolol along with Eliquis twice daily  Below is a copy of the discharge summary  Patient: Cameron Arroyo                                                         Admit date: 08/20/2019   DOB: [birth year]                                                              Discharge date:08/23/2019/11:20 AM LHT:[id number]  Discharge Diagnoses:   Principal Problem:   New onset type 2 diabetes mellitus ([place]) Active Problems:   Atrial flutter with rapid ventricular response (HCC)   Leukocytosis   Unintentional weight loss   Polyuria   Hypotension due to hypovolemia   Diabetes mellitus due to underlying condition, uncontrolled, with hyperglycemia ([place])   History of Present Illness/ Hospital Course Cameron Arroyo Summary: Cameron Jenkins Jonesis a 69 y.o.malewithno significant past medical history came to the emergency department with multiple problems. He reports that he feels like crap, polyuria, dry mouth, decreased appetite, weight loss, shortness of breath, anxiety, no energy since 2 to 3 weeks. He went to [facility] health urgent care initially where he was found to have atrial flutter with rapid response and was advised to go to the emergency department for further evaluation and management. He reports shortness of breath but denies association with chest pain, palpitation, headache, blurry vision, lightheadedness, dizziness, nausea, vomiting, epigastric pain, diarrhea, constipation, fever, chills, decreased appetite, night sweats, family history of thyroid issues, colon cancer, hematemesis, melena,  over-the-counter use of NSAIDs. Reports unintentional weight loss of 30 pounds in the last 1 year. He is a former smoker however denies alcohol, illicit drug use. He was prescribed steroids by his PCP recently for left knee pain. Patient reports that his left knee pain is better.   ED Course:Patient was found to have rapid narrow complex tachycardia suggestive of atrial flutter.Hyperglycemia of blood glucose 430, and leukocytosis of 12.7. Initial troponin x1-. Magnesium level elevated at 2.5. Chest x-ray came back negative. COVID-19: Pending. CBC: H&H is stable. Kidney function: WNL.[name] Kitchen Started on IV diltiazem and IV heparin.Transthoracic echo was ordered which was not performed due to patient's tachycardia.  Briefly patient was admitted with atrial flutter, status post TEE cardioversion which was successful on 08/22/2019 currently in normal sinus rhythm.  Per cardiology patient started on metoprolol and Eliquis.  Patient was noted from new onset diabetes mellitus with a hemoglobin A1c of 11.5.  Patient started on Lantus and metformin.  He was educated on diabetes, diet, instructed to check his blood sugars 3 times daily-4 times daily, keep a log for adjustment and titration of the medication for better glycemic control.  Patient was deemed stable was discharged home.   Discharge summary/Assessment & Plan:   Hyperglycemia/ newly diagnosed diabetesmellitus, POA: -Blood sugar more than 400 upon arrival -A1C 11.5 -Patient presented with polyuria, dry mouth, weight loss, fatigue -will start Lantus 20u / day and SSI/started on metformin 500 mg -Patient was given glucometer, instructed to check blood sugars 3 times daily-4 times daily, keep close log... Follow with PCP with adjustment of medication accordingly.  -Status post volume repletion -consult diabetes team for education; dietary following  Atrial flutter, asymptomatic, POA: -Patientasymptomatic regarding  arrhythmia -Patient heart rates more than 150s - trop neg and CXR negative - Thyroid function studyWNL - seen by cardiology, on IV heparin and IV diltiazem now- pendingTEE/cardioversionOn  08/22/19 -Cardioversion was successful, patient is currently in normal sinus rhythm -Per cardiology patient was started on metoprolol and Eliquis prescription were provided  Leukocytosis,reactive, resolved: -Likely reactive given recent steroid use, A. fib/flutter and hyperglycemia -Resolved no signs of infection.  Unintentional weight loss: -Reported loss of30 pounds in 1 year. -Likely in the setting of poorly controlled diabetes with A1c of 11.5 -Patient was instructed to keep an close log of his weight changes.   Code Status:Partial, DO NOT INTUBATE Family Communication:None present Disposition Plan: Patient will be discharged home, with close follow with  PCP and cardiologist in 1 to 2 weeks.  Consultants:  Cardiology  Procedures:  Status post tentative cardioversion on 08/22/2019 currently on normal sinus rhythm    Note the patient has also recently been seen by sports medicine physician in the [facility] office here in [place] who is referring him to Dr. Sherwood Gambler of neurosurgery along with an MRI because of severe low back pain.  Note the note the neurosurgeon had done cervical spine surgery in the past.  Below is the office note and plan from that visit Low back pain lumbar issue saw sports medicine 09/11/19: Assessment Note I had a long discussion with the patient. He is quite miserable today. I do believe this is all emanating from his lumbar spine. He is now greater than 6 weeks into conservative care and not improving at all. The prednisone did not help and he reports may have made him worse. I will place him on to gabapentin and titrate up slowly as tolerated. He will monitor for sedation. We will obtain an MRI scan of his lumbar spine. He would like to be  referred to Dr. Sherwood Gambler for further treatment. I will go and see if I can make the arrangements for him. He is quite frustrated with his pain and would like to see all of his options. All questions were encouraged and answered.   1. Low back pain . XR, lumbar spine  . MRI, lumbar spine, w/o contrast - mri lumbar spine r/o hnp  . magnetic resonance imaging (MRI): about this test . G-MRI Self Disclosure Notice . neurosurgery referral  2. Pain in left knee 3. Lumbar spondylosis with myelopathy . gabapentin 300 mg capsule    The patient also shared with me his diet plan and all of his meals are carefully written down by the spouse.  Note that he is eating a lot of potatoes and grits with his meals.  Note also the patient's PHQ 9 is extremely high during this visit and it is clear that he is experiencing some degree of frustration with healthcare system and has been experiencing quite a bit of anxiety and depression.   10/02/2019 This patient returns in follow-up for diabetes, atrial flutter, hyperlipidemia, lumbar iSpondylosis with myelopathy. At the last visit we obtained a lipid panel showing an LDL of 100 and elected to begin atorvastatin 10 mg daily.  The patient is yet to pick this up because when he was on a lipid medication 7 years ago he paid $3 a pill.  I explained to the patient this is now generic medicine and should not of been that expensive.  He wants to discuss the need for this with his cardiologist and his appointment which she has tomorrow morning.  The patient states he has been doing better with his blood glucoses and displays a very nice log.  His blood sugars run typically fasting 100-120 and postprandial and late in the afternoon will run as high as 170 but more recently with increases in his tresbia have been running closer to the 120-130 range The patient did decline at this visit a flu vaccine and pneumonia vaccine  Patient did go to podiatry and had his toenails  debrided and had a normal foot exam  The patient is seeing physical therapy for his chronic low back pain and as well has upcoming appointments with neurosurgery  The patient's question is can he come off the Eliquis should he require a surgical procedure on his lumbar spine I asked him  to discuss that with his cardiologist     12/15: Since the last visit the patient states his blood sugars have been in improved range and he brings a diary with him showing glucoses in the 100-120 range morning and evening.  There are no postprandial glucoses and today his glucose was 211 however this was after eating a bowl of grits before arriving to the clinic.  Blood pressures have been in great control and align with a blood pressure reading today 118/72.  Pulse rates been as low as 47 but in general was in the 50s to mid 60s on the atenolol.  Note hemoglobin A1c was 6.7 is a preop value before lumbar surgery.  He successfully underwent lumbar surgery per neurosurgery and is recovering from this.  He is not yet in physical therapy but has an upcoming post hospital appointment with the neurosurgeon.  He had been no episodes of palpitations at this time noted.  He states his fatigue is improved and his urination is improved as well.  He continues to be concerned about the cost of his a atorvastatin.  He is concerned when his deductible runs out his cost will be increased.  The patient does say his fatigue is better.  His lower back pain is improved from the surgery.  He has minimal edema in the ankles.  He is compliant with his insulin and Metformin program.  He has been monitoring his sugars.   01/22/2020 Since the last office visit the patient has shown continued improvement in blood pressure and diabetes control.  He brings his diary with him and most of his glucoses have been fasting in the 100 range and postprandial no higher than 140.  Occasionally with cheat meals he has been as high as 240  postprandial  Blood pressures also been good at home in the 110-120/70 range pulse is in the 60s and 70s.  He denies any palpitations or increased shortness of breath.  He does have a history of paroxysmal atrial fibrillation and has a chads vascular 2 score and he has since met with cardiology and they recommend that he continue the Eliquis for now that he stops that he is high risk for stroke unless he undergoes a catheter ablation.  The patient tells me today he does not wish to undergo a catheter ablation.  Patient is maintaining atorvastatin 10 mg daily and also Tresiba at 15 units daily of Metformin at 1000 mg twice daily.  He has lumbar disease is improved status post surgery.  Patient does complain of bilateral hearing loss and as well complains of severe dentition issues needing further dental care   Past Medical History:  Diagnosis Date  . Arthritis   . Asthma    as child  . Atrial flutter with rapid ventricular response ([place]) 08/20/2019  . Lumbar disc herniation 11/01/2019  . New onset type 2 diabetes mellitus ([place]) 08/20/2019  . Pneumonia    history of     Family History  Problem Relation Age of Onset  . Cirrhosis Mother   . Cancer Father      Social History   Socioeconomic History  . Marital status: Married    Spouse name: Not on file  . Number of children: Not on file  . Years of education: Not on file  . Highest education level: Not on file  Occupational History  . Not on file  Tobacco Use  . Smoking status: Former Smoker    Quit date: 11/29/1992  Years since quitting: 27.1  . Smokeless tobacco: Never Used  Substance and Sexual Activity  . Alcohol use: No  . Drug use: Never  . Sexual activity: Not Currently  Other Topics Concern  . Not on file  Social History Narrative  . Not on file   Social Determinants of Health   Financial Resource Strain:   . Difficulty of Paying Living Expenses: Not on file  Food Insecurity:   . Worried About Sales executive in the Last Year: Not on file  . Ran Out of Food in the Last Year: Not on file  Transportation Needs:   . Lack of Transportation (Medical): Not on file  . Lack of Transportation (Non-Medical): Not on file  Physical Activity:   . Days of Exercise per Week: Not on file  . Minutes of Exercise per Session: Not on file  Stress:   . Feeling of Stress : Not on file  Social Connections:   . Frequency of Communication with Friends and Family: Not on file  . Frequency of Social Gatherings with Friends and Family: Not on file  . Attends Religious Services: Not on file  . Active Member of Clubs or Organizations: Not on file  . Attends Archivist Meetings: Not on file  . Marital Status: Not on file  Intimate Partner Violence:   . Fear of Current or Ex-Partner: Not on file  . Emotionally Abused: Not on file  . Physically Abused: Not on file  . Sexually Abused: Not on file     No Known Allergies   Outpatient Medications Prior to Visit  Medication Sig Dispense Refill  . atorvastatin (LIPITOR) 10 MG tablet TAKE 1 TABLET BY MOUTH EVERY DAY 90 tablet 0  . ELIQUIS 5 MG TABS tablet TAKE 1 TABLET BY MOUTH TWICE A DAY 180 tablet 1  . metFORMIN (GLUCOPHAGE) 1000 MG tablet TAKE 1 TABLET (1,000 MG TOTAL) BY MOUTH 2 (TWO) TIMES DAILY WITH A MEAL. 180 tablet 0  . metoprolol tartrate (LOPRESSOR) 25 MG tablet TAKE 1/2 TABLET BY MOUTH TWICE A DAY (Patient taking differently: 12.5 mg 2 (two) times daily. ) 90 tablet 0  . TRESIBA FLEXTOUCH 100 UNIT/ML SOPN FlexTouch Pen INJECT 15 UNITS TOTAL INTO THE SKIN AT BEDTIME. 15 mL 1  . gabapentin (NEURONTIN) 300 MG capsule Take 300 mg by mouth daily as needed (pain).     [name] Kitchen HYDROcodone-acetaminophen (NORCO/VICODIN) 5-325 MG tablet Take 1-2 tablets by mouth every 4 (four) hours as needed (pain). (Patient not taking: Reported on 01/22/2020) 40 tablet 0   No facility-administered medications prior to visit.      Review of Systems  Pos in  BOLD Constitutional:     weight loss, night sweats,  Fevers, chills, fatigue, lassitude. HEENT:   No headaches,  Difficulty swallowing,  Tooth/dental problems,  Sore throat,                No sneezing, itching, ear ache, nasal congestion, post nasal drip,   CV:  No chest pain,  Orthopnea, PND, swelling in lower extremities, anasarca, dizziness, palpitations  GI  No heartburn, indigestion, abdominal pain, nausea, vomiting, diarrhea, change in bowel habits, loss of appetite  Resp: No shortness of breath with exertion or at rest.  No excess mucus, no productive cough,  No non-productive cough,  No coughing up of blood.  No change in color of mucus.  No wheezing.  No chest wall deformity  Skin: no rash or lesions.  GU: no  dysuria, change in color of urine, no urgency or frequency.  No flank pain. polyuria  MS:  No joint pain or swelling.  No decreased range of motion.   back pain.    Psych:   change in mood or affect.  depression or anxiety.  No memory loss.     Objective:   Physical Exam Vitals:   01/22/20 1047  BP: 115/71  Pulse: 66  Resp: 18  Temp: 98.2 F (36.8 C)  TempSrc: Temporal  SpO2: 98%  Weight: 192 lb (87.1 kg)  Height: 6' 4" (1.93 m)    Wt Readings from Last 3 Encounters:  01/22/20 192 lb (87.1 kg)  11/27/19 192 lb 12.8 oz (87.5 kg)  11/13/19 193 lb 9.6 oz (87.8 kg)    Gen: The patient is in much better spirits today and is in no acute distress weight has stabilized  ENT: No lesions,  mouth clear,  oropharynx clear, no postnasal drip Severe periodontal disease and carious teeth  Neck: No JVD, no TMG, no carotid bruits  Lungs: No use of accessory muscles, no dullness to percussion, clear without rales or rhonchi  Cardiovascular: RRR, heart sounds normal, no murmur or gallops, no peripheral edema  Abdomen: soft and NT, no HSM,  BS normal  Musculoskeletal: No deformities, no cyanosis or clubbing  Neuro: alert, non focal  Skin: Warm, no lesions or  rashes  ECG 09/11/19:   Sinus bradycardia,  PACs otherwise normal   ECG 11/3:  Sinus brady.   CHA2DS2/VAS Stroke Risk Points  Current as of 2 hours ago     2 >= 2 Points: High Risk  1 - 1.99 Points: Medium Risk  0 Points: Low Risk    The patient's score has not changed in the past year.: No Change     Details    This score determines the patient's risk of having a stroke if the  patient has atrial fibrillation.       Points Metrics  0 Has Congestive Heart Failure:  No    Current as of 2 hours ago  0 Has Vascular Disease:  No    Current as of 2 hours ago  0 Has Hypertension:  No    Current as of 2 hours ago  1 Age:  20    Current as of 2 hours ago  1 Has Diabetes:  Yes    Current as of 2 hours ago  0 Had Stroke:  No  Had TIA:  No  Had thromboembolism:  No    Current as of 2 hours ago  0 Male:  No    Current as of 2 hours ago    CXR 9/21: NAD  TEE: FINDINGS:  LEFT VENTRICLE: EF = 50-55%  RIGHT VENTRICLE: Normal size and function.   LEFT ATRIUM: No thrombus/mass.  LEFT ATRIAL APPENDAGE: No thrombus/mass.   RIGHT ATRIUM: No thrombus/mass.  AORTIC VALVE:  Trileaflet. No regurgitation.   MITRAL VALVE:    Normal structure. Mild regurgitation.   TRICUSPID VALVE: Normal structure. No regurgitation.   PULMONIC VALVE: Grossly normal structure. No regurgitation. No apparent vegetation.  INTERATRIAL SEPTUM: No PFO or ASD seen by color Doppler.  PERICARDIUM: No effusion noted.  DESCENDING AORTA: Mild plaque    CARDIOVERSION:     Indications:  Symptomatic Atrial Flutter  Procedure Details:  Once the TEE was complete, the patient had the defibrillator pads placed in the anterior and posterior position. Once an appropriate level of sedation was confirmed, the patient  was cardioverted x 1 with 100J of biphasic synchronized energy.  The patient converted to NSR.  There were no apparent complications.  The patient had normal neuro status and  respiratory status post procedure with vitals stable as recorded elsewhere.  Adequate airway was maintained throughout and vital signs monitored per protocol.   Lab Results  Component Value Date   HGBA1C 6.7 (H) 10/30/2019   BMP Latest Ref Rng & Units 10/30/2019 09/11/2019 08/23/2019  Glucose 70 - 99 mg/dL 132(H) 144(H) 143(H)  BUN 8 - 23 mg/dL _0 Creatinine 0.61 - 1.24 mg/dL 0.83 0.88 0.86  BUN/Creat Ratio 10 - 24 - 16 -  Sodium 135 - 145 mmol/L 141 141 137  Potassium 3.5 - 5.1 mmol/L 4.1 4.2 3.6  Chloride 98 - 111 mmol/L 105 106 106  CO2 22 - 32 mmol/L _1 Calcium 8.9 - 10.3 mg/dL 9.1 9.2 8.4(L)   Hepatic Function Latest Ref Rng & Units 09/11/2019 08/21/2019  Total Protein 6.0 - 8.5 g/dL 6.1 5.2(L)  Albumin 3.8 - 4.8 g/dL 4.4 3.0(L)  AST 0 - 40 IU/L 17 15  ALT 0 - 44 IU/L 26 21  Alk Phosphatase 39 - 117 IU/L 65 66  Total Bilirubin 0.0 - 1.2 mg/dL 0.4 1.3(H)   CBC Latest Ref Rng & Units 10/30/2019 08/23/2019 08/22/2019  WBC 4.0 - 10.5 K/uL 5.8 8.3 7.3  Hemoglobin 13.0 - 17.0 g/dL 14.3 13.4 13.3  Hematocrit 39.0 - 52.0 % 43.9 37.7(L) 39.1  Platelets 150 - 400 K/uL 213 115(L) 131(L)   Lipid Panel     Component Value Date/Time   CHOL 168 09/11/2019 1145   TRIG 153 (H) 09/11/2019 1145   HDL 41 09/11/2019 1145   CHOLHDL 4.1 09/11/2019 1145   LDLCALC 100 (H) 09/11/2019 1145   LABVLDL 27 09/11/2019 1145    Hep C neg Microalbumin Normal      Assessment & Plan:  I personally reviewed all images and lab data in the [facility] system as well as any outside material available during this office visit and agree with the  radiology impressions.   PAF (paroxysmal atrial fibrillation) (HCC) Paroxysmal atrial flutter atrial fibrillation with cardioversion in 2020  Patient is currently in sinus rhythm but because of high risk for stroke will need to remain on Eliquis along with low-dose metoprolol and the patient is comfortable with this plan  The patient does not wish to  pursue catheter ablation  Dental caries Severe caries in the teeth and dental disease  I gave the patient a list of dentist to choose from to consider having further teeth extractions  Type 2 diabetes mellitus, controlled ([place]) Type 2 diabetes well controlled based on glycemic numbers shown today and we do not need to repeat A1c at this time we can wait till the next visit  No change in current medication profile  Hyperlipidemia associated with type 2 diabetes mellitus ([place]) Continue lipid medications for now  Decreased hearing of both ears Decreased hearing in both ears  Exam shows minimal earwax  We will plan audiology screening evaluation   Hilberto was seen today for follow-up.  Diagnoses and all orders for this visit:  Controlled type 2 diabetes mellitus with hyperglycemia, with long-term current use of insulin ([place]) -     Cancel: POCT A1C -     Cancel: Glucose (CBG)  PAF (paroxysmal atrial fibrillation) (HCC)  Hyperlipidemia associated with type 2 diabetes mellitus (HCC)  Chronic anticoagulation  Periodontal disease  Dental caries  Decreased hearing of both ears -     Ambulatory referral to Audiology  Colon cancer screening -     Ambulatory referral to Gastroenterology    The patient also is due a colonoscopy his last one was over 10 years ago and he did have polyps at that time therefore will make a referral to gastroenterology for the polyp exam  Patient also knows an ophthalmology exam is needed this year for his retinal evaluations

## 2020-02-07 ENCOUNTER — Telehealth: Payer: Self-pay

## 2020-02-07 ENCOUNTER — Encounter: Payer: Self-pay | Admitting: Gastroenterology

## 2020-02-07 ENCOUNTER — Ambulatory Visit: Payer: 59 | Admitting: Gastroenterology

## 2020-02-07 ENCOUNTER — Other Ambulatory Visit: Payer: Self-pay

## 2020-02-07 VITALS — BP 90/50 | HR 74 | Temp 97.2°F | Ht 74.5 in | Wt 188.4 lb

## 2020-02-07 DIAGNOSIS — R1310 Dysphagia, unspecified: Secondary | ICD-10-CM

## 2020-02-07 DIAGNOSIS — Z7901 Long term (current) use of anticoagulants: Secondary | ICD-10-CM

## 2020-02-07 DIAGNOSIS — Z01818 Encounter for other preprocedural examination: Secondary | ICD-10-CM

## 2020-02-07 DIAGNOSIS — Z1211 Encounter for screening for malignant neoplasm of colon: Secondary | ICD-10-CM | POA: Diagnosis not present

## 2020-02-07 MED ORDER — SUTAB 1479-225-188 MG PO TABS: 1.0000 | ORAL_TABLET | Freq: Once | ORAL | 0 refills | Status: AC

## 2020-02-07 NOTE — Telephone Encounter (Signed)
Patient with diagnosis of atrial fibrillation on Eliquis for anticoagulation.    Procedure: Endoscopy/Colonoscopy Date of procedure: 02/19/20  CHADS2-VASc score of 2 (AGE, DM2)  CrCl 104.3 ml/min Platelet count 213  Per office protocol, patient can hold Eliquis for 1-2 days prior to procedure.

## 2020-02-07 NOTE — Telephone Encounter (Signed)
Called and spoke to pt.  He confirmed understanding to hold Eliquis for 2 days prior to procedure on 3-23.

## 2020-02-07 NOTE — Patient Instructions (Addendum)
If you are age 68 or older, your body mass index should be between 23-30. Your Body mass index is 23.86 kg/m. If this is out of the aforementioned range listed, please consider follow up with your Primary Care Provider.  If you are age 35 or younger, your body mass index should be between 19-25. Your Body mass index is 23.86 kg/m. If this is out of the aformentioned range listed, please consider follow up with your Primary Care Provider.   You have been scheduled for an endoscopy/colonoscopy. Please follow written instructions given to you at your visit today.  Please pick up your prep supplies at the pharmacy within the next 1-3 days. If you use inhalers (even only as needed), please bring them with you on the day of your procedure. Your physician has requested that you go to www.startemmi.com and enter the access code given to you at your visit today. This web site gives a general overview about your procedure. However, you should still follow specific instructions given to you by our office regarding your preparation for the procedure.   We have given you samples of the following medication to take: SUTAB colonoscopy prep  You will be contacted by our office prior to your procedure for directions on holding your ELIQUIS.  If you do not hear from our office 1 week prior to your scheduled procedure, please call [phone] to discuss.   We will request your colonoscopy records from Dr. Ulyses Amor office.  Thank you for entrusting me with your care and for choosing [facility], Dr. [place] [facility]

## 2020-02-07 NOTE — Telephone Encounter (Signed)
[facility] Pre-operative Risk Assessment     Request for surgical clearance:     Endoscopy Procedure  What type of surgery is being performed?    Endoscopy/Colonoscopy  When is this surgery scheduled?     02-19-20  What type of clearance is required ?   Pharmacy  Are there any medications that need to be held prior to surgery and how long? Eliquis 2 days  Practice name and name of physician performing surgery? [place],   [facility]  What is your office phone and fax number?   Phone- [phone]  Fax- [phone] Attn: Lemar Lofty, CMA  Anesthesia type (None, local, MAC, general) ?       MAC  THANK YOU

## 2020-02-07 NOTE — Progress Notes (Signed)
HPI :  69 year old male with a history of atrial flutter on Eliquis, diabetes, referred by Dr. Asencion Noble discuss colon cancer screening.   Patient states he had a colonoscopy about 15 years ago.  He thinks he may have had some polyps removed but he is really not sure, no report on file.  He thinks this may have been done with Dr. Nelva Nay practice.  He denies any problems with his bowels at all.  No blood in his stools.  No abdominal pains.  He denies any family history of colon cancer.  Review of systems he does endorse ongoing dysphagia.  He states he had cervical fusion surgery in 2014, and since that time he has had periodic dysphagia to solids in his throat.  He states sometimes this can be quite scary when he swallowing bread, can make him feel he has a hard time catching his breath.  Overall it does not happen too frequently but when it does it really bothers him.  He denies any history of food impaction.  He often tries to swallow some liquids to help push things down.  He has never had this evaluated in the past.  No family history of esophageal or gastric cancer.  His mother had cirrhosis and Crohn's disease.  He does have a history of atrial flutter with rapid response this past September.  Is currently managed with Eliquis.  At the time of diagnosis he was severely hyperglycemic with a new diagnosis of diabetes with A1c of 11.5.  Over time following therapy his A1c is down to the 6s.  Denies any cardiopulmonary symptoms at this time and feels well..   Echo 08/22/19 - EF 60-65%  Past Medical History:  Diagnosis Date  . Arthritis   . Asthma    as child  . Atrial flutter with rapid ventricular response ([place]) 08/20/2019  . Lumbar disc herniation 11/01/2019  . New onset type 2 diabetes mellitus ([place]) 08/20/2019  . Pneumonia    history of     Past Surgical History:  Procedure Laterality Date  . ANTERIOR CERVICAL DECOMP/DISCECTOMY FUSION N/A 03/01/2013   Procedure: Cervical  Four-Five Cervical Five-Six Cervical Six-Seven anterior cervical decompression with fusion plating and bonegraft;  Surgeon: Hosie Spangle, MD;  Location: [facility];  Service: Neurosurgery;  Laterality: N/A;  ANTERIOR CERVICAL DECOMPRESSION/DISCECTOMY FUSION 3 LEVELS  . CARDIOVERSION N/A 08/22/2019   Procedure: CARDIOVERSION;  Surgeon: Donato Heinz, MD;  Location: [facility];  Service: Endoscopy;  Laterality: N/A;  . TEE WITHOUT CARDIOVERSION N/A 08/22/2019   Procedure: TRANSESOPHAGEAL ECHOCARDIOGRAM (TEE);  Surgeon: Donato Heinz, MD;  Location: [facility];  Service: Endoscopy;  Laterality: N/A;   Family History  Problem Relation Age of Onset  . Cirrhosis Mother   . Cancer Father    Social History   Tobacco Use  . Smoking status: Former Smoker    Quit date: 11/29/1992    Years since quitting: 27.2  . Smokeless tobacco: Never Used  Substance Use Topics  . Alcohol use: No  . Drug use: Never   Current Outpatient Medications  Medication Sig Dispense Refill  . atorvastatin (LIPITOR) 10 MG tablet TAKE 1 TABLET BY MOUTH EVERY DAY 90 tablet 0  . ELIQUIS 5 MG TABS tablet TAKE 1 TABLET BY MOUTH TWICE A DAY 180 tablet 1  . metFORMIN (GLUCOPHAGE) 1000 MG tablet TAKE 1 TABLET (1,000 MG TOTAL) BY MOUTH 2 (TWO) TIMES DAILY WITH A MEAL. 180 tablet 0  . metoprolol tartrate (LOPRESSOR)  25 MG tablet TAKE 1/2 TABLET BY MOUTH TWICE A DAY (Patient taking differently: 12.5 mg 2 (two) times daily. ) 90 tablet 0  . TRESIBA FLEXTOUCH 100 UNIT/ML SOPN FlexTouch Pen INJECT 15 UNITS TOTAL INTO THE SKIN AT BEDTIME. 15 mL 1   No current facility-administered medications for this visit.   No Known Allergies   Review of Systems: All systems reviewed and negative except where noted in HPI.   Lab Results  Component Value Date   WBC 5.8 10/30/2019   HGB 14.3 10/30/2019   HCT 43.9 10/30/2019   MCV 96.9 10/30/2019   PLT 213 10/30/2019    Lab Results  Component Value Date    CREATININE 0.83 10/30/2019   BUN 17 10/30/2019   NA 141 10/30/2019   K 4.1 10/30/2019   CL 105 10/30/2019   CO2 23 10/30/2019    Lab Results  Component Value Date   ALT 26 09/11/2019   AST 17 09/11/2019   ALKPHOS 65 09/11/2019   BILITOT 0.4 09/11/2019     Physical Exam: BP (!) 90/50   Pulse 74   Temp (!) 97.2 F (36.2 C)   Ht 6' 2.5" (1.892 m)   Wt 188 lb 6 oz (85.4 kg)   BMI 23.86 kg/m  Constitutional: Pleasant,well-developed, male in no acute distress. HEENT: Normocephalic and atraumatic. Conjunctivae are normal. No scleral icterus. Neck supple.  Cardiovascular: Normal rate, regular rhythm.  Pulmonary/chest: Effort normal and breath sounds normal. No wheezing, rales or rhonchi. Abdominal: Soft, nondistended, nontender. . There are no masses palpable. No hepatomegaly. Extremities: no edema Lymphadenopathy: No cervical adenopathy noted. Neurological: Alert and oriented to person place and time. Skin: Skin is warm and dry. No rashes noted. Psychiatric: Normal mood and affect. Behavior is normal.   ASSESSMENT AND PLAN: 69 year old male here for new patient assessment of the following issues:  Dysphagia - ongoing for at least 7 years.  This could be related to his prior cervical fusion based on timing, but I do recommend further evaluation to rule out peptic stricture, esophageal mass lesion etc.  I discussed options with him and how to evaluate this with either barium study versus endoscopy.  I outlined both of these with him, discussed risks and benefits of endoscopy.  If we do an endoscopy I would perform empiric dilation if nothing obvious.  Following discussion of options he wanted to proceed with endoscopy.  He will need to hold the Eliquis for 2 days prior to the procedure, we will ask approval from his cardiologist to ensure that is okay.  Further recommendations pending results.  He agreed  Colon cancer screening / anticoagulated - overdue for routine screening.  He  has no symptoms.  He seems stable from a cardiovascular perspective and echo shows normal ejection fraction.  We discussed options, he is agreeable to optical colonoscopy.  We discussed risks and benefits, he is at risk for bleeding will need to hold Eliquis for 2 days prior to the procedure.  We will reach out to his cardiologist to ensure that is okay.  Further recommendations pending the results.  He agreed.  In the interim we will try to get his records from his last colonoscopy if possible to clarify findings  [place], MD [facility]  CC: Elsie Stain, MD

## 2020-02-15 ENCOUNTER — Other Ambulatory Visit (HOSPITAL_COMMUNITY)
Admission: RE | Admit: 2020-02-15 | Discharge: 2020-02-15 | Disposition: A | Discharge status: Home / Self Care | Payer: 59 | Source: Ambulatory Visit | Attending: Gastroenterology | Admitting: Gastroenterology

## 2020-02-15 DIAGNOSIS — Z20822 Contact with and (suspected) exposure to covid-19: Secondary | ICD-10-CM | POA: Insufficient documentation

## 2020-02-15 DIAGNOSIS — Z01812 Encounter for preprocedural laboratory examination: Secondary | ICD-10-CM | POA: Diagnosis not present

## 2020-02-15 LAB — SARS CORONAVIRUS 2 (TAT 6-24 HRS): SARS Coronavirus 2: NEGATIVE

## 2020-02-19 ENCOUNTER — Encounter: Payer: Self-pay | Admitting: Gastroenterology

## 2020-02-19 ENCOUNTER — Ambulatory Visit (AMBULATORY_SURGERY_CENTER): Payer: 59 | Admitting: Gastroenterology

## 2020-02-19 ENCOUNTER — Other Ambulatory Visit: Payer: Self-pay

## 2020-02-19 VITALS — BP 95/59 | HR 53 | Temp 95.9°F | Resp 13 | Ht 74.0 in | Wt 188.0 lb

## 2020-02-19 DIAGNOSIS — R1310 Dysphagia, unspecified: Secondary | ICD-10-CM

## 2020-02-19 DIAGNOSIS — D123 Benign neoplasm of transverse colon: Secondary | ICD-10-CM | POA: Diagnosis not present

## 2020-02-19 DIAGNOSIS — K3189 Other diseases of stomach and duodenum: Secondary | ICD-10-CM | POA: Diagnosis not present

## 2020-02-19 DIAGNOSIS — Z1211 Encounter for screening for malignant neoplasm of colon: Secondary | ICD-10-CM | POA: Diagnosis not present

## 2020-02-19 DIAGNOSIS — K2970 Gastritis, unspecified, without bleeding: Secondary | ICD-10-CM

## 2020-02-19 DIAGNOSIS — D122 Benign neoplasm of ascending colon: Secondary | ICD-10-CM

## 2020-02-19 MED ORDER — SODIUM CHLORIDE 0.9 % IV SOLN: 500.0000 mL | Freq: Once | INTRAVENOUS | Status: DC

## 2020-02-19 MED ORDER — OMEPRAZOLE 20 MG PO CPDR: 20.0000 mg | DELAYED_RELEASE_CAPSULE | Freq: Every day | ORAL | 1 refills | Status: DC

## 2020-02-19 NOTE — Op Note (Signed)
Cameron Arroyo Patient Name: Cameron Arroyo Procedure Date: 02/19/2020 2:54 PM MRN: [id number] Endoscopist: Remo Lipps P. Havery [name] , MD Age: 69 Referring MD:  Date of Birth: 1951/08/19 Gender: Male Account #: [id number] Procedure:                Colonoscopy Indications:              Screening for colorectal malignant neoplasm Medicines:                Monitored Anesthesia Care Procedure:                Pre-Anesthesia Assessment:                           - Prior to the procedure, a History and Physical                            was performed, and patient medications and                            allergies were reviewed. The patient's tolerance of                            previous anesthesia was also reviewed. The risks                            and benefits of the procedure and the sedation                            options and risks were discussed with the patient.                            All questions were answered, and informed consent                            was obtained. Prior Anticoagulants: The patient has                            taken Eliquis (apixaban), last dose was 2 days                            prior to procedure. ASA Grade Assessment: III - A                            patient with severe systemic disease. After                            reviewing the risks and benefits, the patient was                            deemed in satisfactory condition to undergo the                            procedure.  After obtaining informed consent, the colonoscope                            was passed under direct vision. Throughout the                            procedure, the patient's blood pressure, pulse, and                            oxygen saturations were monitored continuously. The                            Colonoscope was introduced through the anus and                            advanced to the the cecum, identified by                             appendiceal orifice and ileocecal valve. The                            colonoscopy was performed without difficulty. The                            patient tolerated the procedure well. The quality                            of the bowel preparation was good. The ileocecal                            valve, appendiceal orifice, and rectum were                            photographed. Scope In: 3:16:50 PM Scope Out: 3:41:05 PM Scope Withdrawal Time: 0 hours 21 minutes 41 seconds  Total Procedure Duration: 0 hours 24 minutes 15 seconds  Findings:                 The perianal and digital rectal examinations were                            normal.                           Two sessile polyps were found in the ascending                            colon. The polyps were 3 to 8 mm in size (largest                            wrapping around back side of a fold). These polyps                            were removed with a cold snare. Resection and  retrieval were complete.                           A 3 mm polyp was found in the transverse colon. The                            polyp was sessile. The polyp was removed with a                            cold snare. Resection and retrieval were complete.                           Internal hemorrhoids were found during retroflexion.                           The exam was otherwise without abnormality. Complications:            No immediate complications. Estimated blood loss:                            Minimal. Estimated Blood Loss:     Estimated blood loss was minimal. Impression:               - Two 3 to 8 mm polyps in the ascending colon,                            removed with a cold snare. Resected and retrieved.                           - One 3 mm polyp in the transverse colon, removed                            with a cold snare. Resected and retrieved.                           - Internal  hemorrhoids.                           - The examination was otherwise normal. Recommendation:           - Patient has a contact number available for                            emergencies. The signs and symptoms of potential                            delayed complications were discussed with the                            patient. Return to normal activities tomorrow.                            Written discharge instructions were provided to the  patient.                           - Resume previous diet.                           - Continue present medications.                           - Resume Eliquis tomorrow                           - Await pathology results. Remo Lipps P. Havery [name], MD 02/19/2020 3:45:39 PM This report has been signed electronically.

## 2020-02-19 NOTE — Op Note (Signed)
Cameron Arroyo: Cameron Arroyo Procedure Date: 02/19/2020 2:59 PM MRN: [id number] Endoscopist: Cameron Arroyo , MD Age: 69 Referring MD:  Date of Birth: [birth year] Gender: Male Account #: [id number] Procedure:                Upper GI endoscopy Indications:              Dysphagia, history of c-spine surgery Medicines:                Monitored Anesthesia Care Procedure:                Pre-Anesthesia Assessment:                           - Prior to the procedure, a History and Physical                            was performed, and patient medications and                            allergies were reviewed. The patient's tolerance of                            previous anesthesia was also reviewed. The risks                            and benefits of the procedure and the sedation                            options and risks were discussed with the patient.                            All questions were answered, and informed consent                            was obtained. Prior Anticoagulants: The patient has                            taken Eliquis (apixaban), last dose was 2 days                            prior to procedure. ASA Grade Assessment: III - A                            patient with severe systemic disease. After                            reviewing the risks and benefits, the patient was                            deemed in satisfactory condition to undergo the                            procedure.  After obtaining informed consent, the endoscope was                            passed under direct vision. Throughout the                            procedure, the patient's blood pressure, pulse, and                            oxygen saturations were monitored continuously. The                            Endoscope was introduced through the mouth, and                            advanced to the second part of duodenum. The upper                            GI endoscopy was accomplished without difficulty.                            The patient tolerated the procedure well. Scope In: Scope Out: Findings:                 Esophagogastric landmarks were identified: the                            Z-line was found at 46 cm, the gastroesophageal                            junction was found at 46 cm and the upper extent of                            the gastric folds was found at 46 cm from the                            incisors.                           The exam of the esophagus was otherwise normal.                           A guidewire was placed and the scope was withdrawn.                            Empiric dilation was performed in the entire                            esophagus with a Savary dilator with mild                            resistance at 17 mm and 18 mm. Relook endoscopy  showed no mucosal wrents.                           Patchy moderate inflammation characterized by                            adherent old blood, erosions and erythema was found                            in the gastric body and in the gastric antrum.                           The exam of the stomach was otherwise normal.                           Biopsies were taken with a cold forceps in the                            gastric body, at the incisura and in the gastric                            antrum for Helicobacter pylori testing.                           The duodenal bulb and second portion of the                            duodenum were normal. Complications:            No immediate complications. Estimated blood loss:                            Minimal. Estimated Blood Loss:     Estimated blood loss was minimal. Impression:               - Esophagogastric landmarks identified.                           - Normal esophagus - empiric dilation performed to                            2mm given symptoms of  dysphagia.                           - Erosive Gastritis.                           - Normal stomach otherwise - biopsies taken to rule                            out H pylori                           - Normal duodenal bulb and second portion of the  duodenum. Recommendation:           - Patient has a contact number available for                            emergencies. The signs and symptoms of potential                            delayed complications were discussed with the                            patient. Return to normal activities tomorrow.                            Written discharge instructions were provided to the                            patient.                           - Resume previous diet.                           - Continue present medications.                           - Resume Eliquis tomorrow per colonoscopy note                           - Start omeprazole 20mg  once daily for erosive                            gastritis                           - Await pathology results and course post dilation                           - Avoid NSAIDs Cameron Renninger P. Evolett Somarriba, MD 02/19/2020 3:51:13 PM This report has been signed electronically.

## 2020-02-19 NOTE — Patient Instructions (Signed)
HANDOUTS PROVIDED ON: POLYPS, & HEMORRHOIDS  The polyps removed/biopsies taken today have been sent for pathology.  The results can take 1-3 weeks to receive.  When your next colonoscopy should occur will be based on the pathology results.    You may resume your previous diet.  You may take all regular medications today EXCEPT Eliquis which can be resumed tomorrow 02/20/20.  Start taking 20mg  of Omeprazole daily before breakfast and avoid all NSAIDs (Ibuprofen, Naproxen/Aleve, and Goody's or BC powders).  Thank you for allowing [place] to care for you today!!!   YOU HAD AN ENDOSCOPIC PROCEDURE TODAY AT [place]:   Refer to the procedure report that was given to you for any specific questions about what was found during the examination.  If the procedure report does not answer your questions, please call your gastroenterologist to clarify.  If you requested that your care partner not be given the details of your procedure findings, then the procedure report has been included in a sealed envelope for you to review at your convenience later.  YOU SHOULD EXPECT: Some feelings of bloating in the abdomen. Passage of more gas than usual.  Walking can help get rid of the air that was put into your GI tract during the procedure and reduce the bloating. If you had a lower endoscopy (such as a colonoscopy or flexible sigmoidoscopy) you may notice spotting of blood in your stool or on the toilet paper. If you underwent a bowel prep for your procedure, you may not have a normal bowel movement for a few days.  Please Note:  You might notice some irritation and congestion in your nose or some drainage.  This is from the oxygen used during your procedure.  There is no need for concern and it should clear up in a day or so.  SYMPTOMS TO REPORT IMMEDIATELY:   Following lower endoscopy (colonoscopy or flexible sigmoidoscopy):  Excessive amounts of blood in the stool  Significant tenderness or  worsening of abdominal pains  Swelling of the abdomen that is new, acute  Fever of 100F or higher   Following upper endoscopy (EGD)  Vomiting of blood or coffee ground material  New chest pain or pain under the shoulder blades  Painful or persistently difficult swallowing  New shortness of breath  Fever of 100F or higher  Black, tarry-looking stools  For urgent or emergent issues, a gastroenterologist can be reached at any hour by calling [phone]. Do not use MyChart messaging for urgent concerns.    DIET:  We do recommend a small meal at first, but then you may proceed to your regular diet.  Drink plenty of fluids but you should avoid alcoholic beverages for 24 hours.  ACTIVITY:  You should plan to take it easy for the rest of today and you should NOT DRIVE or use heavy machinery until tomorrow (because of the sedation medicines used during the test).    FOLLOW UP: Our staff will call the number listed on your records 48-72 hours following your procedure to check on you and address any questions or concerns that you may have regarding the information given to you following your procedure. If we do not reach you, we will leave a message.  We will attempt to reach you two times.  During this call, we will ask if you have developed any symptoms of COVID 19. If you develop any symptoms (ie: fever, flu-like symptoms, shortness of breath, cough etc.) before then, please  call [phone].  If you test positive for Covid 19 in the 2 weeks post procedure, please call and report this information to [place].    If any biopsies were taken you will be contacted by phone or by letter within the next 1-3 weeks.  Please call us at [phone] if you have not heard about the biopsies in 3 weeks.    SIGNATURES/CONFIDENTIALITY: You and/or your care partner have signed paperwork which will be entered into your electronic medical record.  These signatures attest to the fact that that the information  above on your After Visit Summary has been reviewed and is understood.  Full responsibility of the confidentiality of this discharge information lies with you and/or your care-partner.

## 2020-02-19 NOTE — Progress Notes (Signed)
PT taken to PACU. Monitors in place. VSS. Report given to RN. 

## 2020-02-19 NOTE — Progress Notes (Signed)
Pt's states no medical or surgical changes since previsit or office visit.  Temp- June Vitals- Donna 

## 2020-02-19 NOTE — Progress Notes (Signed)
Called to room to assist during endoscopic procedure.  Patient ID and intended procedure confirmed with present staff. Received instructions for my participation in the procedure from the performing physician.  

## 2020-02-21 ENCOUNTER — Telehealth: Payer: Self-pay

## 2020-02-21 NOTE — Telephone Encounter (Signed)
  Follow up Call-  Call back number 02/19/2020  Post procedure Call Back phone  # [phone]  Permission to leave phone message Yes  Some recent data might be hidden     Patient questions:  Do you have a fever, pain , or abdominal swelling? No. Pain Score  0 *  Have you tolerated food without any problems? Yes.    Have you been able to return to your normal activities? Yes.    Do you have any questions about your discharge instructions: Diet   No. Medications  No. Follow up visit  No.  Do you have questions or concerns about your Care? No.  Actions: * If pain score is 4 or above: No action needed, pain <4. 1. Have you developed a fever since your procedure? no  2.   Have you had an respiratory symptoms (SOB or cough) since your procedure? no  3.   Have you tested positive for COVID 19 since your procedure no  4.   Have you had any family members/close contacts diagnosed with the COVID 19 since your procedure?  no   If yes to any of these questions please route to Joylene John, RN and Erenest Rasher, RN

## 2020-03-22 ENCOUNTER — Other Ambulatory Visit: Payer: Self-pay | Admitting: Critical Care Medicine

## 2020-03-30 ENCOUNTER — Other Ambulatory Visit: Payer: Self-pay | Admitting: Critical Care Medicine

## 2020-04-19 ENCOUNTER — Other Ambulatory Visit: Payer: Self-pay | Admitting: Critical Care Medicine

## 2020-05-20 ENCOUNTER — Other Ambulatory Visit: Payer: Self-pay

## 2020-05-20 ENCOUNTER — Ambulatory Visit: Payer: 59 | Attending: Critical Care Medicine | Admitting: Critical Care Medicine

## 2020-05-20 ENCOUNTER — Encounter: Payer: Self-pay | Admitting: Critical Care Medicine

## 2020-05-20 VITALS — BP 132/72 | HR 56 | Temp 98.3°F | Resp 16 | Ht 75.0 in | Wt 186.0 lb

## 2020-05-20 DIAGNOSIS — M4716 Other spondylosis with myelopathy, lumbar region: Secondary | ICD-10-CM

## 2020-05-20 DIAGNOSIS — E785 Hyperlipidemia, unspecified: Secondary | ICD-10-CM

## 2020-05-20 DIAGNOSIS — K056 Periodontal disease, unspecified: Secondary | ICD-10-CM

## 2020-05-20 DIAGNOSIS — K029 Dental caries, unspecified: Secondary | ICD-10-CM

## 2020-05-20 DIAGNOSIS — I480 Paroxysmal atrial fibrillation: Secondary | ICD-10-CM

## 2020-05-20 DIAGNOSIS — E1169 Type 2 diabetes mellitus with other specified complication: Secondary | ICD-10-CM

## 2020-05-20 DIAGNOSIS — Z794 Long term (current) use of insulin: Secondary | ICD-10-CM

## 2020-05-20 DIAGNOSIS — E1165 Type 2 diabetes mellitus with hyperglycemia: Secondary | ICD-10-CM | POA: Diagnosis not present

## 2020-05-20 DIAGNOSIS — Z7901 Long term (current) use of anticoagulants: Secondary | ICD-10-CM

## 2020-05-20 LAB — POCT GLYCOSYLATED HEMOGLOBIN (HGB A1C): Hemoglobin A1C: 5.9 % — AB (ref 4.0–5.6)

## 2020-05-20 LAB — GLUCOSE, POCT (MANUAL RESULT ENTRY): POC Glucose: 110 mg/dl — AB (ref 70–99)

## 2020-05-20 MED ORDER — TRESIBA FLEXTOUCH 100 UNIT/ML ~~LOC~~ SOPN: 10.0000 [IU] | PEN_INJECTOR | Freq: Every day | SUBCUTANEOUS | 3 refills | Status: DC

## 2020-05-20 MED ORDER — ATORVASTATIN CALCIUM 10 MG PO TABS: 10.0000 mg | ORAL_TABLET | Freq: Every day | ORAL | 1 refills | Status: DC

## 2020-05-20 MED ORDER — METFORMIN HCL 1000 MG PO TABS: 1000.0000 mg | ORAL_TABLET | Freq: Two times a day (BID) | ORAL | 1 refills | Status: DC

## 2020-05-20 MED ORDER — APIXABAN 5 MG PO TABS: 5.0000 mg | ORAL_TABLET | Freq: Two times a day (BID) | ORAL | 1 refills | Status: DC

## 2020-05-20 MED ORDER — METOPROLOL TARTRATE 25 MG PO TABS: 12.5000 mg | ORAL_TABLET | Freq: Every day | ORAL | 1 refills | Status: DC

## 2020-05-20 NOTE — Assessment & Plan Note (Signed)
Chronic anticoagulation will maintain Eliquis

## 2020-05-20 NOTE — Assessment & Plan Note (Signed)
Patient status post dental care

## 2020-05-20 NOTE — Progress Notes (Signed)
Subjective:    Patient ID: Cameron Arroyo, male    DOB: 17-Dec-1950, 69 y.o.   MRN: [id number]  68 y.o.M post hosp f/u and to establish PCP When I entered the room this patient was extremely angry and I had to spend at least 10 minutes of the visit initially de-escalating the patient's anger.  The patient was accompanied by his spouse who also was very frustrated.  Their statement is that when the patient was admitted from the 21st and 24 September due to the COVID restrictions the spouse could not visit and the patient felt that he was getting conflicting information between various providers who saw him during his short visit.  Also the patient was to receive a post hospital visit with cardiology but that appointment did not appear to be made as of yet. The patient had a former primary care provider at the [facility] site however is wishing to establish care.  Has not been seen by primary care provider in 6 years.  During the hospitalization the patient was found to have new onset diabetes with hemoglobin A1c of 11.5.  He was to go home on Lantus but his insurance would not cover this so he switched to NPH 15 units in the morning and 5 units in the evening.  He is also on 500 mg once daily of metformin.  The patient brings a very complete record of all of his weights blood pressures heart rate and blood sugars.  His blood sugars appear to range anywhere from a low of 1 10-1 20 to a high of 1 70-1 80.  His morning sugars tend to run lower his afternoon sugars tend to run higher.  The patient's weight has gone down he did suffer significant weight loss in the past.  He is 189 pounds today and note his CBG today is 172  The patient did have polyuria and polydipsia this is improved somewhat but is still occurring.  The patient was admitted he had atrial fibrillation atrial flutter with rapid ventricular response and was hypotensive secondary to hypovolemia with associated hypermagnesemia  As the  patient's electrolytes and fluids were replaced and replenished the patient improved.  Once again he was sent home on the NPH and metformin as noted above.  The patient was also cardioverted during this hospitalization and placed on low-dose metoprolol along with Eliquis twice daily  Below is a copy of the discharge summary  Patient: Cameron Arroyo                                                         Admit date: 08/20/2019   DOB: [birth year]                                                              Discharge date:08/23/2019/11:20 AM UXN:[id number]  Discharge Diagnoses:   Principal Problem:   New onset type 2 diabetes mellitus ([place]) Active Problems:   Atrial flutter with rapid ventricular response (HCC)   Leukocytosis   Unintentional weight loss   Polyuria   Hypotension due to hypovolemia   Diabetes mellitus due to underlying condition, uncontrolled, with hyperglycemia ([place])   History of Present Illness/ Hospital Course Cameron Arroyo Summary: Cameron Jenkins Jonesis a 69 y.o.malewithno significant past medical history came to the emergency department with multiple problems. He reports that he feels like crap, polyuria, dry mouth, decreased appetite, weight loss, shortness of breath, anxiety, no energy since 2 to 3 weeks. He went to [facility] health urgent care initially where he was found to have atrial flutter with rapid response and was advised to go to the emergency department for further evaluation and management. He reports shortness of breath but denies association with chest pain, palpitation, headache, blurry vision, lightheadedness, dizziness, nausea, vomiting, epigastric pain, diarrhea, constipation, fever, chills, decreased appetite, night sweats, family history of thyroid issues, colon cancer, hematemesis, melena,  over-the-counter use of NSAIDs. Reports unintentional weight loss of 30 pounds in the last 1 year. He is a former smoker however denies alcohol, illicit drug use. He was prescribed steroids by his PCP recently for left knee pain. Patient reports that his left knee pain is better.   ED Course:Patient was found to have rapid narrow complex tachycardia suggestive of atrial flutter.Hyperglycemia of blood glucose 430, and leukocytosis of 12.7. Initial troponin x1-. Magnesium level elevated at 2.5. Chest x-ray came back negative. COVID-19: Pending. CBC: H&H is stable. Kidney function: WNL.[name] Kitchen Started on IV diltiazem and IV heparin.Transthoracic echo was ordered which was not performed due to patient's tachycardia.  Briefly patient was admitted with atrial flutter, status post TEE cardioversion which was successful on 08/22/2019 currently in normal sinus rhythm.  Per cardiology patient started on metoprolol and Eliquis.  Patient was noted from new onset diabetes mellitus with a hemoglobin A1c of 11.5.  Patient started on Lantus and metformin.  He was educated on diabetes, diet, instructed to check his blood sugars 3 times daily-4 times daily, keep a log for adjustment and titration of the medication for better glycemic control.  Patient was deemed stable was discharged home.   Discharge summary/Assessment & Plan:   Hyperglycemia/ newly diagnosed diabetesmellitus, POA: -Blood sugar more than 400 upon arrival -A1C 11.5 -Patient presented with polyuria, dry mouth, weight loss, fatigue -will start Lantus 20u / day and SSI/started on metformin 500 mg -Patient was given glucometer, instructed to check blood sugars 3 times daily-4 times daily, keep close log... Follow with PCP with adjustment of medication accordingly.  -Status post volume repletion -consult diabetes team for education; dietary following  Atrial flutter, asymptomatic, POA: -Patientasymptomatic regarding  arrhythmia -Patient heart rates more than 150s - trop neg and CXR negative - Thyroid function studyWNL - seen by cardiology, on IV heparin and IV diltiazem now- pendingTEE/cardioversionOn  08/22/19 -Cardioversion was successful, patient is currently in normal sinus rhythm -Per cardiology patient was started on metoprolol and Eliquis prescription were provided  Leukocytosis,reactive, resolved: -Likely reactive given recent steroid use, A. fib/flutter and hyperglycemia -Resolved no signs of infection.  Unintentional weight loss: -Reported loss of30 pounds in 1 year. -Likely in the setting of poorly controlled diabetes with A1c of 11.5 -Patient was instructed to keep an close log of his weight changes.   Code Status:Partial, DO NOT INTUBATE Family Communication:None present Disposition Plan: Patient will be discharged home, with close follow with  PCP and cardiologist in 1 to 2 weeks.  Consultants:  Cardiology  Procedures:  Status post tentative cardioversion on 08/22/2019 currently on normal sinus rhythm    Note the patient has also recently been seen by sports medicine physician in the [facility] office here in [place] who is referring him to Dr. Sherwood Gambler of neurosurgery along with an MRI because of severe low back pain.  Note the note the neurosurgeon had done cervical spine surgery in the past.  Below is the office note and plan from that visit Low back pain lumbar issue saw sports medicine 09/11/19: Assessment Note I had a long discussion with the patient. He is quite miserable today. I do believe this is all emanating from his lumbar spine. He is now greater than 6 weeks into conservative care and not improving at all. The prednisone did not help and he reports may have made him worse. I will place him on to gabapentin and titrate up slowly as tolerated. He will monitor for sedation. We will obtain an MRI scan of his lumbar spine. He would like to be  referred to Dr. Sherwood Gambler for further treatment. I will go and see if I can make the arrangements for him. He is quite frustrated with his pain and would like to see all of his options. All questions were encouraged and answered.   1. Low back pain . XR, lumbar spine  . MRI, lumbar spine, w/o contrast - mri lumbar spine r/o hnp  . magnetic resonance imaging (MRI): about this test . G-MRI Self Disclosure Notice . neurosurgery referral  2. Pain in left knee 3. Lumbar spondylosis with myelopathy . gabapentin 300 mg capsule    The patient also shared with me his diet plan and all of his meals are carefully written down by the spouse.  Note that he is eating a lot of potatoes and grits with his meals.  Note also the patient's PHQ 9 is extremely high during this visit and it is clear that he is experiencing some degree of frustration with healthcare system and has been experiencing quite a bit of anxiety and depression.   10/02/2019 This patient returns in follow-up for diabetes, atrial flutter, hyperlipidemia, lumbar iSpondylosis with myelopathy. At the last visit we obtained a lipid panel showing an LDL of 100 and elected to begin atorvastatin 10 mg daily.  The patient is yet to pick this up because when he was on a lipid medication 7 years ago he paid $3 a pill.  I explained to the patient this is now generic medicine and should not of been that expensive.  He wants to discuss the need for this with his cardiologist and his appointment which she has tomorrow morning.  The patient states he has been doing better with his blood glucoses and displays a very nice log.  His blood sugars run typically fasting 100-120 and postprandial and late in the afternoon will run as high as 170 but more recently with increases in his tresbia have been running closer to the 120-130 range The patient did decline at this visit a flu vaccine and pneumonia vaccine  Patient did go to podiatry and had his toenails  debrided and had a normal foot exam  The patient is seeing physical therapy for his chronic low back pain and as well has upcoming appointments with neurosurgery  The patient's question is can he come off the Eliquis should he require a surgical procedure on his lumbar spine I asked him  to discuss that with his cardiologist     12/15: Since the last visit the patient states his blood sugars have been in improved range and he brings a diary with him showing glucoses in the 100-120 range morning and evening.  There are no postprandial glucoses and today his glucose was 211 however this was after eating a bowl of grits before arriving to the clinic.  Blood pressures have been in great control and align with a blood pressure reading today 118/72.  Pulse rates been as low as 47 but in general was in the 50s to mid 60s on the atenolol.  Note hemoglobin A1c was 6.7 is a preop value before lumbar surgery.  He successfully underwent lumbar surgery per neurosurgery and is recovering from this.  He is not yet in physical therapy but has an upcoming post hospital appointment with the neurosurgeon.  He had been no episodes of palpitations at this time noted.  He states his fatigue is improved and his urination is improved as well.  He continues to be concerned about the cost of his a atorvastatin.  He is concerned when his deductible runs out his cost will be increased.  The patient does say his fatigue is better.  His lower back pain is improved from the surgery.  He has minimal edema in the ankles.  He is compliant with his insulin and Metformin program.  He has been monitoring his sugars.   01/22/2020 Since the last office visit the patient has shown continued improvement in blood pressure and diabetes control.  He brings his diary with him and most of his glucoses have been fasting in the 100 range and postprandial no higher than 140.  Occasionally with cheat meals he has been as high as 240  postprandial  Blood pressures also been good at home in the 110-120/70 range pulse is in the 60s and 70s.  He denies any palpitations or increased shortness of breath.  He does have a history of paroxysmal atrial fibrillation and has a chads vascular 2 score and he has since met with cardiology and they recommend that he continue the Eliquis for now that he stops that he is high risk for stroke unless he undergoes a catheter ablation.  The patient tells me today he does not wish to undergo a catheter ablation.  Patient is maintaining atorvastatin 10 mg daily and also Tresiba at 15 units daily of Metformin at 1000 mg twice daily.  He has lumbar disease is improved status post surgery.  Patient does complain of bilateral hearing loss and as well complains of severe dentition issues needing further dental care  05/20/2020 Patient is seen in return follow-up for paroxysmal atrial fibrillation type 2 diabetes dental disease hyperlipidemia.  Also has hypertension.  Note the patient is much improved.  He has had good blood pressure control on his diary.  Note his blood glucoses have been in the 100-1 ten range and some of the even been in the ninety range.  He is on the [place] 15 units daily along with Metformin at 1000 mg twice daily and also maintains metoprolol at 12-1/2 mg daily maintains Lipitor at 10 mg daily as well.  The patient has been reducing his Evaristo Bury dosing that he skipped some doses when his evening blood glucose is in the seventy range.  His hemoglobin A1c today is 5.9.  Patient has no other new complaints.  He did have a colonoscopy which showed polyps to have a recall in 3  years none of the polyps were cancerous.  He does maintain Eliquis twice daily. The patient did receive his eye exam as required. The patient also is due a colonoscopy his last one was over 10 years ago and he did have polyps at that time therefore will make a referral to gastroenterology for the polyp exam  Patient  also knows an ophthalmology exam is needed this year for his retinal evaluations  The patient is yet to receive his Covid vaccine.    Past Medical History:  Diagnosis Date  . Arthritis   . Asthma    as child  . Atrial flutter with rapid ventricular response ([place]) 08/20/2019  . Lumbar disc herniation 11/01/2019  . New onset type 2 diabetes mellitus ([place]) 08/20/2019  . Pneumonia    history of     Family History  Problem Relation Age of Onset  . Cirrhosis Mother   . Cancer Father      Social History   Socioeconomic History  . Marital status: Married    Spouse name: Not on file  . Number of children: Not on file  . Years of education: Not on file  . Highest education level: Not on file  Occupational History  . Not on file  Tobacco Use  . Smoking status: Former Smoker    Quit date: 11/29/1992    Years since quitting: 27.4  . Smokeless tobacco: Never Used  Vaping Use  . Vaping Use: Never used  Substance and Sexual Activity  . Alcohol use: No  . Drug use: Never  . Sexual activity: Not Currently  Other Topics Concern  . Not on file  Social History Narrative  . Not on file   Social Determinants of Health   Financial Resource Strain:   . Difficulty of Paying Living Expenses:   Food Insecurity:   . Worried About Charity fundraiser in the Last Year:   . Arboriculturist in the Last Year:   Transportation Needs:   . Film/video editor (Medical):   [name] Kitchen Lack of Transportation (Non-Medical):   Physical Activity:   . Days of Exercise per Week:   . Minutes of Exercise per Session:   Stress:   . Feeling of Stress :   Social Connections:   . Frequency of Communication with Friends and Family:   . Frequency of Social Gatherings with Friends and Family:   . Attends Religious Services:   . Active Member of Clubs or Organizations:   . Attends Archivist Meetings:   [name] Kitchen Marital Status:   Intimate Partner Violence:   . Fear of Current or Ex-Partner:   .  Emotionally Abused:   [name] Kitchen Physically Abused:   . Sexually Abused:      No Known Allergies   Outpatient Medications Prior to Visit  Medication Sig Dispense Refill  . atorvastatin (LIPITOR) 10 MG tablet TAKE 1 TABLET BY MOUTH EVERY DAY 90 tablet 0  . ELIQUIS 5 MG TABS tablet TAKE 1 TABLET BY MOUTH TWICE A DAY 180 tablet 1  . metFORMIN (GLUCOPHAGE) 1000 MG tablet Take 1 tablet (1,000 mg total) by mouth 2 (two) times daily with a meal. 180 tablet 1  . metoprolol tartrate (LOPRESSOR) 25 MG tablet TAKE 1/2 TABLET BY MOUTH TWICE A DAY (Patient taking differently: daily. ) 90 tablet 0  . TRESIBA FLEXTOUCH 100 UNIT/ML SOPN FlexTouch Pen INJECT 15 UNITS TOTAL INTO THE SKIN AT BEDTIME. 15 mL 1  . omeprazole (PRILOSEC) 20 MG capsule  Take 1 capsule (20 mg total) by mouth daily. (Patient not taking: Reported on 05/20/2020) 90 capsule 1   No facility-administered medications prior to visit.      Review of Systems  Pos in BOLD Constitutional:     weight loss, night sweats,  Fevers, chills, fatigue, lassitude. HEENT:   No headaches,  Difficulty swallowing,  Tooth/dental problems,  Sore throat,                No sneezing, itching, ear ache, nasal congestion, post nasal drip,   CV:  No chest pain,  Orthopnea, PND, swelling in lower extremities, anasarca, dizziness, palpitations  GI  No heartburn, indigestion, abdominal pain, nausea, vomiting, diarrhea, change in bowel habits, loss of appetite  Resp: No shortness of breath with exertion or at rest.  No excess mucus, no productive cough,  No non-productive cough,  No coughing up of blood.  No change in color of mucus.  No wheezing.  No chest wall deformity  Skin: no rash or lesions.  GU: no dysuria, change in color of urine, no urgency or frequency.  No flank pain. polyuria  MS:  No joint pain or swelling.  No decreased range of motion.   back pain.    Psych:   change in mood or affect.  depression or anxiety.  No memory loss.     Objective:    Physical Exam Vitals:   05/20/20 0925 05/20/20 1005  BP: (!) 143/78 132/72  Pulse: (!) 56   Resp: 16   Temp: 98.3 F (36.8 C)   SpO2: 99%   Weight: 186 lb (84.4 kg)   Height: '6\' 3"'$  (1.905 m)     Wt Readings from Last 3 Encounters:  05/20/20 186 lb (84.4 kg)  02/19/20 188 lb (85.3 kg)  02/07/20 188 lb 6 oz (85.4 kg)    Gen: The patient is in much better spirits today and is in no acute distress weight has stabilized  ENT: No lesions,  mouth clear,  oropharynx clear, no postnasal drip Severe periodontal disease and carious teeth but somewhat improved from the last visit  Neck: No JVD, no TMG, no carotid bruits  Lungs: No use of accessory muscles, no dullness to percussion, clear without rales or rhonchi  Cardiovascular: RRR, heart sounds normal, no murmur or gallops, no peripheral edema  Abdomen: soft and NT, no HSM,  BS normal  Musculoskeletal: No deformities, no cyanosis or clubbing  Neuro: alert, non focal  Skin: Warm, no lesions or rashes  CHA2DS2/VAS Stroke Risk Points  Current as of 2 hours ago     2 >= 2 Points: High Risk  1 - 1.99 Points: Medium Risk  0 Points: Low Risk    The patient's score has not changed in the past year.: No Change     Details    This score determines the patient's risk of having a stroke if the  patient has atrial fibrillation.       Points Metrics  0 Has Congestive Heart Failure:  No    Current as of 2 hours ago  0 Has Vascular Disease:  No    Current as of 2 hours ago  0 Has Hypertension:  No    Current as of 2 hours ago  1 Age:  25    Current as of 2 hours ago  1 Has Diabetes:  Yes    Current as of 2 hours ago  0 Had Stroke:  No  Had TIA:  No  Had thromboembolism:  No    Current as of 2 hours ago  0 Male:  No    Current as of 2 hours ago    CXR 9/21: NAD  TEE: FINDINGS:  LEFT VENTRICLE: EF = 50-55%  RIGHT VENTRICLE: Normal size and function.   LEFT ATRIUM: No thrombus/mass.  LEFT ATRIAL APPENDAGE: No  thrombus/mass.   RIGHT ATRIUM: No thrombus/mass.  AORTIC VALVE:  Trileaflet. No regurgitation.   MITRAL VALVE:    Normal structure. Mild regurgitation.   TRICUSPID VALVE: Normal structure. No regurgitation.   PULMONIC VALVE: Grossly normal structure. No regurgitation. No apparent vegetation.  INTERATRIAL SEPTUM: No PFO or ASD seen by color Doppler.  PERICARDIUM: No effusion noted.  DESCENDING AORTA: Mild plaque    CARDIOVERSION:     Indications:  Symptomatic Atrial Flutter  Procedure Details:  Once the TEE was complete, the patient had the defibrillator pads placed in the anterior and posterior position. Once an appropriate level of sedation was confirmed, the patient was cardioverted x 1 with 100J of biphasic synchronized energy.  The patient converted to NSR.  There were no apparent complications.  The patient had normal neuro status and respiratory status post procedure with vitals stable as recorded elsewhere.  Adequate airway was maintained throughout and vital signs monitored per protocol.   Lab Results  Component Value Date   HGBA1C 5.9 (A) 05/20/2020   BMP Latest Ref Rng & Units 10/30/2019 09/11/2019 08/23/2019  Glucose 70 - 99 mg/dL 132(H) 144(H) 143(H)  BUN 8 - 23 mg/dL '17 14 17  '$ Creatinine 0.61 - 1.24 mg/dL 0.83 0.88 0.86  BUN/Creat Ratio 10 - 24 - 16 -  Sodium 135 - 145 mmol/L 141 141 137  Potassium 3.5 - 5.1 mmol/L 4.1 4.2 3.6  Chloride 98 - 111 mmol/L 105 106 106  CO2 22 - 32 mmol/L '23 22 24  '$ Calcium 8.9 - 10.3 mg/dL 9.1 9.2 8.4(L)   Hepatic Function Latest Ref Rng & Units 09/11/2019 08/21/2019  Total Protein 6.0 - 8.5 g/dL 6.1 5.2(L)  Albumin 3.8 - 4.8 g/dL 4.4 3.0(L)  AST 0 - 40 IU/L 17 15  ALT 0 - 44 IU/L 26 21  Alk Phosphatase 39 - 117 IU/L 65 66  Total Bilirubin 0.0 - 1.2 mg/dL 0.4 1.3(H)   CBC Latest Ref Rng & Units 10/30/2019 08/23/2019 08/22/2019  WBC 4.0 - 10.5 K/uL 5.8 8.3 7.3  Hemoglobin 13.0 - 17.0 g/dL 14.3 13.4 13.3  Hematocrit  39 - 52 % 43.9 37.7(L) 39.1  Platelets 150 - 400 K/uL 213 115(L) 131(L)   Lipid Panel     Component Value Date/Time   CHOL 168 09/11/2019 1145   TRIG 153 (H) 09/11/2019 1145   HDL 41 09/11/2019 1145   CHOLHDL 4.1 09/11/2019 1145   LDLCALC 100 (H) 09/11/2019 1145   LABVLDL 27 09/11/2019 1145    Hep C neg Microalbumin Normal      Assessment & Plan:  I personally reviewed all images and lab data in the [facility] system as well as any outside material available during this office visit and agree with the  radiology impressions.   PAF (paroxysmal atrial fibrillation) (HCC) History of paroxysmal atrial flutter now maintaining sinus rhythm on metoprolol 12.5 mg daily and does maintain Eliquis  Dental caries Dental caries and periodontal disease has been addressed by dentistry  Periodontal disease Patient status post dental care  Hyperlipidemia associated with type 2 diabetes mellitus ([place]) Continue low-dose atorvastatin  Type 2 diabetes mellitus, controlled (  [place]) Type 2 diabetes now under excellent control we will reduce Tresiba to 10 units daily continue Metformin at 1000 mg twice daily  Lumbar spondylosis with myelopathy Patient status post lumbar surgery per neurosurgery with good result and good pain control  Chronic anticoagulation Chronic anticoagulation will maintain Eliquis   Diagnoses and all orders for this visit:  Controlled type 2 diabetes mellitus with hyperglycemia, with long-term current use of insulin (HCC) -     HgB A1c -     POCT glucose (manual entry)  PAF (paroxysmal atrial fibrillation) (HCC)  Dental caries  Periodontal disease  Hyperlipidemia associated with type 2 diabetes mellitus (HCC)  Lumbar spondylosis with myelopathy  Chronic anticoagulation  Other orders -     insulin degludec (TRESIBA FLEXTOUCH) 100 UNIT/ML FlexTouch Pen; Inject 0.1 mLs (10 Units total) into the skin daily. -     atorvastatin (LIPITOR) 10 MG tablet; Take 1 tablet (10  mg total) by mouth daily. -     apixaban (ELIQUIS) 5 MG TABS tablet; Take 1 tablet (5 mg total) by mouth 2 (two) times daily. -     metoprolol tartrate (LOPRESSOR) 25 MG tablet; Take 0.5 tablets (12.5 mg total) by mouth daily. -     metFORMIN (GLUCOPHAGE) 1000 MG tablet; Take 1 tablet (1,000 mg total) by mouth 2 (two) times daily with a meal.  Note colonoscopy has been achieved  Note eye exam was achieved  Patient is reluctant to receive the Covid vaccine we gave him information regarding this for his consideration

## 2020-05-20 NOTE — Assessment & Plan Note (Signed)
Dental caries and periodontal disease has been addressed by dentistry

## 2020-05-20 NOTE — Patient Instructions (Addendum)
Please consider a Covid vaccine  COVID-19 Vaccine Information can be found at: [email or web address] For questions related to vaccine distribution or appointments, please email [email or web address] or call [phone].    Reduce Tresiba to 10 units daily No other changes Return in 4 months

## 2020-05-20 NOTE — Assessment & Plan Note (Signed)
Continue low dose atorvastatin  

## 2020-05-20 NOTE — Assessment & Plan Note (Signed)
History of paroxysmal atrial flutter now maintaining sinus rhythm on metoprolol 12.5 mg daily and does maintain Eliquis

## 2020-05-20 NOTE — Assessment & Plan Note (Signed)
Patient status post lumbar surgery per neurosurgery with good result and good pain control

## 2020-05-20 NOTE — Assessment & Plan Note (Signed)
Type 2 diabetes now under excellent control we will reduce Tresiba to 10 units daily continue Metformin at 1000 mg twice daily

## 2020-05-20 NOTE — Progress Notes (Signed)
Here for rputine DM check up

## 2020-09-18 ENCOUNTER — Ambulatory Visit: Payer: 59 | Admitting: Critical Care Medicine

## 2020-10-01 ENCOUNTER — Encounter: Payer: Self-pay | Admitting: Critical Care Medicine

## 2020-10-01 ENCOUNTER — Ambulatory Visit: Payer: 59 | Attending: Critical Care Medicine | Admitting: Critical Care Medicine

## 2020-10-01 ENCOUNTER — Other Ambulatory Visit: Payer: Self-pay

## 2020-10-01 VITALS — BP 123/72 | HR 53 | Temp 97.3°F | Resp 17 | Wt 186.0 lb

## 2020-10-01 DIAGNOSIS — E1165 Type 2 diabetes mellitus with hyperglycemia: Secondary | ICD-10-CM

## 2020-10-01 DIAGNOSIS — E785 Hyperlipidemia, unspecified: Secondary | ICD-10-CM

## 2020-10-01 DIAGNOSIS — Z130 Encounter for screening for diseases of the blood and blood-forming organs and certain disorders involving the immune mechanism: Secondary | ICD-10-CM

## 2020-10-01 DIAGNOSIS — B351 Tinea unguium: Secondary | ICD-10-CM | POA: Diagnosis not present

## 2020-10-01 DIAGNOSIS — Z794 Long term (current) use of insulin: Secondary | ICD-10-CM | POA: Diagnosis not present

## 2020-10-01 DIAGNOSIS — I480 Paroxysmal atrial fibrillation: Secondary | ICD-10-CM

## 2020-10-01 DIAGNOSIS — E1169 Type 2 diabetes mellitus with other specified complication: Secondary | ICD-10-CM

## 2020-10-01 DIAGNOSIS — K056 Periodontal disease, unspecified: Secondary | ICD-10-CM

## 2020-10-01 LAB — POCT GLYCOSYLATED HEMOGLOBIN (HGB A1C): Hemoglobin A1C: 5.9 % — AB (ref 4.0–5.6)

## 2020-10-01 LAB — GLUCOSE, POCT (MANUAL RESULT ENTRY): POC Glucose: 105 mg/dl — AB (ref 70–99)

## 2020-10-01 MED ORDER — TRESIBA FLEXTOUCH 100 UNIT/ML ~~LOC~~ SOPN: 10.0000 [IU] | PEN_INJECTOR | Freq: Every day | SUBCUTANEOUS | 3 refills | Status: DC

## 2020-10-01 MED ORDER — METFORMIN HCL 1000 MG PO TABS: 1000.0000 mg | ORAL_TABLET | Freq: Two times a day (BID) | ORAL | 1 refills | Status: DC

## 2020-10-01 MED ORDER — BD PEN NEEDLE NANO 2ND GEN 32G X 4 MM MISC: 11 refills | Status: DC

## 2020-10-01 MED ORDER — ATORVASTATIN CALCIUM 10 MG PO TABS: 10.0000 mg | ORAL_TABLET | Freq: Every day | ORAL | 1 refills | Status: DC

## 2020-10-01 MED ORDER — METOPROLOL TARTRATE 25 MG PO TABS: 12.5000 mg | ORAL_TABLET | Freq: Every day | ORAL | 1 refills | Status: DC

## 2020-10-01 MED ORDER — APIXABAN 5 MG PO TABS
5.0000 mg | ORAL_TABLET | Freq: Two times a day (BID) | ORAL | 1 refills | Status: DC
Start: 2020-10-01 — End: 2021-01-27

## 2020-10-01 NOTE — Assessment & Plan Note (Signed)
Will refer back to podiatry to reevaluate toenails

## 2020-10-01 NOTE — Addendum Note (Signed)
Addended by: Carylon Perches on: 10/01/2020 11:50 AM   Modules accepted: Orders

## 2020-10-01 NOTE — Assessment & Plan Note (Signed)
Paroxysmal atrial fibrillation stable at this time continue low-dose metoprolol and Eliquis due to high CHA2DS2-VASc 2 score follow-up with cardiology

## 2020-10-01 NOTE — Patient Instructions (Signed)
No change in medications  You declined the flu vaccine and the Covid vaccine  Urine sample for microalbumin and blood work today to check blood counts liver kidney function lipid panel for your cholesterol  Return to see Dr. Joya Gaskins 4 months  A foot doctor referral be made for your toenails

## 2020-10-01 NOTE — Assessment & Plan Note (Signed)
Type 2 diabetes good control A1c now 5.9 we will check creatinine for nephropathy screening and also microalbumin

## 2020-10-01 NOTE — Assessment & Plan Note (Signed)
Continue low-dose statin and check lipid levels today

## 2020-10-01 NOTE — Progress Notes (Signed)
Subjective:    Patient ID: Cameron Arroyo, male    DOB: 01-May-1951, 69 y.o.   MRN: [id number]  68 y.o.M post hosp f/u and to establish PCP  09/11/19 When I entered the room this patient was extremely angry and I had to spend at least 10 minutes of the visit initially de-escalating the patient's anger.  The patient was accompanied by his spouse who also was very frustrated.  Their statement is that when the patient was admitted from the 21st and 24 September due to the COVID restrictions the spouse could not visit and the patient felt that he was getting conflicting information between various providers who saw him during his short visit.  Also the patient was to receive a post hospital visit with cardiology but that appointment did not appear to be made as of yet. The patient had a former primary care provider at the [facility] site however is wishing to establish care.  Has not been seen by primary care provider in 6 years.  During the hospitalization the patient was found to have new onset diabetes with hemoglobin A1c of 11.5.  He was to go home on Lantus but his insurance would not cover this so he switched to NPH 15 units in the morning and 5 units in the evening.  He is also on 500 mg once daily of metformin.  The patient brings a very complete record of all of his weights blood pressures heart rate and blood sugars.  His blood sugars appear to range anywhere from a low of 1 10-1 20 to a high of 1 70-1 80.  His morning sugars tend to run lower his afternoon sugars tend to run higher.  The patient's weight has gone down he did suffer significant weight loss in the past.  He is 189 pounds today and note his CBG today is 172  The patient did have polyuria and polydipsia this is improved somewhat but is still occurring.  The patient was admitted he had atrial fibrillation atrial flutter with rapid ventricular response and was hypotensive secondary to hypovolemia with associated  hypermagnesemia  As the patient's electrolytes and fluids were replaced and replenished the patient improved.  Once again he was sent home on the NPH and metformin as noted above.  The patient was also cardioverted during this hospitalization and placed on low-dose metoprolol along with Eliquis twice daily  Below is a copy of the discharge summary  Patient: Cameron Arroyo                                                         Admit date: 08/20/2019   DOB: 19-Apr-1951                                                              Discharge date:08/23/2019/11:20 AM ZYS:[id number]  Discharge Diagnoses:   Principal Problem:   New onset type 2 diabetes mellitus ([place]) Active Problems:   Atrial flutter with rapid ventricular response (HCC)   Leukocytosis   Unintentional weight loss   Polyuria   Hypotension due to hypovolemia   Diabetes mellitus due to underlying condition, uncontrolled, with hyperglycemia ([place])   History of Present Illness/ Hospital Course [name] Summary: Cameron Jenkins Jonesis a 69 y.o.malewithno significant past medical history came to the emergency department with multiple problems. He reports that he feels like crap, polyuria, dry mouth, decreased appetite, weight loss, shortness of breath, anxiety, no energy since 2 to 3 weeks. He went to [facility] health urgent care initially where he was found to have atrial flutter with rapid response and was advised to go to the emergency department for further evaluation and management. He reports shortness of breath but denies association with chest pain, palpitation, headache, blurry vision, lightheadedness, dizziness, nausea, vomiting, epigastric pain, diarrhea, constipation, fever, chills, decreased appetite, night sweats, family history of thyroid issues, colon  cancer, hematemesis, melena, over-the-counter use of NSAIDs. Reports unintentional weight loss of 30 pounds in the last 1 year. He is a former smoker however denies alcohol, illicit drug use. He was prescribed steroids by his PCP recently for left knee pain. Patient reports that his left knee pain is better.   ED Course:Patient was found to have rapid narrow complex tachycardia suggestive of atrial flutter.Hyperglycemia of blood glucose 430, and leukocytosis of 12.7. Initial troponin x1-. Magnesium level elevated at 2.5. Chest x-ray came back negative. COVID-19: Pending. CBC: H&H is stable. Kidney function: WNL.[name] Kitchen Started on IV diltiazem and IV heparin.Transthoracic echo was ordered which was not performed due to patient's tachycardia.  Briefly patient was admitted with atrial flutter, status post TEE cardioversion which was successful on 08/22/2019 currently in normal sinus rhythm.  Per cardiology patient started on metoprolol and Eliquis.  Patient was noted from new onset diabetes mellitus with a hemoglobin A1c of 11.5.  Patient started on Lantus and metformin.  He was educated on diabetes, diet, instructed to check his blood sugars 3 times daily-4 times daily, keep a log for adjustment and titration of the medication for better glycemic control.  Patient was deemed stable was discharged home.   Discharge summary/Assessment & Plan:   Hyperglycemia/ newly diagnosed diabetesmellitus, POA: -Blood sugar more than 400 upon arrival -A1C 11.5 -Patient presented with polyuria, dry mouth, weight loss, fatigue -will start Lantus 20u / day and SSI/started on metformin 500 mg -Patient was given glucometer, instructed to check blood sugars 3 times daily-4 times daily, keep close log... Follow with PCP with adjustment of medication accordingly.  -Status post volume repletion -consult diabetes team for education; dietary following  Atrial flutter, asymptomatic,  POA: -Patientasymptomatic regarding arrhythmia -Patient heart rates more than 150s - trop neg and CXR negative - Thyroid function studyWNL - seen by cardiology, on IV heparin and IV diltiazem now- pendingTEE/cardioversionOn  08/22/19 -Cardioversion was successful, patient is currently in normal sinus rhythm -Per cardiology patient was started on metoprolol and Eliquis prescription were provided  Leukocytosis,reactive, resolved: -Likely reactive given recent steroid use, A. fib/flutter and hyperglycemia -Resolved no signs of infection.  Unintentional weight loss: -Reported loss of30 pounds in 1 year. -Likely in the setting of poorly controlled diabetes with A1c of 11.5 -Patient was instructed to keep an close log of his weight changes.   Code Status:Partial, DO NOT INTUBATE Family Communication:None present Disposition Plan: Patient will be discharged home, with close follow with  PCP and cardiologist in 1 to 2 weeks.  Consultants:  Cardiology  Procedures:  Status post tentative cardioversion on 08/22/2019 currently on normal sinus rhythm    Note the patient has also recently been seen by sports medicine physician in the [facility] here in [place] who is referring him to Dr. Sherwood Gambler of neurosurgery along with an MRI because of severe low back pain.  Note the note the neurosurgeon had done cervical spine surgery in the past.  Below is the office note and plan from that visit Low back pain lumbar issue saw sports medicine 09/11/19: Assessment Note I had a long discussion with the patient. He is quite miserable today. I do believe this is all emanating from his lumbar spine. He is now greater than 6 weeks into conservative care and not improving at all. The prednisone did not help and he reports may have made him worse. I will place him on to gabapentin and titrate up slowly as tolerated. He will monitor for sedation. We will obtain an MRI scan of his  lumbar spine. He would like to be referred to Dr. Sherwood Gambler for further treatment. I will go and see if I can make the arrangements for him. He is quite frustrated with his pain and would like to see all of his options. All questions were encouraged and answered.   1. Low back pain . XR, lumbar spine  . MRI, lumbar spine, w/o contrast - mri lumbar spine r/o hnp  . magnetic resonance imaging (MRI): about this test . G-MRI Self Disclosure Notice . neurosurgery referral  2. Pain in left knee 3. Lumbar spondylosis with myelopathy . gabapentin 300 mg capsule    The patient also shared with me his diet plan and all of his meals are carefully written down by the spouse.  Note that he is eating a lot of potatoes and grits with his meals.  Note also the patient's PHQ 9 is extremely high during this visit and it is clear that he is experiencing some degree of frustration with healthcare system and has been experiencing quite a bit of anxiety and depression.   10/02/2019 This patient returns in follow-up for diabetes, atrial flutter, hyperlipidemia, lumbar iSpondylosis with myelopathy. At the last visit we obtained a lipid panel showing an LDL of 100 and elected to begin atorvastatin 10 mg daily.  The patient is yet to pick this up because when he was on a lipid medication 7 years ago he paid $3 a pill.  I explained to the patient this is now generic medicine and should not of been that expensive.  He wants to discuss the need for this with his cardiologist and his appointment which she has tomorrow morning.  The patient states he has been doing better with his blood glucoses and displays a very nice log.  His blood sugars run typically fasting 100-120 and postprandial and late in the afternoon will run as high as 170 but more recently with increases in his tresbia have been running closer to the 120-130 range The patient did decline at this visit a flu vaccine and pneumonia vaccine  Patient did go to  podiatry and had his toenails debrided and had a normal foot exam  The patient is seeing physical therapy for his chronic low back pain and as well has upcoming appointments with neurosurgery  The patient's question is can he come off the Eliquis should he require a surgical procedure on his lumbar spine I asked him  to discuss that with his cardiologist     12/15: Since the last visit the patient states his blood sugars have been in improved range and he brings a diary with him showing glucoses in the 100-120 range morning and evening.  There are no postprandial glucoses and today his glucose was 211 however this was after eating a bowl of grits before arriving to the clinic.  Blood pressures have been in great control and align with a blood pressure reading today 118/72.  Pulse rates been as low as 47 but in general was in the 50s to mid 60s on the atenolol.  Note hemoglobin A1c was 6.7 is a preop value before lumbar surgery.  He successfully underwent lumbar surgery per neurosurgery and is recovering from this.  He is not yet in physical therapy but has an upcoming post hospital appointment with the neurosurgeon.  He had been no episodes of palpitations at this time noted.  He states his fatigue is improved and his urination is improved as well.  He continues to be concerned about the cost of his a atorvastatin.  He is concerned when his deductible runs out his cost will be increased.  The patient does say his fatigue is better.  His lower back pain is improved from the surgery.  He has minimal edema in the ankles.  He is compliant with his insulin and Metformin program.  He has been monitoring his sugars.   01/22/2020 Since the last office visit the patient has shown continued improvement in blood pressure and diabetes control.  He brings his diary with him and most of his glucoses have been fasting in the 100 range and postprandial no higher than 140.  Occasionally with cheat meals he has been as  high as 240 postprandial  Blood pressures also been good at home in the 110-120/70 range pulse is in the 60s and 70s.  He denies any palpitations or increased shortness of breath.  He does have a history of paroxysmal atrial fibrillation and has a chads vascular 2 score and he has since met with cardiology and they recommend that he continue the Eliquis for now that he stops that he is high risk for stroke unless he undergoes a catheter ablation.  The patient tells me today he does not wish to undergo a catheter ablation.  Patient is maintaining atorvastatin 10 mg daily and also Tresiba at 15 units daily of Metformin at 1000 mg twice daily.  He has lumbar disease is improved status post surgery.  Patient does complain of bilateral hearing loss and as well complains of severe dentition issues needing further dental care  05/20/2020 Patient is seen in return follow-up for paroxysmal atrial fibrillation type 2 diabetes dental disease hyperlipidemia.  Also has hypertension.  Note the patient is much improved.  He has had good blood pressure control on his diary.  Note his blood glucoses have been in the 100-1 ten range and some of the even been in the ninety range.  He is on the [place] 15 units daily along with Metformin at 1000 mg twice daily and also maintains metoprolol at 12-1/2 mg daily maintains Lipitor at 10 mg daily as well.  The patient has been reducing his Tyler Aas dosing that he skipped some doses when his evening blood glucose is in the seventy range.  His hemoglobin A1c today is 5.9.  Patient has no other new complaints.  He did have a colonoscopy which showed polyps to have a recall in 3  years none of the polyps were cancerous.  He does maintain Eliquis twice daily. The patient did receive his eye exam as required. The patient also is due a colonoscopy his last one was over 10 years ago and he did have polyps at that time therefore will make a referral to gastroenterology for the polyp  exam  Patient also knows an ophthalmology exam is needed this year for his retinal evaluations  The patient is yet to receive his Covid vaccine.  10/01/2020 This patient returns in follow-up and is doing quite well with adherence to his atrial fibrillation medications and diabetic care.  On arrival blood pressure is excellent at 123/72 BMI is 23 and is blood sugar is 105 with an A1c of 5.9  The patient's had no recent new complaints.  He had questions about the flow of the insulin through his needle which I addressed also questions regarding blood pressure and blood glucose associations which I also addressed  Patient complains of some thickening toenails in the left great toenail of the foot and he is due today for a foot exam  He has severe periodontal disease and has yet to achieve dental care he is continuing daily cleansing of the teeth  The patient adamantly refuses Covid vaccine and flu vaccines   Past Medical History:  Diagnosis Date  . Arthritis   . Asthma    as child  . Atrial flutter with rapid ventricular response ([place]) 08/20/2019  . Lumbar disc herniation 11/01/2019  . New onset type 2 diabetes mellitus ([place]) 08/20/2019  . Pneumonia    history of     Family History  Problem Relation Age of Onset  . Cirrhosis Mother   . Cancer Father      Social History   Socioeconomic History  . Marital status: Married    Spouse name: Not on file  . Number of children: Not on file  . Years of education: Not on file  . Highest education level: Not on file  Occupational History  . Not on file  Tobacco Use  . Smoking status: Former Smoker    Quit date: 11/29/1992    Years since quitting: 27.8  . Smokeless tobacco: Never Used  Vaping Use  . Vaping Use: Never used  Substance and Sexual Activity  . Alcohol use: No  . Drug use: Never  . Sexual activity: Not Currently  Other Topics Concern  . Not on file  Social History Narrative  . Not on file   Social Determinants of  Health   Financial Resource Strain:   . Difficulty of Paying Living Expenses: Not on file  Food Insecurity:   . Worried About Charity fundraiser in the Last Year: Not on file  . Ran Out of Food in the Last Year: Not on file  Transportation Needs:   . Lack of Transportation (Medical): Not on file  . Lack of Transportation (Non-Medical): Not on file  Physical Activity:   . Days of Exercise per Week: Not on file  . Minutes of Exercise per Session: Not on file  Stress:   . Feeling of Stress : Not on file  Social Connections:   . Frequency of Communication with Friends and Family: Not on file  . Frequency of Social Gatherings with Friends and Family: Not on file  . Attends Religious Services: Not on file  . Active Member of Clubs or Organizations: Not on file  . Attends Archivist Meetings: Not on file  .  Marital Status: Not on file  Intimate Partner Violence:   . Fear of Current or Ex-Partner: Not on file  . Emotionally Abused: Not on file  . Physically Abused: Not on file  . Sexually Abused: Not on file     No Known Allergies   Outpatient Medications Prior to Visit  Medication Sig Dispense Refill  . apixaban (ELIQUIS) 5 MG TABS tablet Take 1 tablet (5 mg total) by mouth 2 (two) times daily. 180 tablet 1  . atorvastatin (LIPITOR) 10 MG tablet Take 1 tablet (10 mg total) by mouth daily. 90 tablet 1  . BD PEN NEEDLE NANO 2ND GEN 32G X 4 MM MISC Use with Tyler Aas Flextouch    . insulin degludec (TRESIBA FLEXTOUCH) 100 UNIT/ML FlexTouch Pen Inject 0.1 mLs (10 Units total) into the skin daily. 15 mL 3  . metFORMIN (GLUCOPHAGE) 1000 MG tablet Take 1 tablet (1,000 mg total) by mouth 2 (two) times daily with a meal. 180 tablet 1  . metoprolol tartrate (LOPRESSOR) 25 MG tablet Take 0.5 tablets (12.5 mg total) by mouth daily. 60 tablet 1   No facility-administered medications prior to visit.      Review of Systems  Pos in BOLD Constitutional:     weight loss, night sweats,   Fevers, chills, fatigue, lassitude. HEENT:   No headaches,  Difficulty swallowing,  Tooth/dental problems,  Sore throat,                No sneezing, itching, ear ache, nasal congestion, post nasal drip,   CV:  No chest pain,  Orthopnea, PND, swelling in lower extremities, anasarca, dizziness, palpitations  GI  No heartburn, indigestion, abdominal pain, nausea, vomiting, diarrhea, change in bowel habits, loss of appetite  Resp: No shortness of breath with exertion or at rest.  No excess mucus, no productive cough,  No non-productive cough,  No coughing up of blood.  No change in color of mucus.  No wheezing.  No chest wall deformity  Skin: no rash or lesions.  GU: no dysuria, change in color of urine, no urgency or frequency.  No flank pain. polyuria  MS:  No joint pain or swelling.  No decreased range of motion.   back pain.    Psych:   change in mood or affect.  depression or anxiety.  No memory loss.     Objective:   Physical Exam Vitals:   10/01/20 0849  BP: 123/72  Pulse: (!) 53  Resp: 17  Temp: (!) 97.3 F (36.3 C)  TempSrc: Temporal  SpO2: 100%  Weight: 186 lb (84.4 kg)    Wt Readings from Last 3 Encounters:  10/01/20 186 lb (84.4 kg)  05/20/20 186 lb (84.4 kg)  02/19/20 188 lb (85.3 kg)    Gen: The patient is in much better spirits today and is in no acute distress weight has stabilized  ENT: No lesions,  mouth clear,  oropharynx clear, no postnasal drip Severe periodontal disease and carious teeth but somewhat improved from the last visit  Neck: No JVD, no TMG, no carotid bruits  Lungs: No use of accessory muscles, no dullness to percussion, clear without rales or rhonchi  Cardiovascular: RRR, heart sounds normal, no murmur or gallops, no peripheral edema  Abdomen: soft and NT, no HSM,  BS normal  Musculoskeletal: No deformities, no cyanosis or clubbing  Neuro: alert, non focal  Skin: Warm, no lesions or rashes   Foot exam pulses intact there is  onychomycosis of both great  toenails of both feet sensation is intact no other lesions seen  CHA2DS2/VAS Stroke Risk Points  Current as of 2 hours ago     2 >= 2 Points: High Risk  1 - 1.99 Points: Medium Risk  0 Points: Low Risk    The patient's score has not changed in the past year.: No Change     Details    This score determines the patient's risk of having a stroke if the  patient has atrial fibrillation.       Points Metrics  0 Has Congestive Heart Failure:  No    Current as of 2 hours ago  0 Has Vascular Disease:  No    Current as of 2 hours ago  0 Has Hypertension:  No    Current as of 2 hours ago  1 Age:  61    Current as of 2 hours ago  1 Has Diabetes:  Yes    Current as of 2 hours ago  0 Had Stroke:  No  Had TIA:  No  Had thromboembolism:  No    Current as of 2 hours ago  0 Male:  No    Current as of 2 hours ago    CXR 9/21: NAD  TEE: FINDINGS:  LEFT VENTRICLE: EF = 50-55%  RIGHT VENTRICLE: Normal size and function.   LEFT ATRIUM: No thrombus/mass.  LEFT ATRIAL APPENDAGE: No thrombus/mass.   RIGHT ATRIUM: No thrombus/mass.  AORTIC VALVE:  Trileaflet. No regurgitation.   MITRAL VALVE:    Normal structure. Mild regurgitation.   TRICUSPID VALVE: Normal structure. No regurgitation.   PULMONIC VALVE: Grossly normal structure. No regurgitation. No apparent vegetation.  INTERATRIAL SEPTUM: No PFO or ASD seen by color Doppler.  PERICARDIUM: No effusion noted.  DESCENDING AORTA: Mild plaque    CARDIOVERSION:     Indications:  Symptomatic Atrial Flutter  Procedure Details:  Once the TEE was complete, the patient had the defibrillator pads placed in the anterior and posterior position. Once an appropriate level of sedation was confirmed, the patient was cardioverted x 1 with 100J of biphasic synchronized energy.  The patient converted to NSR.  There were no apparent complications.  The patient had normal neuro status and respiratory  status post procedure with vitals stable as recorded elsewhere.  Adequate airway was maintained throughout and vital signs monitored per protocol.   Lab Results  Component Value Date   HGBA1C 5.9 (A) 10/01/2020   BMP Latest Ref Rng & Units 10/30/2019 09/11/2019 08/23/2019  Glucose 70 - 99 mg/dL 132(H) 144(H) 143(H)  BUN 8 - 23 mg/dL _0 Creatinine 0.61 - 1.24 mg/dL 0.83 0.88 0.86  BUN/Creat Ratio 10 - 24 - 16 -  Sodium 135 - 145 mmol/L 141 141 137  Potassium 3.5 - 5.1 mmol/L 4.1 4.2 3.6  Chloride 98 - 111 mmol/L 105 106 106  CO2 22 - 32 mmol/L _1 Calcium 8.9 - 10.3 mg/dL 9.1 9.2 8.4(L)   Hepatic Function Latest Ref Rng & Units 09/11/2019 08/21/2019  Total Protein 6.0 - 8.5 g/dL 6.1 5.2(L)  Albumin 3.8 - 4.8 g/dL 4.4 3.0(L)  AST 0 - 40 IU/L 17 15  ALT 0 - 44 IU/L 26 21  Alk Phosphatase 39 - 117 IU/L 65 66  Total Bilirubin 0.0 - 1.2 mg/dL 0.4 1.3(H)   CBC Latest Ref Rng & Units 10/30/2019 08/23/2019 08/22/2019  WBC 4.0 - 10.5 K/uL 5.8 8.3 7.3  Hemoglobin 13.0 - 17.0 g/dL  14.3 13.4 13.3  Hematocrit 39 - 52 % 43.9 37.7(L) 39.1  Platelets 150 - 400 K/uL 213 115(L) 131(L)   Lipid Panel     Component Value Date/Time   CHOL 168 09/11/2019 1145   TRIG 153 (H) 09/11/2019 1145   HDL 41 09/11/2019 1145   CHOLHDL 4.1 09/11/2019 1145   LDLCALC 100 (H) 09/11/2019 1145   LABVLDL 27 09/11/2019 1145   Lab Results  Component Value Date   HGBA1C 5.9 (A) 10/01/2020        Assessment & Plan:  I personally reviewed all images and lab data in the [facility] system as well as any outside material available during this office visit and agree with the  radiology impressions.   PAF (paroxysmal atrial fibrillation) (HCC) Paroxysmal atrial fibrillation stable at this time continue low-dose metoprolol and Eliquis due to high CHA2DS2-VASc 2 score follow-up with cardiology  Periodontal disease Patient is getting dental care but has elected not to have teeth pulled at this  time  Hyperlipidemia associated with type 2 diabetes mellitus ([place]) Continue low-dose statin and check lipid levels today  Type 2 diabetes mellitus, controlled ([place]) Type 2 diabetes good control A1c now 5.9 we will check creatinine for nephropathy screening and also microalbumin  Onychomycosis Will refer back to podiatry to reevaluate toenails   Jerl was seen today for diabetes and hyperlipidemia.  Diagnoses and all orders for this visit:  Controlled type 2 diabetes mellitus with hyperglycemia, with long-term current use of insulin (HCC) -     Glucose (CBG) -     HgB A1c -     Microalbumin/Creatinine Ratio, Urine -     HM Diabetes Foot Exam -     Lipid Panel -     Comprehensive metabolic panel -     Ambulatory referral to Podiatry  Hyperlipidemia associated with type 2 diabetes mellitus ([place]) -     Lipid Panel  Screening for deficiency anemia -     CBC with Differential  Onychomycosis -     Ambulatory referral to Podiatry  PAF (paroxysmal atrial fibrillation) (HCC)  Periodontal disease  Other orders -     BD PEN NEEDLE NANO 2ND GEN 32G X 4 MM MISC; Use with Tyler Aas Flextouch -     metFORMIN (GLUCOPHAGE) 1000 MG tablet; Take 1 tablet (1,000 mg total) by mouth 2 (two) times daily with a meal. -     atorvastatin (LIPITOR) 10 MG tablet; Take 1 tablet (10 mg total) by mouth daily. -     insulin degludec (TRESIBA FLEXTOUCH) 100 UNIT/ML FlexTouch Pen; Inject 10 Units into the skin daily. -     metoprolol tartrate (LOPRESSOR) 25 MG tablet; Take 0.5 tablets (12.5 mg total) by mouth daily. -     apixaban (ELIQUIS) 5 MG TABS tablet; Take 1 tablet (5 mg total) by mouth 2 (two) times daily.

## 2020-10-01 NOTE — Assessment & Plan Note (Signed)
Patient is getting dental care but has elected not to have teeth pulled at this time

## 2020-10-02 LAB — COMPREHENSIVE METABOLIC PANEL
ALT: 13 IU/L (ref 0–44)
AST: 9 IU/L (ref 0–40)
Albumin/Globulin Ratio: 2.2 (ref 1.2–2.2)
Albumin: 4.4 g/dL (ref 3.8–4.8)
Alkaline Phosphatase: 77 IU/L (ref 44–121)
BUN/Creatinine Ratio: 21 (ref 10–24)
BUN: 15 mg/dL (ref 8–27)
Bilirubin Total: 0.8 mg/dL (ref 0.0–1.2)
CO2: 24 mmol/L (ref 20–29)
Calcium: 9.6 mg/dL (ref 8.6–10.2)
Chloride: 102 mmol/L (ref 96–106)
Creatinine, Ser: 0.7 mg/dL — ABNORMAL LOW (ref 0.76–1.27)
GFR calc Af Amer: 112 mL/min/{1.73_m2} (ref >59)
GFR calc non Af Amer: 97 mL/min/{1.73_m2} (ref >59)
Globulin, Total: 2 g/dL (ref 1.5–4.5)
Glucose: 112 mg/dL — ABNORMAL HIGH (ref 65–99)
Potassium: 4.9 mmol/L (ref 3.5–5.2)
Sodium: 140 mmol/L (ref 134–144)
Total Protein: 6.4 g/dL (ref 6.0–8.5)

## 2020-10-02 LAB — CBC WITH DIFFERENTIAL/PLATELET
Basophils Absolute: 0 10*3/uL (ref 0.0–0.2)
Basos: 1 %
EOS (ABSOLUTE): 0.1 10*3/uL (ref 0.0–0.4)
Eos: 1 %
Hematocrit: 43.5 % (ref 37.5–51.0)
Hemoglobin: 14.5 g/dL (ref 13.0–17.7)
Immature Grans (Abs): 0 10*3/uL (ref 0.0–0.1)
Immature Granulocytes: 0 %
Lymphocytes Absolute: 2 10*3/uL (ref 0.7–3.1)
Lymphs: 29 %
MCH: 30.9 pg (ref 26.6–33.0)
MCHC: 33.3 g/dL (ref 31.5–35.7)
MCV: 93 fL (ref 79–97)
Monocytes Absolute: 0.5 10*3/uL (ref 0.1–0.9)
Monocytes: 8 %
Neutrophils Absolute: 4.3 10*3/uL (ref 1.4–7.0)
Neutrophils: 61 %
Platelets: 229 10*3/uL (ref 150–450)
RBC: 4.7 x10E6/uL (ref 4.14–5.80)
RDW: 12 % (ref 11.6–15.4)
WBC: 7 10*3/uL (ref 3.4–10.8)

## 2020-10-02 LAB — LIPID PANEL
Chol/HDL Ratio: 2.8 ratio (ref 0.0–5.0)
Cholesterol, Total: 102 mg/dL (ref 100–199)
HDL: 36 mg/dL — ABNORMAL LOW (ref >39)
LDL Chol Calc (NIH): 46 mg/dL (ref 0–99)
Triglycerides: 105 mg/dL (ref 0–149)
VLDL Cholesterol Cal: 20 mg/dL (ref 5–40)

## 2020-10-02 NOTE — Progress Notes (Signed)
ATC pt, no answer, LM informing of normal results, also sent NR letter via [place].

## 2020-10-20 ENCOUNTER — Other Ambulatory Visit: Payer: Self-pay | Admitting: Critical Care Medicine

## 2020-10-20 DIAGNOSIS — I480 Paroxysmal atrial fibrillation: Secondary | ICD-10-CM

## 2020-12-11 ENCOUNTER — Ambulatory Visit: Payer: 59 | Admitting: Internal Medicine

## 2021-01-27 ENCOUNTER — Other Ambulatory Visit: Payer: Self-pay

## 2021-01-27 ENCOUNTER — Ambulatory Visit: Payer: 59 | Attending: Critical Care Medicine | Admitting: Critical Care Medicine

## 2021-01-27 ENCOUNTER — Encounter: Payer: Self-pay | Admitting: Critical Care Medicine

## 2021-01-27 VITALS — BP 131/79 | HR 62 | Temp 98.6°F | Resp 18 | Ht 75.0 in | Wt 187.2 lb

## 2021-01-27 DIAGNOSIS — Z794 Long term (current) use of insulin: Secondary | ICD-10-CM | POA: Diagnosis not present

## 2021-01-27 DIAGNOSIS — I480 Paroxysmal atrial fibrillation: Secondary | ICD-10-CM

## 2021-01-27 DIAGNOSIS — M25512 Pain in left shoulder: Secondary | ICD-10-CM | POA: Diagnosis not present

## 2021-01-27 DIAGNOSIS — G8929 Other chronic pain: Secondary | ICD-10-CM

## 2021-01-27 DIAGNOSIS — E1165 Type 2 diabetes mellitus with hyperglycemia: Secondary | ICD-10-CM | POA: Diagnosis not present

## 2021-01-27 DIAGNOSIS — E1169 Type 2 diabetes mellitus with other specified complication: Secondary | ICD-10-CM

## 2021-01-27 DIAGNOSIS — E785 Hyperlipidemia, unspecified: Secondary | ICD-10-CM

## 2021-01-27 LAB — POCT CBG (FASTING - GLUCOSE)-MANUAL ENTRY: Glucose Fasting, POC: 108 mg/dL — AB (ref 70–99)

## 2021-01-27 LAB — POCT GLYCOSYLATED HEMOGLOBIN (HGB A1C): Hemoglobin A1C: 5.8 % — AB (ref 4.0–5.6)

## 2021-01-27 MED ORDER — APIXABAN 5 MG PO TABS
5.0000 mg | ORAL_TABLET | Freq: Two times a day (BID) | ORAL | 1 refills | Status: DC
Start: 2021-01-27 — End: 2021-08-25

## 2021-01-27 MED ORDER — ATORVASTATIN CALCIUM 10 MG PO TABS: 10.0000 mg | ORAL_TABLET | Freq: Every day | ORAL | 1 refills | Status: DC

## 2021-01-27 MED ORDER — METFORMIN HCL 1000 MG PO TABS: 1000.0000 mg | ORAL_TABLET | Freq: Two times a day (BID) | ORAL | 1 refills | Status: DC

## 2021-01-27 MED ORDER — TRESIBA FLEXTOUCH 100 UNIT/ML ~~LOC~~ SOPN: 10.0000 [IU] | PEN_INJECTOR | Freq: Every day | SUBCUTANEOUS | 3 refills | Status: DC

## 2021-01-27 MED ORDER — BD PEN NEEDLE NANO 2ND GEN 32G X 4 MM MISC: 11 refills | Status: DC

## 2021-01-27 MED ORDER — METOPROLOL TARTRATE 25 MG PO TABS: 12.5000 mg | ORAL_TABLET | Freq: Every day | ORAL | 1 refills | Status: DC

## 2021-01-27 NOTE — Assessment & Plan Note (Signed)
Continue atorvastatin

## 2021-01-27 NOTE — Assessment & Plan Note (Addendum)
A1c 5.8 type 2 diabetes controlled at this time  No change in [place] or Toprol dose Check urine for microalbumin

## 2021-01-27 NOTE — Progress Notes (Signed)
Subjective:    Patient ID: Cameron Arroyo, male    DOB: 01-May-1951, 70 y.o.   MRN: [id number]  70 y.o.M post hosp f/u and to establish PCP  09/11/19 When I entered the room this patient was extremely angry and I had to spend at least 10 minutes of the visit initially de-escalating the patient's anger.  The patient was accompanied by his spouse who also was very frustrated.  Their statement is that when the patient was admitted from the 21st and 24 September due to the COVID restrictions the spouse could not visit and the patient felt that he was getting conflicting information between various providers who saw him during his short visit.  Also the patient was to receive a post hospital visit with cardiology but that appointment did not appear to be made as of yet. The patient had a former primary care provider at the [facility] site however is wishing to establish care.  Has not been seen by primary care provider in 6 years.  During the hospitalization the patient was found to have new onset diabetes with hemoglobin A1c of 11.5.  He was to go home on Lantus but his insurance would not cover this so he switched to NPH 15 units in the morning and 5 units in the evening.  He is also on 500 mg once daily of metformin.  The patient brings a very complete record of all of his weights blood pressures heart rate and blood sugars.  His blood sugars appear to range anywhere from a low of 1 10-1 20 to a high of 1 70-1 80.  His morning sugars tend to run lower his afternoon sugars tend to run higher.  The patient's weight has gone down he did suffer significant weight loss in the past.  He is 189 pounds today and note his CBG today is 172  The patient did have polyuria and polydipsia this is improved somewhat but is still occurring.  The patient was admitted he had atrial fibrillation atrial flutter with rapid ventricular response and was hypotensive secondary to hypovolemia with associated  hypermagnesemia  As the patient's electrolytes and fluids were replaced and replenished the patient improved.  Once again he was sent home on the NPH and metformin as noted above.  The patient was also cardioverted during this hospitalization and placed on low-dose metoprolol along with Eliquis twice daily  Below is a copy of the discharge Arroyo  Patient: Cameron Arroyo                                                         Admit date: 08/20/2019   DOB: 19-Apr-1951                                                              Discharge date:08/23/2019/11:20 AM ZYS:[id number]  Discharge Diagnoses:   Principal Problem:   New onset type 2 diabetes mellitus ([place]) Active Problems:   Atrial flutter with rapid ventricular response (HCC)   Leukocytosis   Unintentional weight loss   Polyuria   Hypotension due to hypovolemia   Diabetes mellitus due to underlying condition, uncontrolled, with hyperglycemia ([place])   History of Present Illness/ Hospital Course Cameron Arroyo: Cameron Jenkins Jonesis a 70 y.o.malewithno significant past medical history came to the emergency department with multiple problems. He reports that he feels like crap, polyuria, dry mouth, decreased appetite, weight loss, shortness of breath, anxiety, no energy since 2 to 3 weeks. He went to [facility] health urgent care initially where he was found to have atrial flutter with rapid response and was advised to go to the emergency department for further evaluation and management. He reports shortness of breath but denies association with chest pain, palpitation, headache, blurry vision, lightheadedness, dizziness, nausea, vomiting, epigastric pain, diarrhea, constipation, fever, chills, decreased appetite, night sweats, family history of thyroid issues, colon  cancer, hematemesis, melena, over-the-counter use of NSAIDs. Reports unintentional weight loss of 30 pounds in the last 1 year. He is a former smoker however denies alcohol, illicit drug use. He was prescribed steroids by his PCP recently for left knee pain. Patient reports that his left knee pain is better.   ED Course:Patient was found to have rapid narrow complex tachycardia suggestive of atrial flutter.Hyperglycemia of blood glucose 430, and leukocytosis of 12.7. Initial troponin x1-. Magnesium level elevated at 2.5. Chest x-ray came back negative. COVID-19: Pending. CBC: H&H is stable. Kidney function: WNL.[name] Kitchen Started on IV diltiazem and IV heparin.Transthoracic echo was ordered which was not performed due to patient's tachycardia.  Briefly patient was admitted with atrial flutter, status post TEE cardioversion which was successful on 08/22/2019 currently in normal sinus rhythm.  Per cardiology patient started on metoprolol and Eliquis.  Patient was noted from new onset diabetes mellitus with a hemoglobin A1c of 11.5.  Patient started on Lantus and metformin.  He was educated on diabetes, diet, instructed to check his blood sugars 3 times daily-4 times daily, keep a log for adjustment and titration of the medication for better glycemic control.  Patient was deemed stable was discharged home.   Discharge Arroyo/Assessment & Plan:   Hyperglycemia/ newly diagnosed diabetesmellitus, POA: -Blood sugar more than 400 upon arrival -A1C 11.5 -Patient presented with polyuria, dry mouth, weight loss, fatigue -will start Lantus 20u / day and SSI/started on metformin 500 mg -Patient was given glucometer, instructed to check blood sugars 3 times daily-4 times daily, keep close log... Follow with PCP with adjustment of medication accordingly.  -Status post volume repletion -consult diabetes team for education; dietary following  Atrial flutter, asymptomatic,  POA: -Patientasymptomatic regarding arrhythmia -Patient heart rates more than 150s - trop neg and CXR negative - Thyroid function studyWNL - seen by cardiology, on IV heparin and IV diltiazem now- pendingTEE/cardioversionOn  08/22/19 -Cardioversion was successful, patient is currently in normal sinus rhythm -Per cardiology patient was started on metoprolol and Eliquis prescription were provided  Leukocytosis,reactive, resolved: -Likely reactive given recent steroid use, A. fib/flutter and hyperglycemia -Resolved no signs of infection.  Unintentional weight loss: -Reported loss of30 pounds in 1 year. -Likely in the setting of poorly controlled diabetes with A1c of 11.5 -Patient was instructed to keep an close log of his weight changes.   Code Status:Partial, DO NOT INTUBATE Family Communication:None present Disposition Plan: Patient will be discharged home, with close follow with  PCP and cardiologist in 1 to 2 weeks.  Consultants:  Cardiology  Procedures:  Status post tentative cardioversion on 08/22/2019 currently on normal sinus rhythm    Note the patient has also recently been seen by sports medicine physician in the [facility] here in [place] who is referring him to Dr. Sherwood Gambler of neurosurgery along with an MRI because of severe low back pain.  Note the note the neurosurgeon had done cervical spine surgery in the past.  Below is the office note and plan from that visit Low back pain lumbar issue saw sports medicine 09/11/19: Assessment Note I had a long discussion with the patient. He is quite miserable today. I do believe this is all emanating from his lumbar spine. He is now greater than 6 weeks into conservative care and not improving at all. The prednisone did not help and he reports may have made him worse. I will place him on to gabapentin and titrate up slowly as tolerated. He will monitor for sedation. We will obtain an MRI scan of his  lumbar spine. He would like to be referred to Dr. Sherwood Gambler for further treatment. I will go and see if I can make the arrangements for him. He is quite frustrated with his pain and would like to see all of his options. All questions were encouraged and answered.   1. Low back pain . XR, lumbar spine  . MRI, lumbar spine, w/o contrast - mri lumbar spine r/o hnp  . magnetic resonance imaging (MRI): about this test . G-MRI Self Disclosure Notice . neurosurgery referral  2. Pain in left knee 3. Lumbar spondylosis with myelopathy . gabapentin 300 mg capsule    The patient also shared with me his diet plan and all of his meals are carefully written down by the spouse.  Note that he is eating a lot of potatoes and grits with his meals.  Note also the patient's PHQ 9 is extremely high during this visit and it is clear that he is experiencing some degree of frustration with healthcare system and has been experiencing quite a bit of anxiety and depression.   10/02/2019 This patient returns in follow-up for diabetes, atrial flutter, hyperlipidemia, lumbar iSpondylosis with myelopathy. At the last visit we obtained a lipid panel showing an LDL of 100 and elected to begin atorvastatin 10 mg daily.  The patient is yet to pick this up because when he was on a lipid medication 7 years ago he paid $3 a pill.  I explained to the patient this is now generic medicine and should not of been that expensive.  He wants to discuss the need for this with his cardiologist and his appointment which she has tomorrow morning.  The patient states he has been doing better with his blood glucoses and displays a very nice log.  His blood sugars run typically fasting 100-120 and postprandial and late in the afternoon will run as high as 170 but more recently with increases in his tresbia have been running closer to the 120-130 range The patient did decline at this visit a flu vaccine and pneumonia vaccine  Patient did go to  podiatry and had his toenails debrided and had a normal foot exam  The patient is seeing physical therapy for his chronic low back pain and as well has upcoming appointments with neurosurgery  The patient's question is can he come off the Eliquis should he require a surgical procedure on his lumbar spine I asked him  to discuss that with his cardiologist     12/15: Since the last visit the patient states his blood sugars have been in improved range and he brings a diary with him showing glucoses in the 100-120 range morning and evening.  There are no postprandial glucoses and today his glucose was 211 however this was after eating a bowl of grits before arriving to the clinic.  Blood pressures have been in great control and align with a blood pressure reading today 118/72.  Pulse rates been as low as 47 but in general was in the 50s to mid 60s on the atenolol.  Note hemoglobin A1c was 6.7 is a preop value before lumbar surgery.  He successfully underwent lumbar surgery per neurosurgery and is recovering from this.  He is not yet in physical therapy but has an upcoming post hospital appointment with the neurosurgeon.  He had been no episodes of palpitations at this time noted.  He states his fatigue is improved and his urination is improved as well.  He continues to be concerned about the cost of his a atorvastatin.  He is concerned when his deductible runs out his cost will be increased.  The patient does say his fatigue is better.  His lower back pain is improved from the surgery.  He has minimal edema in the ankles.  He is compliant with his insulin and Metformin program.  He has been monitoring his sugars.   01/22/2020 Since the last office visit the patient has shown continued improvement in blood pressure and diabetes control.  He brings his diary with him and most of his glucoses have been fasting in the 100 range and postprandial no higher than 140.  Occasionally with cheat meals he has been as  high as 240 postprandial  Blood pressures also been good at home in the 110-120/70 range pulse is in the 60s and 70s.  He denies any palpitations or increased shortness of breath.  He does have a history of paroxysmal atrial fibrillation and has a chads vascular 2 score and he has since met with cardiology and they recommend that he continue the Eliquis for now that he stops that he is high risk for stroke unless he undergoes a catheter ablation.  The patient tells me today he does not wish to undergo a catheter ablation.  Patient is maintaining atorvastatin 10 mg daily and also Tresiba at 15 units daily of Metformin at 1000 mg twice daily.  He has lumbar disease is improved status post surgery.  Patient does complain of bilateral hearing loss and as well complains of severe dentition issues needing further dental care  05/20/2020 Patient is seen in return follow-up for paroxysmal atrial fibrillation type 2 diabetes dental disease hyperlipidemia.  Also has hypertension.  Note the patient is much improved.  He has had good blood pressure control on his diary.  Note his blood glucoses have been in the 100-1 ten range and some of the even been in the ninety range.  He is on the [place] 15 units daily along with Metformin at 1000 mg twice daily and also maintains metoprolol at 12-1/2 mg daily maintains Lipitor at 10 mg daily as well.  The patient has been reducing his Tyler Aas dosing that he skipped some doses when his evening blood glucose is in the seventy range.  His hemoglobin A1c today is 5.9.  Patient has no other new complaints.  He did have a colonoscopy which showed polyps to have a recall in 3  years none of the polyps were cancerous.  He does maintain Eliquis twice daily. The patient did receive his eye exam as required. The patient also is due a colonoscopy his last one was over 10 years ago and he did have polyps at that time therefore will make a referral to gastroenterology for the polyp  exam  Patient also knows an ophthalmology exam is needed this year for his retinal evaluations  The patient is yet to receive his Covid vaccine.  10/01/2020 This patient returns in follow-up and is doing quite well with adherence to his atrial fibrillation medications and diabetic care.  On arrival blood pressure is excellent at 123/72 BMI is 23 and is blood sugar is 105 with an A1c of 5.9  The patient's had no recent new complaints.  He had questions about the flow of the insulin through his needle which I addressed also questions regarding blood pressure and blood glucose associations which I also addressed  Patient complains of some thickening toenails in the left great toenail of the foot and he is due today for a foot exam  He has severe periodontal disease and has yet to achieve dental care he is continuing daily cleansing of the teeth  The patient adamantly refuses Covid vaccine and flu vaccines  01/27/2021 Patient comes in today in good spirits and reports improvement in glycemic control and blood pressure control.  He brings with him his diary and has excellent blood pressures in the 110/70-80 range blood glucose is in the 110 no higher than 140 range.  On arrival blood glucose is 108 and A1c is 5.8.  He continues [place] and Metformin.  Patient does complain of new left shoulder pain which is semichronic in nature.  He is requesting orthopedic evaluation of this.  He has difficulty raising his left shoulder above a certain angle.  Patient is accompanied by his wife who is wondering if he can come off many of his medications and is worried about his dietary pattern I reiterated to her that this is good news about his glycemic control and would not wish to change medicines unless he became hypoglycemic which she has not Patient has upcoming visit with cardiology  Patient continues to adamantly refuse any Covid vaccine  Patient is due a urine microalbumin  Past Medical History:   Diagnosis Date  . Arthritis   . Asthma    as child  . Atrial flutter with rapid ventricular response ([place]) 08/20/2019  . Lumbar disc herniation 11/01/2019  . New onset type 2 diabetes mellitus ([place]) 08/20/2019  . Pneumonia    history of     Family History  Problem Relation Age of Onset  . Cirrhosis Mother   . Cancer Father      Social History   Socioeconomic History  . Marital status: Married    Spouse name: Not on file  . Number of children: Not on file  . Years of education: Not on file  . Highest education level: Not on file  Occupational History  . Not on file  Tobacco Use  . Smoking status: Former Smoker    Quit date: 11/29/1992    Years since quitting: 28.1  . Smokeless tobacco: Never Used  Vaping Use  . Vaping Use: Never used  Substance and Sexual Activity  . Alcohol use: No  . Drug use: Never  . Sexual activity: Not Currently  Other Topics Concern  . Not on file  Social History Narrative  . Not on file  Social Determinants of Health   Financial Resource Strain: Not on file  Food Insecurity: Not on file  Transportation Needs: Not on file  Physical Activity: Not on file  Stress: Not on file  Social Connections: Not on file  Intimate Partner Violence: Not on file     No Known Allergies   Outpatient Medications Prior to Visit  Medication Sig Dispense Refill  . apixaban (ELIQUIS) 5 MG TABS tablet Take 1 tablet (5 mg total) by mouth 2 (two) times daily. 180 tablet 1  . atorvastatin (LIPITOR) 10 MG tablet Take 1 tablet (10 mg total) by mouth daily. 90 tablet 1  . BD PEN NEEDLE NANO 2ND GEN 32G X 4 MM MISC Use with Tresiba Flextouch 100 each 11  . insulin degludec (TRESIBA FLEXTOUCH) 100 UNIT/ML FlexTouch Pen Inject 10 Units into the skin daily. 15 mL 3  . metFORMIN (GLUCOPHAGE) 1000 MG tablet Take 1 tablet (1,000 mg total) by mouth 2 (two) times daily with a meal. 180 tablet 1  . metoprolol tartrate (LOPRESSOR) 25 MG tablet Take 0.5 tablets (12.5 mg  total) by mouth daily. 60 tablet 1  . gabapentin (NEURONTIN) 300 MG capsule Take by mouth.     No facility-administered medications prior to visit.      Review of Systems  Constitutional: Negative.   HENT: Negative.   Eyes: Negative for visual disturbance.  Cardiovascular: Negative.   Gastrointestinal: Negative.   Genitourinary: Negative.   Musculoskeletal: Positive for back pain.       Left shoulder pain  Skin: Negative.   Neurological: Negative.   Psychiatric/Behavioral: Negative.          Objective:   Physical Exam Vitals:   01/27/21 0838  BP: 131/79  Pulse: 62  Resp: 18  Temp: 98.6 F (37 C)  TempSrc: Oral  SpO2: 96%  Weight: 187 lb 3.2 oz (84.9 kg)  Height: 6' 3" (1.905 m)    Wt Readings from Last 3 Encounters:  01/27/21 187 lb 3.2 oz (84.9 kg)  10/01/20 186 lb (84.4 kg)  05/20/20 186 lb (84.4 kg)    Gen: The patient is in much better spirits today and is in no acute distress weight has stabilized  ENT: No lesions,  mouth clear,  oropharynx clear, no postnasal drip Severe periodontal disease and carious teeth but somewhat improved from the last visit  Neck: No JVD, no TMG, no carotid bruits  Lungs: No use of accessory muscles, no dullness to percussion, clear without rales or rhonchi  Cardiovascular: RRR, heart sounds normal, no murmur or gallops, no peripheral edema  Abdomen: soft and NT, no HSM,  BS normal  Musculoskeletal: Tender in the humeral joint area with decreased range of motion of the left shoulder  Neuro: alert, non focal  Skin: Warm, no lesions or rashes    CHA2DS2/VAS Stroke Risk Points  Current as of 2 hours ago     2 >= 2 Points: High Risk  1 - 1.99 Points: Medium Risk  0 Points: Low Risk    The patient's score has not changed in the past year.: No Change     Details    This score determines the patient's risk of having a stroke if the  patient has atrial fibrillation.       Points Metrics  0 Has Congestive Heart  Failure:  No    Current as of 2 hours ago  0 Has Vascular Disease:  No    Current as of 2 hours ago  0 Has Hypertension:  No    Current as of 2 hours ago  1 Age:  6    Current as of 2 hours ago  1 Has Diabetes:  Yes    Current as of 2 hours ago  0 Had Stroke:  No  Had TIA:  No  Had thromboembolism:  No    Current as of 2 hours ago  0 Male:  No    Current as of 2 hours ago    CXR 9/21: NAD  TEE: FINDINGS:  LEFT VENTRICLE: EF = 50-55%  RIGHT VENTRICLE: Normal size and function.   LEFT ATRIUM: No thrombus/mass.  LEFT ATRIAL APPENDAGE: No thrombus/mass.   RIGHT ATRIUM: No thrombus/mass.  AORTIC VALVE:  Trileaflet. No regurgitation.   MITRAL VALVE:    Normal structure. Mild regurgitation.   TRICUSPID VALVE: Normal structure. No regurgitation.   PULMONIC VALVE: Grossly normal structure. No regurgitation. No apparent vegetation.  INTERATRIAL SEPTUM: No PFO or ASD seen by color Doppler.  PERICARDIUM: No effusion noted.  DESCENDING AORTA: Mild plaque    CARDIOVERSION:     Indications:  Symptomatic Atrial Flutter  Procedure Details:  Once the TEE was complete, the patient had the defibrillator pads placed in the anterior and posterior position. Once an appropriate level of sedation was confirmed, the patient was cardioverted x 1 with 100J of biphasic synchronized energy.  The patient converted to NSR.  There were no apparent complications.  The patient had normal neuro status and respiratory status post procedure with vitals stable as recorded elsewhere.  Adequate airway was maintained throughout and vital signs monitored per protocol.   Lab Results  Component Value Date   HGBA1C 5.8 (A) 01/27/2021   BMP Latest Ref Rng & Units 10/01/2020 10/30/2019 09/11/2019  Glucose 65 - 99 mg/dL 112(H) 132(H) 144(H)  BUN 8 - 27 mg/dL _0 Creatinine 0.76 - 1.27 mg/dL 0.70(L) 0.83 0.88  BUN/Creat Ratio 10 - 24 21 - 16  Sodium 134 - 144 mmol/L 140 141 141   Potassium 3.5 - 5.2 mmol/L 4.9 4.1 4.2  Chloride 96 - 106 mmol/L 102 105 106  CO2 20 - 29 mmol/L _1 Calcium 8.6 - 10.2 mg/dL 9.6 9.1 9.2   Hepatic Function Latest Ref Rng & Units 10/01/2020 09/11/2019 08/21/2019  Total Protein 6.0 - 8.5 g/dL 6.4 6.1 5.2(L)  Albumin 3.8 - 4.8 g/dL 4.4 4.4 3.0(L)  AST 0 - 40 IU/L _2 ALT 0 - 44 IU/L _3 Alk Phosphatase 44 - 121 IU/L 77 65 66  Total Bilirubin 0.0 - 1.2 mg/dL 0.8 0.4 1.3(H)   CBC Latest Ref Rng & Units 10/01/2020 10/30/2019 08/23/2019  WBC 3.4 - 10.8 x10E3/uL 7.0 5.8 8.3  Hemoglobin 13.0 - 17.7 g/dL 14.5 14.3 13.4  Hematocrit 37.5 - 51.0 % 43.5 43.9 37.7(L)  Platelets 150 - 450 x10E3/uL 229 213 115(L)   Lipid Panel     Component Value Date/Time   CHOL 102 10/01/2020 0927   TRIG 105 10/01/2020 0927   HDL 36 (L) 10/01/2020 0927   CHOLHDL 2.8 10/01/2020 0927   LDLCALC 46 10/01/2020 0927   LABVLDL 20 10/01/2020 2703   Lab Results  Component Value Date   HGBA1C 5.8 (A) 01/27/2021        Assessment & Plan:  I personally reviewed all images and lab data in the [facility] system as well as any outside material available during this office visit and agree with  the  radiology impressions.   PAF (paroxysmal atrial fibrillation) (HCC) History of paroxysmal atrial fibrillation currently in sinus rhythm  Continue low-dose metoprolol and apixaban  Follow-up with cardiology planned this month  Hyperlipidemia associated with type 2 diabetes mellitus ([place]) Continue atorvastatin  Type 2 diabetes mellitus, controlled (HCC) A1c 5.8 type 2 diabetes controlled at this time  No change in [place] or Toprol dose Check urine for microalbumin  Chronic left shoulder pain Chronic left shoulder pain likely in the humeral joint area  Referral to orthopedics made   Eros was seen today for follow-up.  Diagnoses and all orders for this visit:  Controlled type 2 diabetes mellitus with hyperglycemia, with long-term current use of  insulin (HCC) -     BD PEN NEEDLE NANO 2ND GEN 32G X 4 MM MISC; Use with Tyler Aas Flextouch -     insulin degludec (TRESIBA FLEXTOUCH) 100 UNIT/ML FlexTouch Pen; Inject 10 Units into the skin daily. -     metFORMIN (GLUCOPHAGE) 1000 MG tablet; Take 1 tablet (1,000 mg total) by mouth 2 (two) times daily with a meal. -     Glucose (CBG), Fasting -     HgB A1c  Hyperlipidemia associated with type 2 diabetes mellitus (HCC) -     Microalbumin / creatinine urine ratio -     atorvastatin (LIPITOR) 10 MG tablet; Take 1 tablet (10 mg total) by mouth daily.  PAF (paroxysmal atrial fibrillation) (HCC) -     Microalbumin / creatinine urine ratio -     metoprolol tartrate (LOPRESSOR) 25 MG tablet; Take 0.5 tablets (12.5 mg total) by mouth daily.  Hyperlipidemia associated with type 2 diabetes mellitus (HCC) -     Microalbumin / creatinine urine ratio -     atorvastatin (LIPITOR) 10 MG tablet; Take 1 tablet (10 mg total) by mouth daily.  PAF (paroxysmal atrial fibrillation) (HCC) -     Microalbumin / creatinine urine ratio -     metoprolol tartrate (LOPRESSOR) 25 MG tablet; Take 0.5 tablets (12.5 mg total) by mouth daily.  Chronic left shoulder pain -     Ambulatory referral to Orthopedic Surgery  Other orders -     apixaban (ELIQUIS) 5 MG TABS tablet; Take 1 tablet (5 mg total) by mouth 2 (two) times daily.

## 2021-01-27 NOTE — Assessment & Plan Note (Signed)
Chronic left shoulder pain likely in the humeral joint area  Referral to orthopedics made

## 2021-01-27 NOTE — Assessment & Plan Note (Signed)
History of paroxysmal atrial fibrillation currently in sinus rhythm  Continue low-dose metoprolol and apixaban  Follow-up with cardiology planned this month

## 2021-01-27 NOTE — Patient Instructions (Signed)
Urine for microalbumin is obtained  No change in your medications refill sent to your pharmacy  Referral to orthopedics made for your left shoulder  Keep your cardiology appointment upcoming  Return to Dr. Joya Gaskins 4 months

## 2021-01-28 LAB — MICROALBUMIN / CREATININE URINE RATIO
Creatinine, Urine: 49.3 mg/dL
Microalb/Creat Ratio: <6 mg/g creat (ref 0–29)
Microalbumin, Urine: <3 ug/mL

## 2021-01-28 NOTE — Progress Notes (Signed)
Let the pt know urine microalbumin is normal

## 2021-01-29 ENCOUNTER — Telehealth: Payer: Self-pay | Admitting: Critical Care Medicine

## 2021-01-29 NOTE — Telephone Encounter (Signed)
Copied from [place] [id number]. Topic: General - Call Back - No Documentation >> Jan 28, 2021  4:21 PM Erick Blinks wrote: Reason for CRM: Pt returned call for lab results, please advise Best contact: [phone]

## 2021-02-02 ENCOUNTER — Encounter: Payer: Self-pay | Admitting: *Deleted

## 2021-02-03 ENCOUNTER — Encounter: Payer: Self-pay | Admitting: *Deleted

## 2021-02-12 ENCOUNTER — Ambulatory Visit (INDEPENDENT_AMBULATORY_CARE_PROVIDER_SITE_OTHER): Payer: 59 | Admitting: Internal Medicine

## 2021-02-12 ENCOUNTER — Encounter: Payer: Self-pay | Admitting: Internal Medicine

## 2021-02-12 ENCOUNTER — Other Ambulatory Visit: Payer: Self-pay

## 2021-02-12 VITALS — BP 117/64 | HR 54 | Ht 75.0 in | Wt 187.0 lb

## 2021-02-12 DIAGNOSIS — E782 Mixed hyperlipidemia: Secondary | ICD-10-CM | POA: Diagnosis not present

## 2021-02-12 DIAGNOSIS — I4892 Unspecified atrial flutter: Secondary | ICD-10-CM

## 2021-02-12 DIAGNOSIS — Z7901 Long term (current) use of anticoagulants: Secondary | ICD-10-CM | POA: Diagnosis not present

## 2021-02-12 NOTE — Progress Notes (Signed)
OFFICE NOTE  Chief Complaint:  Follow-up atrial flutter  Primary Care Physician: Elsie Stain, MD  HPI:  Cameron Arroyo is a 70 y.o. male with a past medial history significant for new onset atrial flutter with rapid ventricular response in September 2020.  At the time was also found to be in severe hyperglycemia.  He had a new diagnosis of type 2 diabetes.  Hemoglobin A1c was 11.5 but subsequently after treatment has come down to 6.7.  He did undergo successful back surgery.  He is now in a brace which will have to wear for several months.  He denies any recurrent atrial flutter, palpitations, chest pain or other associated symptoms.  He is anticoagulated on Eliquis.  Blood pressure is at goal today.  We discussed the possibility of flutter ablation however he wants to consider this further.  02/12/2021  Mr. Lindahl is seen today in follow-up.  Overall he says he is doing well.  He continues to follow with Dr. Lyda Jester.  He is on Eliquis without bleeding issues.  He has had no recurrent atrial flutter that he is aware of.  Blood pressures well controlled today.  Weight is normal.  He is on treatment for recently diagnosed diabetes.  A1c is 5.8.  He is on Metformin and [place].  He also takes low-dose atorvastatin and his LDL was 46 in November.  PMHx:  Past Medical History:  Diagnosis Date   Arthritis    Asthma    as child   Atrial flutter with rapid ventricular response ([place]) 08/20/2019   Lumbar disc herniation 11/01/2019   New onset type 2 diabetes mellitus ([place]) 08/20/2019   Pneumonia    history of    Past Surgical History:  Procedure Laterality Date   ANTERIOR CERVICAL DECOMP/DISCECTOMY FUSION N/A 03/01/2013   Procedure: Cervical Four-Five Cervical Five-Six Cervical Six-Seven anterior cervical decompression with fusion plating and bonegraft;  Surgeon: Hosie Spangle, MD;  Location: [place] NEURO ORS;  Service: Neurosurgery;  Laterality: N/A;  ANTERIOR CERVICAL  DECOMPRESSION/DISCECTOMY FUSION 3 LEVELS   CARDIOVERSION N/A 08/22/2019   Procedure: CARDIOVERSION;  Surgeon: Donato Heinz, MD;  Location: [facility] ENDOSCOPY;  Service: Endoscopy;  Laterality: N/A;   TEE WITHOUT CARDIOVERSION N/A 08/22/2019   Procedure: TRANSESOPHAGEAL ECHOCARDIOGRAM (TEE);  Surgeon: Donato Heinz, MD;  Location: [facility] ENDOSCOPY;  Service: Endoscopy;  Laterality: N/A;    FAMHx:  Family History  Problem Relation Age of Onset   Cirrhosis Mother    Cancer Father     SOCHx:   reports that he quit smoking about 28 years ago. He has never used smokeless tobacco. He reports that he does not drink alcohol and does not use drugs.  ALLERGIES:  No Known Allergies  ROS: Pertinent items noted in HPI and remainder of comprehensive ROS otherwise negative.  HOME MEDS: Current Outpatient Medications on File Prior to Visit  Medication Sig Dispense Refill   apixaban (ELIQUIS) 5 MG TABS tablet Take 1 tablet (5 mg total) by mouth 2 (two) times daily. 180 tablet 1   atorvastatin (LIPITOR) 10 MG tablet Take 1 tablet (10 mg total) by mouth daily. 90 tablet 1   BD PEN NEEDLE NANO 2ND GEN 32G X 4 MM MISC Use with Tyler Aas Flextouch 100 each 11   insulin degludec (TRESIBA FLEXTOUCH) 100 UNIT/ML FlexTouch Pen Inject 10 Units into the skin daily. 15 mL 3   metFORMIN (GLUCOPHAGE) 1000 MG tablet Take 1 tablet (1,000 mg total) by mouth 2 (two) times  daily with a meal. 180 tablet 1   metoprolol tartrate (LOPRESSOR) 25 MG tablet Take 0.5 tablets (12.5 mg total) by mouth daily. 60 tablet 1   No current facility-administered medications on file prior to visit.    LABS/IMAGING: No results found for this or any previous visit (from the past 48 hour(s)). No results found.  LIPID PANEL:    Component Value Date/Time   CHOL 102 10/01/2020 0927   TRIG 105 10/01/2020 0927   HDL 36 (L) 10/01/2020 0927   CHOLHDL 2.8 10/01/2020 0927   LDLCALC 46 10/01/2020 0927     WEIGHTS: Wt  Readings from Last 3 Encounters:  02/12/21 187 lb (84.8 kg)  01/27/21 187 lb 3.2 oz (84.9 kg)  10/01/20 186 lb (84.4 kg)    VITALS: BP 117/64    Pulse (!) 54    Ht 6\' 3"  (1.905 m)    Wt 187 lb (84.8 kg)    SpO2 100%    BMI 23.37 kg/m   EXAM: General appearance: alert and no distress Neck: no carotid bruit, no JVD and thyroid not enlarged, symmetric, no tenderness/mass/nodules Lungs: clear to auscultation bilaterally Heart: regular rate and rhythm Abdomen: soft, non-tender; bowel sounds normal; no masses,  no organomegaly Extremities: extremities normal, atraumatic, no cyanosis or edema and Back brace in place Pulses: 2+ and symmetric Skin: Skin color, texture, turgor normal. No rashes or lesions Neurologic: Grossly normal Psych: pleasant  EKG: Sinus bradycardia with sinus arrhythmia at 54  ASSESSMENT: 1. Paroxysmal atrial flutter 2. CHA2DS2-VASc score of 2-anticoagulated on Eliquis 3. Recent onset type 2 diabetes (hemoglobin A1c now 5.8)  PLAN: 1.   Mr. Winchell has paroxysmal atrial flutter but has not had any recurrence recently.  He is asymptomatic if he does.  He remains anticoagulated on Eliquis with a chads vas score of 2.  His hemoglobin A1c now is down to 5.8 on therapy.  He has dyslipidemia on low-dose atorvastatin and is well controlled with LDL in the 40s.  No medication changes today.  Follow-up with me annually or sooner as necessary.  Pixie Casino, MD, [facility], [place] Director of the [facility]  Card[facility]ovascular [facility] Diplomate of the American Board of Clinical Lipidology Attending Cardiologist  Direct Dial: [phone]   Fax: [phone]  Website:  [email or web address] 02/12/2021, 8:31 AM

## 2021-02-12 NOTE — Patient Instructions (Signed)

## 2021-02-26 IMAGING — RF DG C-ARM 1-60 MIN
1 series · 2 of 2 positions shown · non-contrast
Comparison: Earlier operative localization images obtained this
same date

CLINICAL DATA: Portable operative imaging provided L4-L5 posterior
lumbar spine fusion.

EXAM:
DG C-ARM 1-60 MIN; LUMBAR SPINE - 2-3 VIEW

[Series 1: run · 2 of 2 slices shown]
[im 1/2]
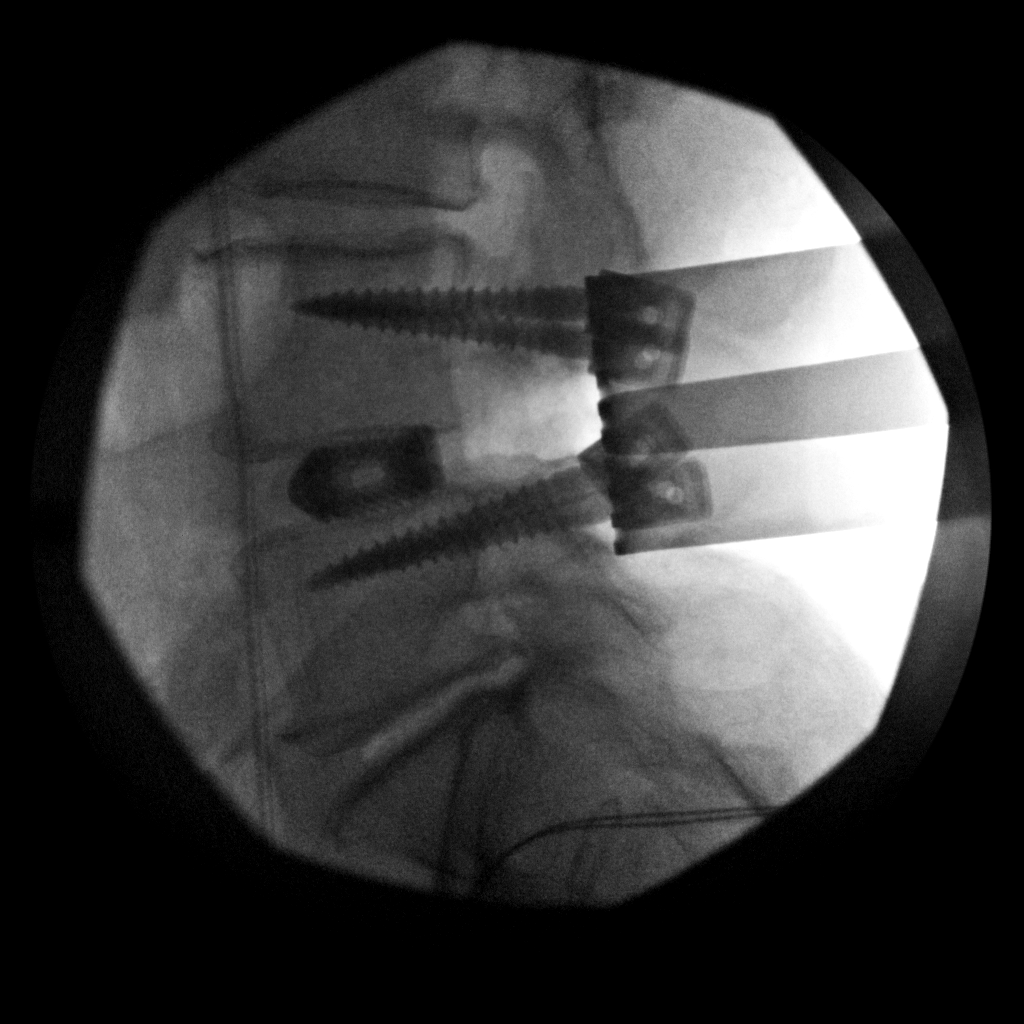
[im 2/2]
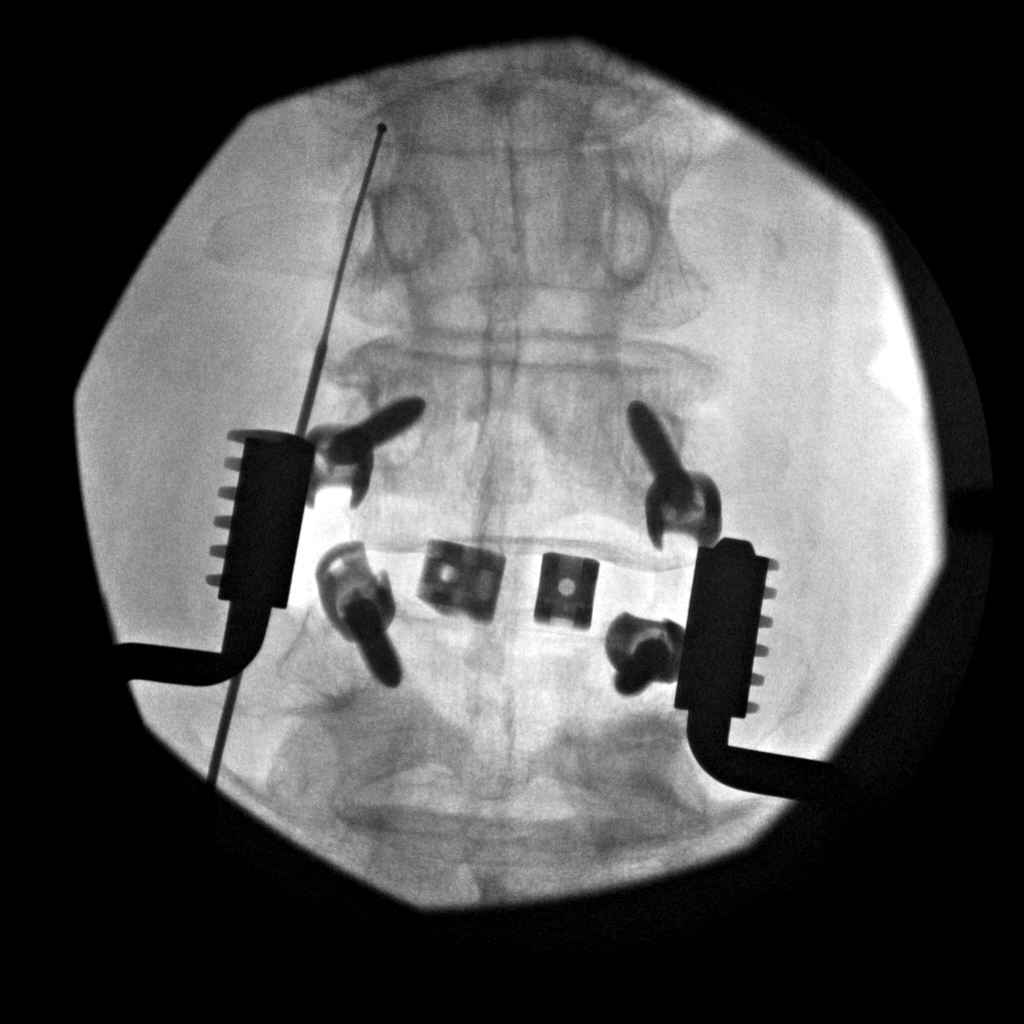

[2 of 2 positions shown; findings below may reference images not displayed]

FINDINGS: Two submitted images show placement bilateral pedicle screws at L4
and L5 and well-centered intervertebral cages at the L4-L5 disc
interspace. The orthopedic hardware appears well positioned and
well-seated.
IMPRESSION: Portable operative imaging provided for L4-L5 posterior lumbar spine
fusion.

## 2021-02-26 IMAGING — RF DG LUMBAR SPINE 2-3V
1 series · 2 of 2 positions shown · non-contrast
Comparison: Earlier operative localization images obtained this
same date

CLINICAL DATA: Portable operative imaging provided L4-L5 posterior
lumbar spine fusion.

EXAM:
DG C-ARM 1-60 MIN; LUMBAR SPINE - 2-3 VIEW

[Series 1: run · 2 of 2 slices shown]
[im 1/2]
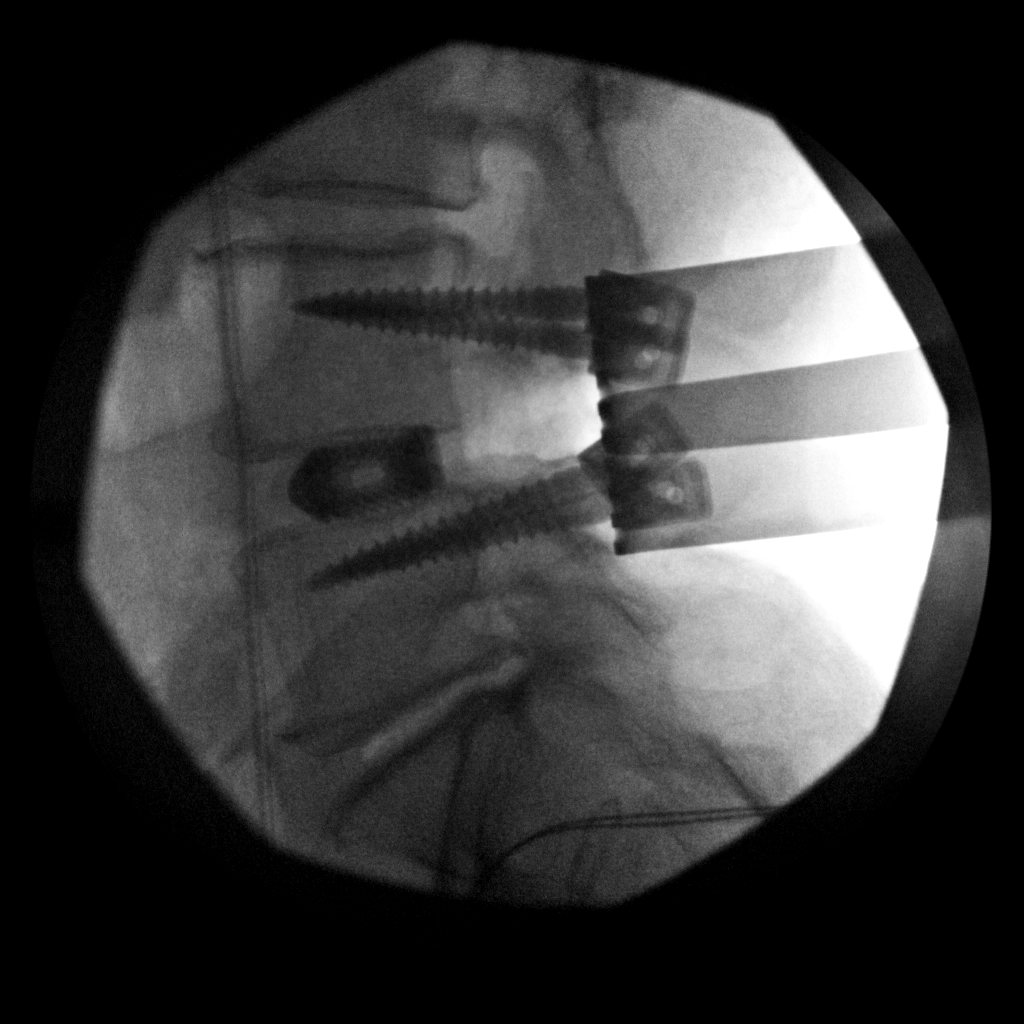
[im 2/2]
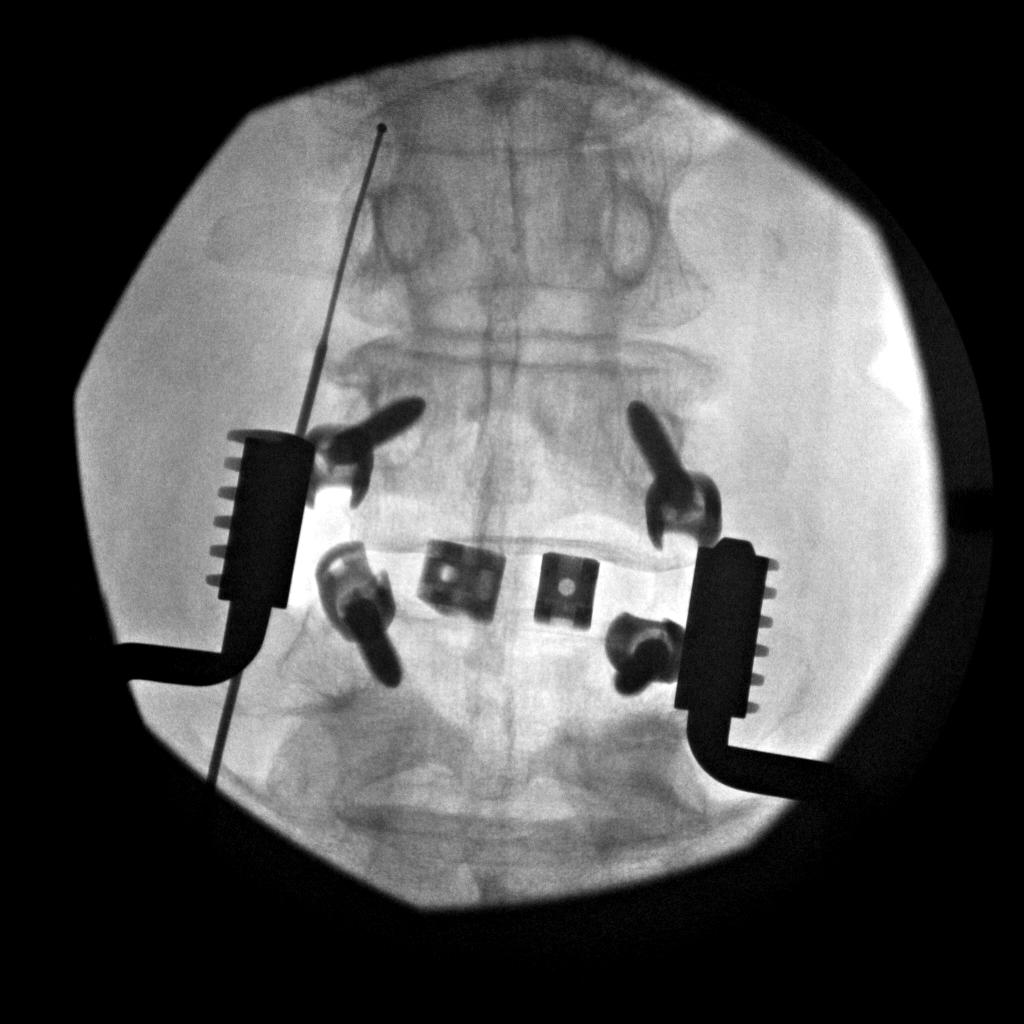

[2 of 2 positions shown; findings below may reference images not displayed]

FINDINGS: Two submitted images show placement bilateral pedicle screws at L4
and L5 and well-centered intervertebral cages at the L4-L5 disc
interspace. The orthopedic hardware appears well positioned and
well-seated.
IMPRESSION: Portable operative imaging provided for L4-L5 posterior lumbar spine
fusion.

## 2021-02-26 IMAGING — CR DG LUMBAR SPINE 2-3V
3 series · 3 of 3 positions shown · non-contrast
Comparison: 09/24/2019.

CLINICAL DATA: Lumbar surgery.

EXAM:
LUMBAR SPINE - 2-3 VIEW

[lateral (1 of 3)]
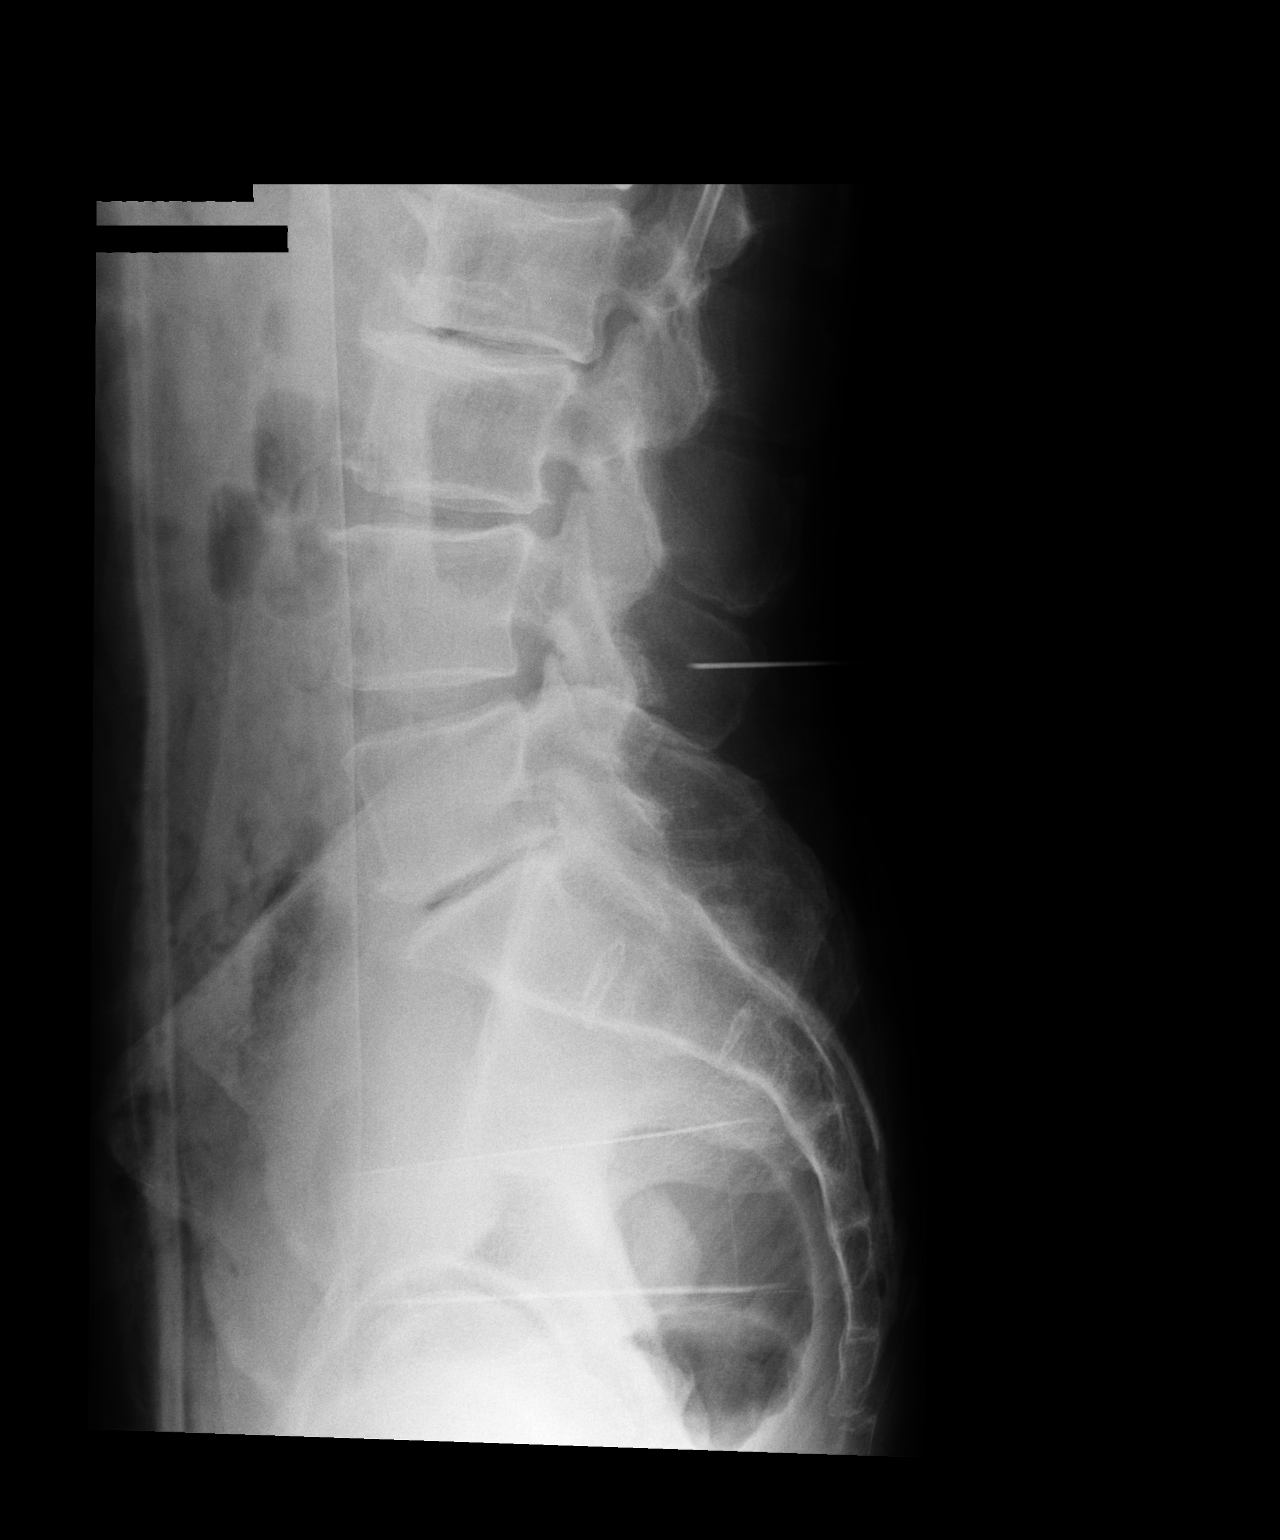

[lateral (2 of 3)]
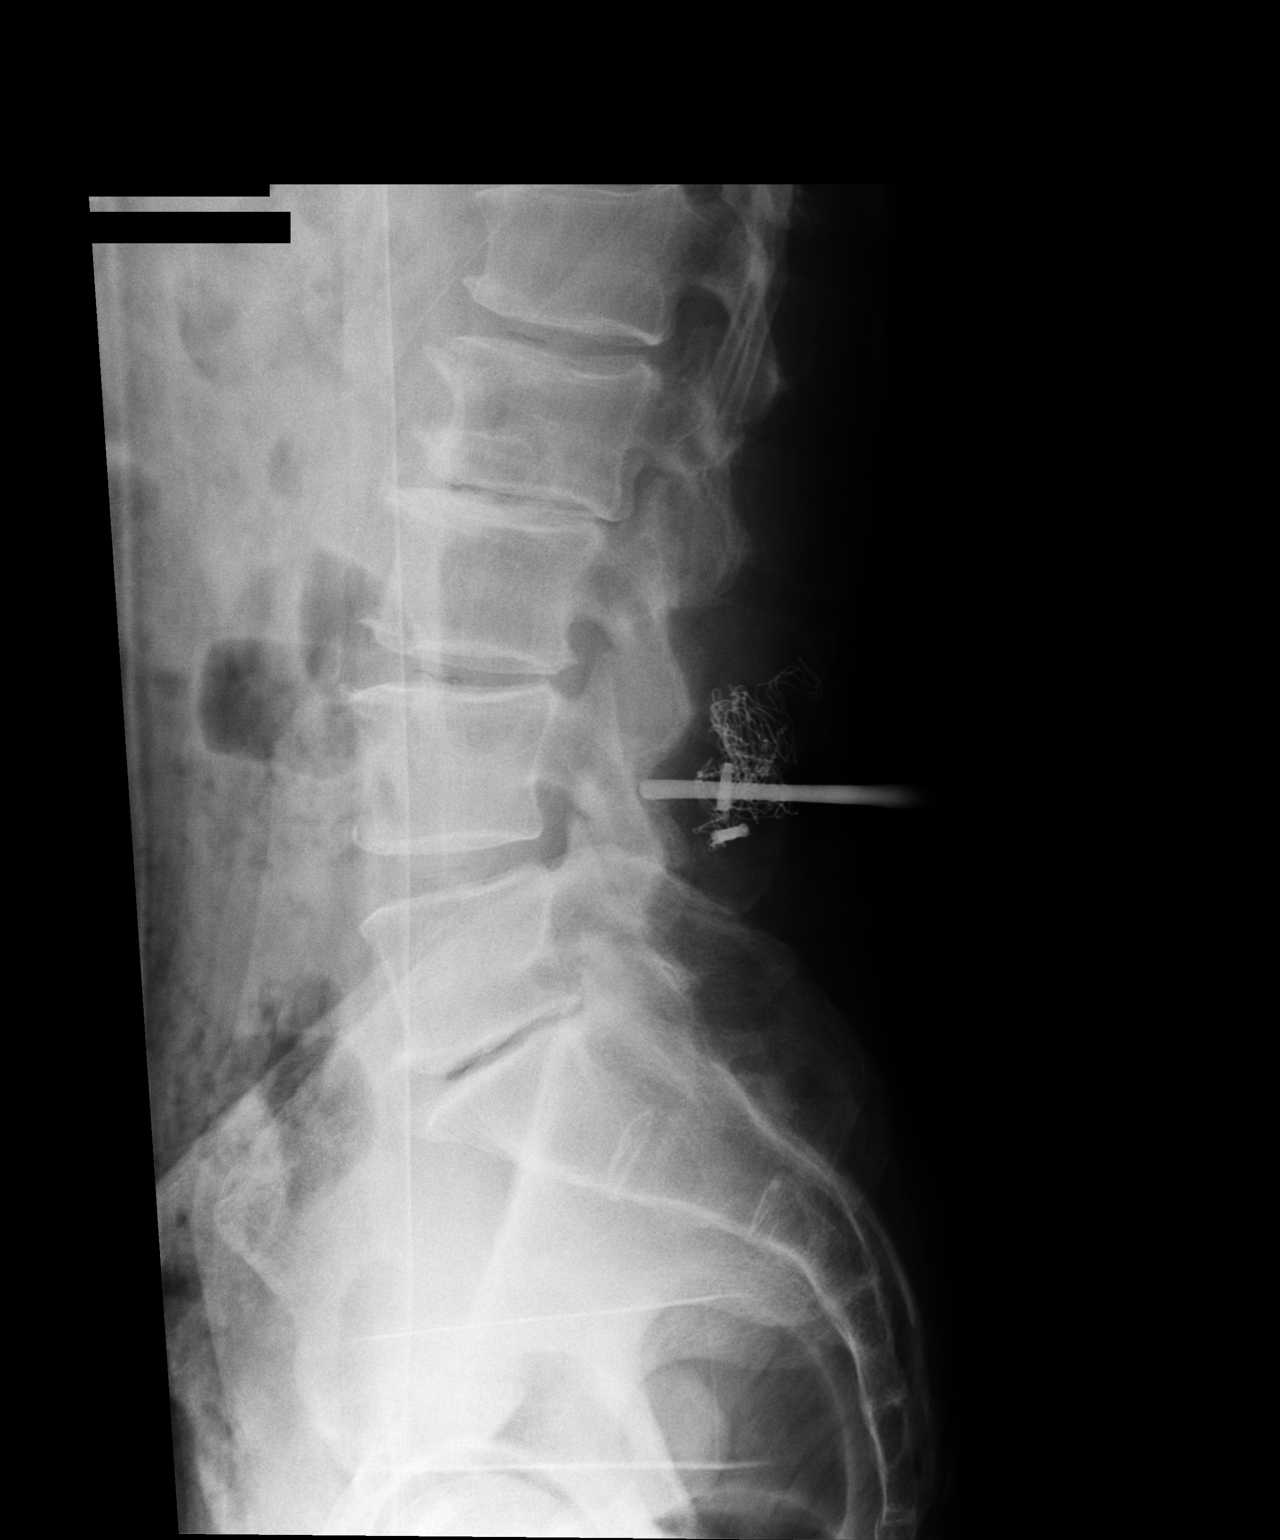

[lateral (3 of 3)]
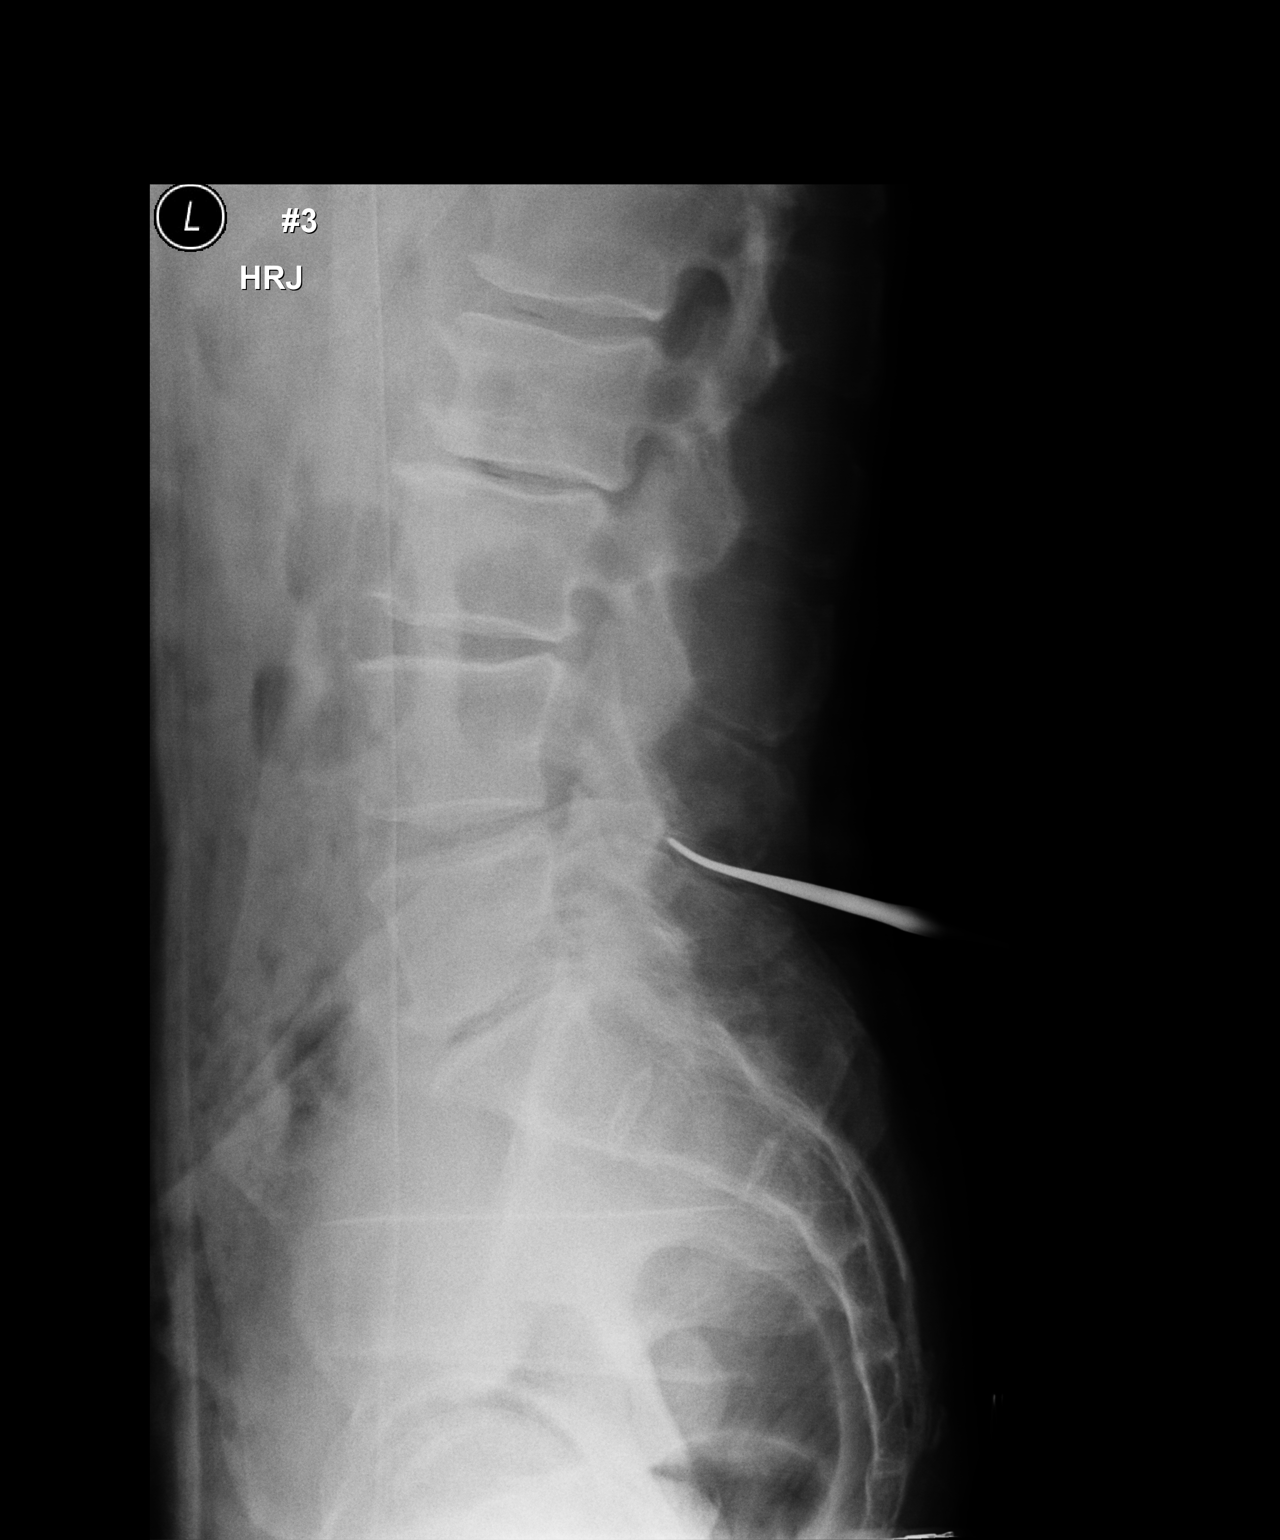

[3 of 3 positions shown; findings below may reference images not displayed]

FINDINGS: Lumbar spine numbered the lowest segmented appearing lumbar shaped
vertebrae on lateral view as L5. Metallic marker noted posteriorly
at the L4-L5 level. Surgical sponges noted on the second image.
These have been removed on the third image.
IMPRESSION: Metallic marker noted posteriorly at the L4-L5 level.

## 2021-06-03 ENCOUNTER — Telehealth: Payer: Medicare Other | Admitting: Critical Care Medicine

## 2021-07-14 ENCOUNTER — Telehealth: Payer: Medicare Other | Admitting: Critical Care Medicine

## 2021-08-25 ENCOUNTER — Ambulatory Visit: Payer: 59 | Attending: Critical Care Medicine | Admitting: Critical Care Medicine

## 2021-08-25 ENCOUNTER — Other Ambulatory Visit: Payer: Self-pay

## 2021-08-25 ENCOUNTER — Encounter: Payer: Self-pay | Admitting: Critical Care Medicine

## 2021-08-25 VITALS — BP 133/76 | HR 56 | Resp 16 | Wt 181.4 lb

## 2021-08-25 DIAGNOSIS — E1165 Type 2 diabetes mellitus with hyperglycemia: Secondary | ICD-10-CM

## 2021-08-25 DIAGNOSIS — E1169 Type 2 diabetes mellitus with other specified complication: Secondary | ICD-10-CM | POA: Diagnosis not present

## 2021-08-25 DIAGNOSIS — Z794 Long term (current) use of insulin: Secondary | ICD-10-CM | POA: Diagnosis not present

## 2021-08-25 DIAGNOSIS — K056 Periodontal disease, unspecified: Secondary | ICD-10-CM | POA: Diagnosis not present

## 2021-08-25 DIAGNOSIS — I480 Paroxysmal atrial fibrillation: Secondary | ICD-10-CM

## 2021-08-25 DIAGNOSIS — Z7901 Long term (current) use of anticoagulants: Secondary | ICD-10-CM

## 2021-08-25 DIAGNOSIS — E785 Hyperlipidemia, unspecified: Secondary | ICD-10-CM

## 2021-08-25 LAB — POCT GLYCOSYLATED HEMOGLOBIN (HGB A1C): HbA1c, POC (controlled diabetic range): 6 % (ref 0.0–7.0)

## 2021-08-25 LAB — GLUCOSE, POCT (MANUAL RESULT ENTRY): POC Glucose: 75 mg/dl (ref 70–99)

## 2021-08-25 MED ORDER — APIXABAN 5 MG PO TABS: 5.0000 mg | ORAL_TABLET | Freq: Two times a day (BID) | ORAL | 1 refills | Status: DC

## 2021-08-25 MED ORDER — TRESIBA FLEXTOUCH 100 UNIT/ML ~~LOC~~ SOPN: 10.0000 [IU] | PEN_INJECTOR | Freq: Every day | SUBCUTANEOUS | 3 refills | Status: DC

## 2021-08-25 MED ORDER — METOPROLOL TARTRATE 25 MG PO TABS: 12.5000 mg | ORAL_TABLET | Freq: Every day | ORAL | 1 refills | Status: DC

## 2021-08-25 MED ORDER — METFORMIN HCL 1000 MG PO TABS: 1000.0000 mg | ORAL_TABLET | Freq: Two times a day (BID) | ORAL | 1 refills | Status: DC

## 2021-08-25 MED ORDER — BD PEN NEEDLE NANO 2ND GEN 32G X 4 MM MISC: 11 refills | Status: DC

## 2021-08-25 MED ORDER — ATORVASTATIN CALCIUM 10 MG PO TABS: 10.0000 mg | ORAL_TABLET | Freq: Every day | ORAL | 1 refills | Status: DC

## 2021-08-25 NOTE — Assessment & Plan Note (Signed)
This has resolved.

## 2021-08-25 NOTE — Progress Notes (Signed)
Established Patient Office Visit  Subjective:  Patient ID: Cameron Arroyo, male    DOB: 08-11-1951  Age: 70 y.o. MRN: [id number]  CC:  Chief Complaint  Patient presents with   Diabetes    HPI Cameron Arroyo presents for primary care follow-up visit for paroxysmal atrial fibrillation, hyperlipidemia, type 2 diabetes.  Since last visit patient's been doing well and on arrival blood sugar is excellent at 75 and hemoglobin A1c is 6.0.  Patient brings his log with him blood pressures are in excellent ranges as are blood sugars on his logs.  Heart rate in the 70s.  Patient has no real new complaints.    Past Medical History:  Diagnosis Date   Arthritis    Asthma    as child   Atrial flutter with rapid ventricular response ([place]) 08/20/2019   Lumbar disc herniation 11/01/2019   New onset type 2 diabetes mellitus ([place]) 08/20/2019   Pneumonia    history of    Past Surgical History:  Procedure Laterality Date   ANTERIOR CERVICAL DECOMP/DISCECTOMY FUSION N/A 03/01/2013   Procedure: Cervical Four-Five Cervical Five-Six Cervical Six-Seven anterior cervical decompression with fusion plating and bonegraft;  Surgeon: Hosie Spangle, MD;  Location: [place] NEURO ORS;  Service: Neurosurgery;  Laterality: N/A;  ANTERIOR CERVICAL DECOMPRESSION/DISCECTOMY FUSION 3 LEVELS   CARDIOVERSION N/A 08/22/2019   Procedure: CARDIOVERSION;  Surgeon: Donato Heinz, MD;  [facility] ENDOSCOPY;  Service: Endoscopy;  Laterality: N/A;   TEE WITHOUT CARDIOVERSION N/A 08/22/2019   Procedure: TRANSESOPHAGEAL ECHOCARDIOGRAM (TEE);  Surgeon: Donato Heinz, MD;  [facility]: [facility];  Service: Endoscopy;  Laterality: N/A;    Family History  Problem Relation Age of Onset   Cirrhosis Mother    Cancer Father     Social History   Socioeconomic History   Marital status: Married    Spouse name: Not on file   Number of children: Not on file   Years of education: Not on file   Highest education  level: Not on file  Occupational History   Not on file  Tobacco Use   Smoking status: Former    Types: Cigarettes    Quit date: 11/29/1992    Years since quitting: 28.7   Smokeless tobacco: Never  Vaping Use   Vaping Use: Never used  Substance and Sexual Activity   Alcohol use: No   Drug use: Never   Sexual activity: Not Currently  Other Topics Concern   Not on file  Social History Narrative   Not on file   Social Determinants of Health   Financial Resource Strain: Not on file  Food Insecurity: Not on file  Transportation Needs: Not on file  Physical Activity: Not on file  Stress: Not on file  Social Connections: Not on file  Intimate Partner Violence: Not on file    Outpatient Medications Prior to Visit  Medication Sig Dispense Refill   apixaban (ELIQUIS) 5 MG TABS tablet Take 1 tablet (5 mg total) by mouth 2 (two) times daily. 180 tablet 1   atorvastatin (LIPITOR) 10 MG tablet Take 1 tablet (10 mg total) by mouth daily. 90 tablet 1   BD PEN NEEDLE NANO 2ND GEN 32G X 4 MM MISC Use with [place] Aas Flextouch 100 each 11   insulin degludec (TRESIBA FLEXTOUCH) 100 UNIT/ML FlexTouch Pen Inject 10 Units into the skin daily. 15 mL 3   metoprolol tartrate (LOPRESSOR) 25 MG tablet Take 0.5 tablets (12.5 mg total) by mouth daily. 60 tablet  1   metFORMIN (GLUCOPHAGE) 1000 MG tablet Take 1 tablet (1,000 mg total) by mouth 2 (two) times daily with a meal. 180 tablet 1   No facility-administered medications prior to visit.    No Known Allergies  ROS Review of Systems  Constitutional: Negative.   HENT: Negative.  Negative for ear pain, postnasal drip, rhinorrhea, sinus pressure, sore throat, trouble swallowing and voice change.   Eyes: Negative.   Respiratory: Negative.  Negative for apnea, cough, choking, chest tightness, shortness of breath, wheezing and stridor.   Cardiovascular: Negative.  Negative for chest pain, palpitations and leg swelling.  Gastrointestinal: Negative.   Negative for abdominal distention, abdominal pain, nausea and vomiting.  Genitourinary: Negative.   Musculoskeletal: Negative.  Negative for arthralgias and myalgias.  Skin: Negative.  Negative for rash.  Allergic/Immunologic: Negative.  Negative for environmental allergies and food allergies.  Neurological: Negative.  Negative for dizziness, syncope, weakness and headaches.  Hematological: Negative.  Negative for adenopathy. Does not bruise/bleed easily.  Psychiatric/Behavioral: Negative.  Negative for agitation and sleep disturbance. The patient is not nervous/anxious.      Objective:    Physical Exam Vitals reviewed.  Constitutional:      Appearance: Normal appearance. He is well-developed. He is not diaphoretic.  HENT:     Head: Normocephalic and atraumatic.     Nose: No nasal deformity, septal deviation, mucosal edema or rhinorrhea.     Right Sinus: No maxillary sinus tenderness or frontal sinus tenderness.     Left Sinus: No maxillary sinus tenderness or frontal sinus tenderness.     Mouth/Throat:     Pharynx: No oropharyngeal exudate.  Eyes:     General: No scleral icterus.    Conjunctiva/sclera: Conjunctivae normal.     Pupils: Pupils are equal, round, and reactive to light.  Neck:     Thyroid: No thyromegaly.     Vascular: No carotid bruit or JVD.     Trachea: Trachea normal. No tracheal tenderness or tracheal deviation.  Cardiovascular:     Rate and Rhythm: Normal rate and regular rhythm.     Chest Wall: PMI is not displaced.     Pulses: Normal pulses. No decreased pulses.     Heart sounds: Normal heart sounds, S1 normal and S2 normal. Heart sounds not distant. No murmur heard. No systolic murmur is present.  No diastolic murmur is present.    No friction rub. No gallop. No S3 or S4 sounds.  Pulmonary:     Effort: No tachypnea, accessory muscle usage or respiratory distress.     Breath sounds: No stridor. No decreased breath sounds, wheezing, rhonchi or rales.   Chest:     Chest wall: No tenderness.  Abdominal:     General: Bowel sounds are normal. There is no distension.     Palpations: Abdomen is soft. Abdomen is not rigid.     Tenderness: There is no abdominal tenderness. There is no guarding or rebound.  Musculoskeletal:        General: Normal range of motion.     Cervical back: Normal range of motion and neck supple. No edema, erythema or rigidity. No muscular tenderness. Normal range of motion.  Lymphadenopathy:     Head:     Right side of head: No submental or submandibular adenopathy.     Left side of head: No submental or submandibular adenopathy.     Cervical: No cervical adenopathy.  Skin:    General: Skin is warm and dry.  Coloration: Skin is not pale.     Findings: No rash.     Nails: There is no clubbing.  Neurological:     Mental Status: He is alert and oriented to person, place, and time.     Sensory: No sensory deficit.  Psychiatric:        Speech: Speech normal.        Behavior: Behavior normal.    BP 133/76   Pulse (!) 56   Resp 16   Wt 181 lb 6.4 oz (82.3 kg)   SpO2 99%   BMI 22.67 kg/m  Wt Readings from Last 3 Encounters:  08/25/21 181 lb 6.4 oz (82.3 kg)  02/12/21 187 lb (84.8 kg)  01/27/21 187 lb 3.2 oz (84.9 kg)     Health Maintenance Due  Topic Date Due   Zoster Vaccines- Shingrix (1 of 2) Never done   OPHTHALMOLOGY EXAM  02/19/2021    There are no preventive care reminders to display for this patient.  Lab Results  Component Value Date   TSH 1.194 08/20/2019   Lab Results  Component Value Date   WBC 7.0 10/01/2020   HGB 14.5 10/01/2020   HCT 43.5 10/01/2020   MCV 93 10/01/2020   PLT 229 10/01/2020   Lab Results  Component Value Date   NA 140 10/01/2020   K 4.9 10/01/2020   CO2 24 10/01/2020   GLUCOSE 112 (H) 10/01/2020   BUN 15 10/01/2020   CREATININE 0.70 (L) 10/01/2020   BILITOT 0.8 10/01/2020   ALKPHOS 77 10/01/2020   AST 9 10/01/2020   ALT 13 10/01/2020   PROT 6.4  10/01/2020   ALBUMIN 4.4 10/01/2020   CALCIUM 9.6 10/01/2020   ANIONGAP 13 10/30/2019   Lab Results  Component Value Date   CHOL 102 10/01/2020   Lab Results  Component Value Date   HDL 36 (L) 10/01/2020   Lab Results  Component Value Date   LDLCALC 46 10/01/2020   Lab Results  Component Value Date   TRIG 105 10/01/2020   Lab Results  Component Value Date   CHOLHDL 2.8 10/01/2020   Lab Results  Component Value Date   HGBA1C 6.0 08/25/2021      Assessment & Plan:   Problem List Items Addressed This Visit       Cardiovascular and Mediastinum   PAF (paroxysmal atrial fibrillation) ([place])    No recurrence of atrial fibrillation will continue low-dose metoprolol and Eliquis to be continued.  He has a low [place] vas 2 score  Follow-up metabolic profile and blood counts since he is on chronic anticoagulation      Relevant Medications   apixaban (ELIQUIS) 5 MG TABS tablet   atorvastatin (LIPITOR) 10 MG tablet   metoprolol tartrate (LOPRESSOR) 25 MG tablet     Digestive   Periodontal disease    This has resolved        Endocrine   Type 2 diabetes mellitus, controlled ([place]) - Primary    Controlled type 2 diabetes continue metformin and Tresiba as prescribed      Relevant Medications   atorvastatin (LIPITOR) 10 MG tablet   BD PEN NEEDLE NANO 2ND GEN 32G X 4 MM MISC   insulin degludec (TRESIBA FLEXTOUCH) 100 UNIT/ML FlexTouch Pen   metFORMIN (GLUCOPHAGE) 1000 MG tablet   Other Relevant Orders   POCT glucose (manual entry) (Completed)   POCT glycosylated hemoglobin (Hb A1C) (Completed)   Comprehensive metabolic panel   Hyperlipidemia associated with type 2 diabetes  mellitus ([place])    Recheck lipid panel and continue atorvastatin low-dose      Relevant Medications   atorvastatin (LIPITOR) 10 MG tablet   insulin degludec (TRESIBA FLEXTOUCH) 100 UNIT/ML FlexTouch Pen   metFORMIN (GLUCOPHAGE) 1000 MG tablet   Other Relevant Orders   Lipid panel     Other    Chronic anticoagulation    Check blood counts continue Eliquis      Relevant Orders   CBC with Differential/Platelet    Meds ordered this encounter  Medications   apixaban (ELIQUIS) 5 MG TABS tablet    Sig: Take 1 tablet (5 mg total) by mouth 2 (two) times daily.    Dispense:  180 tablet    Refill:  1   atorvastatin (LIPITOR) 10 MG tablet    Sig: Take 1 tablet (10 mg total) by mouth daily.    Dispense:  90 tablet    Refill:  1   BD PEN NEEDLE NANO 2ND GEN 32G X 4 MM MISC    Sig: Use with [place] Flextouch    Dispense:  100 each    Refill:  11   insulin degludec (TRESIBA FLEXTOUCH) 100 UNIT/ML FlexTouch Pen    Sig: Inject 10 Units into the skin daily.    Dispense:  15 mL    Refill:  3   metoprolol tartrate (LOPRESSOR) 25 MG tablet    Sig: Take 0.5 tablets (12.5 mg total) by mouth daily.    Dispense:  60 tablet    Refill:  1   metFORMIN (GLUCOPHAGE) 1000 MG tablet    Sig: Take 1 tablet (1,000 mg total) by mouth 2 (two) times daily with a meal.    Dispense:  180 tablet    Refill:  1    Follow-up: Return in about 5 months (around 01/25/2022).    Asencion Noble, MD

## 2021-08-25 NOTE — Patient Instructions (Signed)
Stop by the lab for complete lab screen today  No change in medications  If you feel low in blood sugar such as edginess or lightheadedness please check your blood sugar and let us know if you are less than 70  Please get your eyes checked sometime over the next 6 to 10 months  Return to see Dr. Joya Gaskins 5 months

## 2021-08-25 NOTE — Assessment & Plan Note (Signed)
Recheck lipid panel and continue atorvastatin low-dose

## 2021-08-25 NOTE — Assessment & Plan Note (Signed)
Check blood counts continue Eliquis

## 2021-08-25 NOTE — Assessment & Plan Note (Signed)
No recurrence of atrial fibrillation will continue low-dose metoprolol and Eliquis to be continued.  He has a low [place] vas 2 score  Follow-up metabolic profile and blood counts since he is on chronic anticoagulation

## 2021-08-25 NOTE — Assessment & Plan Note (Signed)
Controlled type 2 diabetes continue metformin and Tresiba as prescribed

## 2021-08-26 LAB — COMPREHENSIVE METABOLIC PANEL
ALT: 10 IU/L (ref 0–44)
AST: 12 IU/L (ref 0–40)
Albumin/Globulin Ratio: 2.5 — ABNORMAL HIGH (ref 1.2–2.2)
Albumin: 4.7 g/dL (ref 3.8–4.8)
Alkaline Phosphatase: 75 IU/L (ref 44–121)
BUN/Creatinine Ratio: 21 (ref 10–24)
BUN: 14 mg/dL (ref 8–27)
Bilirubin Total: 0.6 mg/dL (ref 0.0–1.2)
CO2: 24 mmol/L (ref 20–29)
Calcium: 9.3 mg/dL (ref 8.6–10.2)
Chloride: 103 mmol/L (ref 96–106)
Creatinine, Ser: 0.67 mg/dL — ABNORMAL LOW (ref 0.76–1.27)
Globulin, Total: 1.9 g/dL (ref 1.5–4.5)
Glucose: 94 mg/dL (ref 70–99)
Potassium: 5.2 mmol/L (ref 3.5–5.2)
Sodium: 144 mmol/L (ref 134–144)
Total Protein: 6.6 g/dL (ref 6.0–8.5)
eGFR: 101 mL/min/{1.73_m2} (ref >59)

## 2021-08-26 LAB — LIPID PANEL
Chol/HDL Ratio: 2.8 ratio (ref 0.0–5.0)
Cholesterol, Total: 111 mg/dL (ref 100–199)
HDL: 39 mg/dL — ABNORMAL LOW (ref >39)
LDL Chol Calc (NIH): 56 mg/dL (ref 0–99)
Triglycerides: 76 mg/dL (ref 0–149)
VLDL Cholesterol Cal: 16 mg/dL (ref 5–40)

## 2021-08-26 LAB — CBC WITH DIFFERENTIAL/PLATELET
Basophils Absolute: 0.1 10*3/uL (ref 0.0–0.2)
Basos: 1 %
EOS (ABSOLUTE): 0.1 10*3/uL (ref 0.0–0.4)
Eos: 1 %
Hematocrit: 42.4 % (ref 37.5–51.0)
Hemoglobin: 14.3 g/dL (ref 13.0–17.7)
Immature Grans (Abs): 0 10*3/uL (ref 0.0–0.1)
Immature Granulocytes: 0 %
Lymphocytes Absolute: 2 10*3/uL (ref 0.7–3.1)
Lymphs: 29 %
MCH: 30.6 pg (ref 26.6–33.0)
MCHC: 33.7 g/dL (ref 31.5–35.7)
MCV: 91 fL (ref 79–97)
Monocytes Absolute: 0.6 10*3/uL (ref 0.1–0.9)
Monocytes: 9 %
Neutrophils Absolute: 4.2 10*3/uL (ref 1.4–7.0)
Neutrophils: 60 %
Platelets: 233 10*3/uL (ref 150–450)
RBC: 4.68 x10E6/uL (ref 4.14–5.80)
RDW: 11.5 % — ABNORMAL LOW (ref 11.6–15.4)
WBC: 6.9 10*3/uL (ref 3.4–10.8)

## 2021-09-01 ENCOUNTER — Telehealth: Payer: Self-pay

## 2021-09-01 NOTE — Telephone Encounter (Signed)
Contacted pt to go over lab results pt is aware and doesn't have any questions or concerns 

## 2022-01-23 NOTE — Progress Notes (Signed)
Established Patient Office Visit  Subjective:  Patient ID: Cameron Arroyo, male    DOB: [birth year]  Age: 71 y.o. MRN: [id number]  CC:  Chief Complaint  Patient presents with   Diabetes    HPI [name] presents for diabetes follow-up.  The patient brings with him a diary blood pressures are in the 100-110/70-80 range at home blood sugars fasting in the 100 range he never has a blood sugar less than 70 and postprandials in the 140 range.  Blood sugar on arrival 101 and blood pressure 114/71.  Patient tends to get up at 7:30 AM and will have a light breakfast at 9:30 AM which includes oatmeal he will have a light snack at 10:30 AM then he will have lunch at noon the wife says occasionally is just popcorn he sometimes skips dinner sometimes he will have a midafternoon dinner which will include ham butter beans and sweet potatoes he does not eat much in the way of salads and does not exercise.  Wife said he sits and watches TV all day and is very hard of hearing.  He has had no tachycardia or issues with his blood pressure or his heart.  He is having no bleeding on Eliquis.  Today the patient needs an A1c and a urine microalbumin he does not need any other labs at this visit.  The patient did declined to receive the pneumococcal vaccine.  He also does not wish to receive the shingles vaccine.  A1C 6.2 this visit   Past Medical History:  Diagnosis Date   Arthritis    Asthma    as child   Atrial flutter with rapid ventricular response ([place]) 08/20/2019   Lumbar disc herniation 11/01/2019   New onset type 2 diabetes mellitus ([place]) 08/20/2019   Pneumonia    history of    Past Surgical History:  Procedure Laterality Date   ANTERIOR CERVICAL DECOMP/DISCECTOMY FUSION N/A 03/01/2013   Procedure: Cervical Four-Five Cervical Five-Six Cervical Six-Seven anterior cervical decompression with fusion plating and bonegraft;  Surgeon: Hosie Spangle, MD;  Location: [facility];  Service:  Neurosurgery;  Laterality: N/A;  ANTERIOR CERVICAL DECOMPRESSION/DISCECTOMY FUSION 3 LEVELS   CARDIOVERSION N/A 08/22/2019   Procedure: CARDIOVERSION;  Surgeon: Donato Heinz, MD;  Location[facility] END[facility]COPY;  Service: Endoscopy;  Laterality: N/A;   TEE WITHOUT CARDIOVERSION N/A 08/22/2019   Procedure: TRANSESOPHAGEAL ECHOCARDIOGRAM (TEE);  Surgeon: Donato Heinz, MD;  Location: [facility] ENDOSCOPY;  Service: Endoscopy;  Laterality: N/A;    Family History  Problem Relation Age of Onset   Cirrhosis Mother    Cancer Father     Social History   Socioeconomic History   Marital status: Married    Spouse name: Not on file   Number of children: Not on file   Years of education: Not on file   Highest education level: Not on file  Occupational History   Not on file  Tobacco Use   Smoking status: Former    Types: Cigarettes    Quit date: 11/29/1992    Years since quitting: 29.1   Smokeless tobacco: Never  Vaping Use   Vaping Use: Never used  Substance and Sexual Activity   Alcohol use: No   Drug use: Never   Sexual activity: Not Currently  Other Topics Concern   Not on file  Social History Narrative   Not on file   Social Determinants of Health   Financial Resource Strain: Not on file  Food Insecurity:  Not on file  Transportation Needs: Not on file  Physical Activity: Not on file  Stress: Not on file  Social Connections: Not on file  Intimate Partner Violence: Not on file    Outpatient Medications Prior to Visit  Medication Sig Dispense Refill   apixaban (ELIQUIS) 5 MG TABS tablet Take 1 tablet (5 mg total) by mouth 2 (two) times daily. 180 tablet 1   atorvastatin (LIPITOR) 10 MG tablet Take 1 tablet (10 mg total) by mouth daily. 90 tablet 1   BD PEN NEEDLE NANO 2ND GEN 32G X 4 MM MISC Use with [place] Flextouch 100 each 11   insulin degludec (TRESIBA FLEXTOUCH) 100 UNIT/ML FlexTouch Pen Inject 10 Units into the skin daily. 15 mL 3   metFORMIN (GLUCOPHAGE) 1000 MG  tablet Take 1 tablet (1,000 mg total) by mouth 2 (two) times daily with a meal. 180 tablet 1   metoprolol tartrate (LOPRESSOR) 25 MG tablet Take 0.5 tablets (12.5 mg total) by mouth daily. 60 tablet 1   niacin (NIASPAN) 1000 MG CR tablet Niaspan ER     No facility-administered medications prior to visit.    No Known Allergies  ROS Review of Systems  Constitutional: Negative.   HENT:  Positive for hearing loss. Negative for ear pain, postnasal drip, rhinorrhea, sinus pressure, sore throat, trouble swallowing and voice change.   Eyes: Negative.   Respiratory: Negative.  Negative for apnea, cough, choking, chest tightness, shortness of breath, wheezing and stridor.   Cardiovascular: Negative.  Negative for chest pain, palpitations and leg swelling.  Gastrointestinal: Negative.  Negative for abdominal distention, abdominal pain, nausea and vomiting.  Genitourinary: Negative.   Musculoskeletal: Negative.  Negative for arthralgias and myalgias.  Skin: Negative.  Negative for rash.  Allergic/Immunologic: Negative.  Negative for environmental allergies and food allergies.  Neurological: Negative.  Negative for dizziness, syncope, weakness and headaches.  Hematological: Negative.  Negative for adenopathy. Does not bruise/bleed easily.  Psychiatric/Behavioral: Negative.  Negative for agitation and sleep disturbance. The patient is not nervous/anxious.      Objective:    Physical Exam Vitals reviewed.  Constitutional:      Appearance: Normal appearance. He is well-developed. He is not diaphoretic.  HENT:     Head: Normocephalic and atraumatic.     Right Ear: Tympanic membrane, ear canal and external ear normal. There is no impacted cerumen.     Left Ear: Tympanic membrane, ear canal and external ear normal. There is no impacted cerumen.     Nose: Nose normal. No nasal deformity, septal deviation, mucosal edema or rhinorrhea.     Right Sinus: No maxillary sinus tenderness or frontal sinus  tenderness.     Left Sinus: No maxillary sinus tenderness or frontal sinus tenderness.     Mouth/Throat:     Mouth: Mucous membranes are moist.     Pharynx: Oropharynx is clear. No oropharyngeal exudate.  Eyes:     General: No scleral icterus.    Conjunctiva/sclera: Conjunctivae normal.     Pupils: Pupils are equal, round, and reactive to light.  Neck:     Thyroid: No thyromegaly.     Vascular: No carotid bruit or JVD.     Trachea: Trachea normal. No tracheal tenderness or tracheal deviation.  Cardiovascular:     Rate and Rhythm: Normal rate and regular rhythm.     Chest Wall: PMI is not displaced.     Pulses: Normal pulses. No decreased pulses.     Heart sounds: Normal heart sounds,  S1 normal and S2 normal. Heart sounds not distant. No murmur heard. No systolic murmur is present.  No diastolic murmur is present.    No friction rub. No gallop. No S3 or S4 sounds.  Pulmonary:     Effort: No tachypnea, accessory muscle usage or respiratory distress.     Breath sounds: No stridor. No decreased breath sounds, wheezing, rhonchi or rales.  Chest:     Chest wall: No tenderness.  Abdominal:     General: Bowel sounds are normal. There is no distension.     Palpations: Abdomen is soft. Abdomen is not rigid.     Tenderness: There is no abdominal tenderness. There is no guarding or rebound.  Musculoskeletal:        General: Normal range of motion.     Cervical back: Normal range of motion and neck supple. No edema, erythema or rigidity. No muscular tenderness. Normal range of motion.     Comments: Normal foot exam  Lymphadenopathy:     Head:     Right side of head: No submental or submandibular adenopathy.     Left side of head: No submental or submandibular adenopathy.     Cervical: No cervical adenopathy.  Skin:    General: Skin is warm and dry.     Coloration: Skin is not pale.     Findings: No rash.     Nails: There is no clubbing.  Neurological:     General: No focal deficit  present.     Mental Status: He is alert and oriented to person, place, and time. Mental status is at baseline.     Sensory: No sensory deficit.  Psychiatric:        Speech: Speech normal.        Behavior: Behavior normal.    BP 114/71    Pulse (!) 50    Wt 181 lb 12.8 oz (82.5 kg)    SpO2 99%    BMI 22.72 kg/m  Wt Readings from Last 3 Encounters:  01/25/22 181 lb 12.8 oz (82.5 kg)  08/25/21 181 lb 6.4 oz (82.3 kg)  02/12/21 187 lb (84.8 kg)     Health Maintenance Due  Topic Date Due   OPHTHALMOLOGY EXAM  02/19/2021   URINE MICROALBUMIN  01/27/2022    There are no preventive care reminders to display for this patient.  Lab Results  Component Value Date   TSH 1.194 08/20/2019   Lab Results  Component Value Date   WBC 6.9 08/25/2021   HGB 14.3 08/25/2021   HCT 42.4 08/25/2021   MCV 91 08/25/2021   PLT 233 08/25/2021   Lab Results  Component Value Date   NA 144 08/25/2021   K 5.2 08/25/2021   CO2 24 08/25/2021   GLUCOSE 94 08/25/2021   BUN 14 08/25/2021   CREATININE 0.67 (L) 08/25/2021   BILITOT 0.6 08/25/2021   ALKPHOS 75 08/25/2021   AST 12 08/25/2021   ALT 10 08/25/2021   PROT 6.6 08/25/2021   ALBUMIN 4.7 08/25/2021   CALCIUM 9.3 08/25/2021   ANIONGAP 13 10/30/2019   EGFR 101 08/25/2021   Lab Results  Component Value Date   CHOL 111 08/25/2021   Lab Results  Component Value Date   HDL 39 (L) 08/25/2021   Lab Results  Component Value Date   LDLCALC 56 08/25/2021   Lab Results  Component Value Date   TRIG 76 08/25/2021   Lab Results  Component Value Date   CHOLHDL 2.8 08/25/2021  Lab Results  Component Value Date   HGBA1C 6.2 01/25/2022      Assessment & Plan:   Problem List Items Addressed This Visit       Cardiovascular and Mediastinum   PAF (paroxysmal atrial fibrillation) ([place])    History of atrial fibrillation currently in sinus rhythm rate controlled  Cardiology feels he needs to stay on Eliquis for life and low-dose  metoprolol      Relevant Medications   apixaban (ELIQUIS) 5 MG TABS tablet   atorvastatin (LIPITOR) 10 MG tablet   metoprolol tartrate (LOPRESSOR) 25 MG tablet     Endocrine   Type 2 diabetes mellitus, controlled (HCC) - Primary    A1c is pending blood sugar 101 we will continue [place] and metformin as prescribed  I gave the patient a lifestyle medicine handout and pointed out healthy diet food choices to include more fiber in his diet and to begin an exercise program and increase his social connections      Relevant Medications   atorvastatin (LIPITOR) 10 MG tablet   BD PEN NEEDLE NANO 2ND GEN 32G X 4 MM MISC   insulin degludec (TRESIBA FLEXTOUCH) 100 UNIT/ML FlexTouch Pen   metFORMIN (GLUCOPHAGE) 1000 MG tablet   Other Relevant Orders   Microalbumin / creatinine urine ratio   POCT glucose (manual entry) (Completed)   HgB A1c (Completed)   Hyperlipidemia associated with type 2 diabetes mellitus ([place])    Lipidemia it was controlled in September do not need to recheck again for 6 months      Relevant Medications   apixaban (ELIQUIS) 5 MG TABS tablet   atorvastatin (LIPITOR) 10 MG tablet   insulin degludec (TRESIBA FLEXTOUCH) 100 UNIT/ML FlexTouch Pen   metFORMIN (GLUCOPHAGE) 1000 MG tablet   metoprolol tartrate (LOPRESSOR) 25 MG tablet     Other   Chronic anticoagulation    No evidence of active bleeding      Decreased hearing of both ears    I recommended audiology evaluation the patient declined       Meds ordered this encounter  Medications   apixaban (ELIQUIS) 5 MG TABS tablet    Sig: Take 1 tablet (5 mg total) by mouth 2 (two) times daily.    Dispense:  180 tablet    Refill:  1   atorvastatin (LIPITOR) 10 MG tablet    Sig: Take 1 tablet (10 mg total) by mouth daily.    Dispense:  90 tablet    Refill:  1   BD PEN NEEDLE NANO 2ND GEN 32G X 4 MM MISC    Sig: Use with [place] Flextouch    Dispense:  100 each    Refill:  11   insulin degludec (TRESIBA  FLEXTOUCH) 100 UNIT/ML FlexTouch Pen    Sig: Inject 10 Units into the skin daily.    Dispense:  15 mL    Refill:  3   metFORMIN (GLUCOPHAGE) 1000 MG tablet    Sig: Take 1 tablet (1,000 mg total) by mouth 2 (two) times daily with a meal.    Dispense:  180 tablet    Refill:  1   metoprolol tartrate (LOPRESSOR) 25 MG tablet    Sig: Take 0.5 tablets (12.5 mg total) by mouth daily.    Dispense:  60 tablet    Refill:  1   Urine for microalbumin will be obtained A1c to be obtained  Recommended the patient to get an eye exam this calendar year Patient declined the Prevnar 20  pneumonia vaccine Refills on all medications given  Follow-up: Return in about 6 months (around 07/25/2022).    Asencion Noble, MD

## 2022-01-25 ENCOUNTER — Ambulatory Visit: Payer: Medicare HMO | Attending: Critical Care Medicine | Admitting: Critical Care Medicine

## 2022-01-25 ENCOUNTER — Other Ambulatory Visit: Payer: Self-pay

## 2022-01-25 ENCOUNTER — Encounter: Payer: Self-pay | Admitting: Critical Care Medicine

## 2022-01-25 VITALS — BP 114/71 | HR 50 | Wt 181.8 lb

## 2022-01-25 DIAGNOSIS — H9193 Unspecified hearing loss, bilateral: Secondary | ICD-10-CM

## 2022-01-25 DIAGNOSIS — I480 Paroxysmal atrial fibrillation: Secondary | ICD-10-CM | POA: Diagnosis not present

## 2022-01-25 DIAGNOSIS — E1169 Type 2 diabetes mellitus with other specified complication: Secondary | ICD-10-CM

## 2022-01-25 DIAGNOSIS — Z794 Long term (current) use of insulin: Secondary | ICD-10-CM

## 2022-01-25 DIAGNOSIS — E1165 Type 2 diabetes mellitus with hyperglycemia: Secondary | ICD-10-CM

## 2022-01-25 DIAGNOSIS — E785 Hyperlipidemia, unspecified: Secondary | ICD-10-CM

## 2022-01-25 DIAGNOSIS — Z7901 Long term (current) use of anticoagulants: Secondary | ICD-10-CM

## 2022-01-25 LAB — POCT GLYCOSYLATED HEMOGLOBIN (HGB A1C): HbA1c, POC (controlled diabetic range): 6.2 % (ref 0.0–7.0)

## 2022-01-25 LAB — GLUCOSE, POCT (MANUAL RESULT ENTRY): POC Glucose: 101 mg/dl — AB (ref 70–99)

## 2022-01-25 MED ORDER — APIXABAN 5 MG PO TABS: 5.0000 mg | ORAL_TABLET | Freq: Two times a day (BID) | ORAL | 1 refills | Status: DC

## 2022-01-25 MED ORDER — ATORVASTATIN CALCIUM 10 MG PO TABS: 10.0000 mg | ORAL_TABLET | Freq: Every day | ORAL | 1 refills | Status: DC

## 2022-01-25 MED ORDER — METOPROLOL TARTRATE 25 MG PO TABS: 12.5000 mg | ORAL_TABLET | Freq: Every day | ORAL | 1 refills | Status: DC

## 2022-01-25 MED ORDER — TRESIBA FLEXTOUCH 100 UNIT/ML ~~LOC~~ SOPN: 10.0000 [IU] | PEN_INJECTOR | Freq: Every day | SUBCUTANEOUS | 3 refills | Status: DC

## 2022-01-25 MED ORDER — BD PEN NEEDLE NANO 2ND GEN 32G X 4 MM MISC: 11 refills | Status: DC

## 2022-01-25 MED ORDER — METFORMIN HCL 1000 MG PO TABS: 1000.0000 mg | ORAL_TABLET | Freq: Two times a day (BID) | ORAL | 1 refills | Status: DC

## 2022-01-25 NOTE — Patient Instructions (Signed)
Please review the lifestyle medicine handout I gave you particular around nutrition exercise and social connections  I am willing to send you to audiology for hearing test please let us know and we can make the appointment  Urine for albumin was obtained at this visit  Please obtain an eye exam this year to have your retinas checked  Refills on all medications were sent to your pharmacy no changes in medicines  The foot exam was normal  Return to see Dr. Joya Gaskins 6 months  You declined to receive the pneumonia vaccine

## 2022-01-25 NOTE — Assessment & Plan Note (Signed)
I recommended audiology evaluation the patient declined

## 2022-01-25 NOTE — Assessment & Plan Note (Signed)
Lipidemia it was controlled in September do not need to recheck again for 6 months

## 2022-01-25 NOTE — Assessment & Plan Note (Signed)
No evidence of active bleeding

## 2022-01-25 NOTE — Assessment & Plan Note (Signed)
A1c is pending blood sugar 101 we will continue [place] and metformin as prescribed  I gave the patient a lifestyle medicine handout and pointed out healthy diet food choices to include more fiber in his diet and to begin an exercise program and increase his social connections

## 2022-01-25 NOTE — Assessment & Plan Note (Signed)
History of atrial fibrillation currently in sinus rhythm rate controlled  Cardiology feels he needs to stay on Eliquis for life and low-dose metoprolol

## 2022-01-26 ENCOUNTER — Telehealth: Payer: Self-pay

## 2022-01-26 LAB — MICROALBUMIN / CREATININE URINE RATIO
Creatinine, Urine: 183 mg/dL
Microalb/Creat Ratio: 6 mg/g creat (ref 0–29)
Microalbumin, Urine: 11 ug/mL

## 2022-01-26 NOTE — Telephone Encounter (Signed)
Pt was called and vm was left, Information has been sent to nurse pool.   

## 2022-01-26 NOTE — Telephone Encounter (Signed)
-----   Message from Elsie Stain, MD sent at 01/26/2022  4:44 PM EST ----- Let pt know his urine study was normal  no protein in urine

## 2022-02-03 ENCOUNTER — Telehealth: Payer: Self-pay | Admitting: Critical Care Medicine

## 2022-02-03 NOTE — Telephone Encounter (Signed)
Fyi  switch insulin ?

## 2022-02-03 NOTE — Telephone Encounter (Signed)
I did not switch the patient's insulin,  Cameron Arroyo can you sort this out? Cameron Arroyo is out this week. ? ?I refilled his tresiba , it may be that his [place] medicare for this plan year 2023 has changed its formulary and wants another long acting insulin?? ?

## 2022-02-03 NOTE — Telephone Encounter (Signed)
Copied from [place] [id number]. Topic: General - Other ?>> Feb 02, 2022  4:50 PM [place], [place] wrote: ?Reason for CRM:pt called in sates received call from office that that insulin he was swichted to swont be covered. Please call back about this ?

## 2022-02-04 ENCOUNTER — Other Ambulatory Visit: Payer: Self-pay

## 2022-02-04 NOTE — Telephone Encounter (Signed)
Called pt and he is aware of note, I told him to call his insurance company about the letter say med is not covered. Pharmacy said he's been paying $40 for meds and it says that its covered . ?

## 2022-02-04 NOTE — Telephone Encounter (Signed)
Call patient and tell him the copay for tresiba is 40 dollars and it was filled recently  is this too much for him to pay?? I can have our pharmacist explore other options with his health plan?? ?

## 2022-07-26 ENCOUNTER — Ambulatory Visit: Payer: Medicare HMO | Admitting: Critical Care Medicine

## 2022-09-24 ENCOUNTER — Other Ambulatory Visit: Payer: Self-pay | Admitting: Critical Care Medicine

## 2022-09-24 DIAGNOSIS — E1169 Type 2 diabetes mellitus with other specified complication: Secondary | ICD-10-CM

## 2022-10-04 ENCOUNTER — Other Ambulatory Visit: Payer: Self-pay | Admitting: Critical Care Medicine

## 2022-10-04 DIAGNOSIS — E1165 Type 2 diabetes mellitus with hyperglycemia: Secondary | ICD-10-CM

## 2022-10-04 NOTE — Telephone Encounter (Signed)
Medication Refill - Medication: metFORMIN (GLUCOPHAGE) 1000 MG tablet   Has the patient contacted their pharmacy? yes (Agent: If no, request that the patient contact the pharmacy for the refill. If patient does not wish to contact the pharmacy document the reason why and proceed with request.) (Agent: If yes, when and what did the pharmacy advise?)contact pcp  Preferred Pharmacy (with phone number or street name):  [id number] [place] [place] [address]: [phone] Fax: [phone]    Has the patient been seen for an appointment in the last year OR does the patient have an upcoming appointment?  Yes  Please be advised that RX refills may take up to 3 business days. We ask that you follow-up with your pharmacy

## 2022-10-05 MED ORDER — METFORMIN HCL 1000 MG PO TABS: 1000.0000 mg | ORAL_TABLET | Freq: Two times a day (BID) | ORAL | 0 refills | Status: DC

## 2022-10-05 NOTE — Telephone Encounter (Signed)
Requested medications are due for refill today.  yes  Requested medications are on the active medications list.  yes  Last refill. 01/25/2022 #180 1 rf  Future visit scheduled.   no  Notes to clinic.  Rx written to expire 07/24/2022 - Rx is expired.    Requested Prescriptions  Pending Prescriptions Disp Refills   metFORMIN (GLUCOPHAGE) 1000 MG tablet 180 tablet 1    Sig: Take 1 tablet (1,000 mg total) by mouth 2 (two) times daily with a meal.     Endocrinology:  Diabetes - Biguanides Failed - 10/04/2022 11:29 AM      Failed - Cr in normal range and within 360 days    Creatinine, Ser  Date Value Ref Range Status  08/25/2021 0.67 (L) 0.76 - 1.27 mg/dL Final         Failed - HBA1C is between 0 and 7.9 and within 180 days    HbA1c, POC (controlled diabetic range)  Date Value Ref Range Status  01/25/2022 6.2 0.0 - 7.0 % Final         Failed - eGFR in normal range and within 360 days    GFR calc Af Amer  Date Value Ref Range Status  10/01/2020 112 >59 mL/min/1.73 Final    Comment:    **In accordance with recommendations from the NKF-ASN Task force,**   [facility] is in the process of updating its eGFR calculation to the   2021 CKD-EPI creatinine equation that estimates kidney function   without a race variable.    GFR calc non Af Amer  Date Value Ref Range Status  10/01/2020 97 >59 mL/min/1.73 Final   eGFR  Date Value Ref Range Status  08/25/2021 101 >59 mL/min/1.73 Final         Failed - B12 Level in normal range and within 720 days    No results found for: "VITAMINB12"       Failed - Valid encounter within last 6 months    Recent Outpatient Visits           8 months ago Controlled type 2 diabetes mellitus with hyperglycemia, with long-term current use of insulin ([place])   [place] Elsie Stain, MD   1 year ago Controlled type 2 diabetes mellitus with hyperglycemia, with long-term current use of insulin [facility])   [place] Elsie Stain, MD   1 year ago Controlled type 2 diabetes mellitus with hyperglycemia, with long-term current use of insulin [facility])   [place] Elsie Stain, MD   2 years ago Controlled type 2 diabetes mellitus with hyperglycemia, with long-term current use of insulin [facility])   [place] Elsie Stain, MD   2 years ago Controlled type 2 diabetes mellitus with hyperglycemia, with long-term current use of insulin [facility])   [place], [place]       Future Appointments             In 1 month Elsie Stain, MD [place]            Failed - CBC within normal limits and completed in the last 12 months    WBC  Date Value Ref Range Status  08/25/2021 6.9 3.4 - 10.8 x10E3/uL Final  10/30/2019 5.8 4.0 - 10.5 K/uL Final   RBC  Date Value Ref Range Status  08/25/2021 4.68 4.14 - 5.80 x10E6/uL Final  10/30/2019 4.53 4.22 - 5.81 MIL/uL Final   Hemoglobin  Date Value Ref Range Status  08/25/2021 14.3 13.0 - 17.7 g/dL Final   Hematocrit  Date Value Ref Range Status  08/25/2021 42.4 37.5 - 51.0 % Final   MCHC  Date Value Ref Range Status  08/25/2021 33.7 31.5 - 35.7 g/dL Final  10/30/2019 32.6 30.0 - 36.0 g/dL Final   [facility]  Date Value Ref Range Status  08/25/2021 30.6 26.6 - 33.0 pg Final  10/30/2019 31.6 26.0 - 34.0 pg Final   MCV  Date Value Ref Range Status  08/25/2021 91 79 - 97 fL Final   No results found for: "PLTCOUNTKUC", "LABPLAT", "POCPLA" RDW  Date Value Ref Range Status  08/25/2021 11.5 (L) 11.6 - 15.4 % Final

## 2022-10-11 ENCOUNTER — Ambulatory Visit: Payer: Medicare HMO | Admitting: Physician Assistant

## 2022-10-11 ENCOUNTER — Encounter: Payer: Self-pay | Admitting: Physician Assistant

## 2022-10-11 ENCOUNTER — Ambulatory Visit: Payer: Self-pay

## 2022-10-11 VITALS — BP 120/66 | HR 63 | Wt 181.0 lb

## 2022-10-11 DIAGNOSIS — Z794 Long term (current) use of insulin: Secondary | ICD-10-CM

## 2022-10-11 DIAGNOSIS — E785 Hyperlipidemia, unspecified: Secondary | ICD-10-CM | POA: Diagnosis not present

## 2022-10-11 DIAGNOSIS — L0212 Furuncle of neck: Secondary | ICD-10-CM

## 2022-10-11 DIAGNOSIS — I480 Paroxysmal atrial fibrillation: Secondary | ICD-10-CM

## 2022-10-11 DIAGNOSIS — E1165 Type 2 diabetes mellitus with hyperglycemia: Secondary | ICD-10-CM | POA: Diagnosis not present

## 2022-10-11 DIAGNOSIS — E1169 Type 2 diabetes mellitus with other specified complication: Secondary | ICD-10-CM

## 2022-10-11 MED ORDER — METOPROLOL TARTRATE 25 MG PO TABS: 12.5000 mg | ORAL_TABLET | Freq: Every day | ORAL | 0 refills | Status: DC

## 2022-10-11 MED ORDER — APIXABAN 5 MG PO TABS: 5.0000 mg | ORAL_TABLET | Freq: Two times a day (BID) | ORAL | 0 refills | Status: DC

## 2022-10-11 MED ORDER — ATORVASTATIN CALCIUM 10 MG PO TABS: 10.0000 mg | ORAL_TABLET | Freq: Every day | ORAL | 0 refills | Status: DC

## 2022-10-11 MED ORDER — CEPHALEXIN 500 MG PO CAPS: 500.0000 mg | ORAL_CAPSULE | Freq: Four times a day (QID) | ORAL | 0 refills | Status: AC

## 2022-10-11 MED ORDER — METFORMIN HCL 1000 MG PO TABS: 1000.0000 mg | ORAL_TABLET | Freq: Two times a day (BID) | ORAL | 0 refills | Status: DC

## 2022-10-11 NOTE — Progress Notes (Unsigned)
Established Patient Office Visit  Subjective   Patient ID: Cameron Arroyo, male    DOB: 01/21/1951  Age: 71 y.o. MRN: [id number]  Chief Complaint  Patient presents with   Cellulitis    States that he has had a bump on the left backside of his neck that he describes as a "very small marble that has been there for at least three quarters of his life".  States that he started noticing it growing bigger over the past week, states that it was becoming irritated and tender to touch.  States that yesterday it "erupted" and blood and pus came out.  States that he has not tried anything for relief.  States that he has an upcoming appointment with his primary care provider at the beginning of the year, but is concerned that he will not have enough medication to last until then.  States that he has been checking his blood glucose levels at home, states that his blood glucose this morning was 132, but does describe that as elevated.  States they are generally between 101 05.  States that he has been checking his blood pressure at home, states that his readings have been within normal limits.    Past Medical History:  Diagnosis Date   Arthritis    Asthma    as child   Atrial flutter with rapid ventricular response ([place]) 08/20/2019   Lumbar disc herniation 11/01/2019   New onset type 2 diabetes mellitus ([place]) 08/20/2019   Pneumonia    history of   Social History   Socioeconomic History   Marital status: Married    Spouse name: Not on file   Number of children: Not on file   Years of education: Not on file   Highest education level: Not on file  Occupational History   Not on file  Tobacco Use   Smoking status: Former    Types: Cigarettes    Quit date: 11/29/1992    Years since quitting: 29.8   Smokeless tobacco: Never  Vaping Use   Vaping Use: Never used  Substance and Sexual Activity   Alcohol use: No   Drug use: Never   Sexual activity: Not Currently  Other Topics Concern   Not  on file  Social History Narrative   Not on file   Social Determinants of Health   Financial Resource Strain: Not on file  Food Insecurity: Not on file  Transportation Needs: Not on file  Physical Activity: Not on file  Stress: Not on file  Social Connections: Not on file  Intimate Partner Violence: Not on file   Family History  Problem Relation Age of Onset   Cirrhosis Mother    Cancer Father    No Known Allergies  Review of Systems  Constitutional:  Negative for chills and fever.  Eyes: Negative.   Respiratory:  Negative for shortness of breath.   Cardiovascular:  Negative for chest pain.  Gastrointestinal: Negative.   Genitourinary: Negative.   Musculoskeletal: Negative.   Skin: Negative.   Neurological: Negative.   Endo/Heme/Allergies: Negative.   Psychiatric/Behavioral: Negative.        Objective:     BP 120/66 (BP Location: Left Arm)   Pulse 63   Wt 181 lb (82.1 kg)   SpO2 98%   BMI 22.62 kg/m  BP Readings from Last 3 Encounters:  10/11/22 120/66  01/25/22 114/71  08/25/21 133/76   Wt Readings from Last 3 Encounters:  10/11/22 181 lb (82.1 kg)  01/25/22  181 lb 12.8 oz (82.5 kg)  08/25/21 181 lb 6.4 oz (82.3 kg)      Physical Exam Vitals reviewed.  Constitutional:      Appearance: Normal appearance.  HENT:     Head: Normocephalic and atraumatic.     Right Ear: External ear normal.     Left Ear: External ear normal.     Nose: Nose normal.     Mouth/Throat:     Mouth: Mucous membranes are moist.     Pharynx: Oropharynx is clear.  Eyes:     Conjunctiva/sclera: Conjunctivae normal.     Pupils: Pupils are equal, round, and reactive to light.  Cardiovascular:     Rate and Rhythm: Normal rate and regular rhythm.     Pulses: Normal pulses.     Heart sounds: Normal heart sounds.  Pulmonary:     Effort: Pulmonary effort is normal.     Breath sounds: Normal breath sounds.  Musculoskeletal:     Cervical back: Normal range of motion and neck  supple.  Skin:    Findings: Erythema present. Rash is not pustular.          Comments: See photo  Neurological:     General: No focal deficit present.     Mental Status: He is alert and oriented to person, place, and time.  Psychiatric:        Mood and Affect: Mood normal.        Behavior: Behavior normal.        Thought Content: Thought content normal.        Judgment: Judgment normal.         Assessment & Plan:   Problem List Items Addressed This Visit       Cardiovascular and Mediastinum   PAF (paroxysmal atrial fibrillation) (HCC)   Relevant Medications   apixaban (ELIQUIS) 5 MG TABS tablet   atorvastatin (LIPITOR) 10 MG tablet   metoprolol tartrate (LOPRESSOR) 25 MG tablet     Endocrine   Type 2 diabetes mellitus, controlled (HCC)   Relevant Medications   atorvastatin (LIPITOR) 10 MG tablet   metFORMIN (GLUCOPHAGE) 1000 MG tablet   Other Relevant Orders   CBC with Differential/Platelet   Comp. Metabolic Panel (12)   Microalbumin / creatinine urine ratio   Hemoglobin A1c   Hyperlipidemia associated with type 2 diabetes mellitus (HCC)   Relevant Medications   apixaban (ELIQUIS) 5 MG TABS tablet   atorvastatin (LIPITOR) 10 MG tablet   metFORMIN (GLUCOPHAGE) 1000 MG tablet   metoprolol tartrate (LOPRESSOR) 25 MG tablet   Other Relevant Orders   Lipid panel   Other Visit Diagnoses     Furuncle of neck    -  Primary   Relevant Medications   cephALEXin (KEFLEX) 500 MG capsule      1. Furuncle of neck Trial Keflex.  Patient education given on supportive care.  Red flags given for prompt reevaluation. - cephALEXin (KEFLEX) 500 MG capsule; Take 1 capsule (500 mg total) by mouth 4 (four) times daily for 7 days.  Dispense: 28 capsule; Refill: 0  2. Hyperlipidemia associated with type 2 diabetes mellitus ([place]) Continue current regimen.  Patient has upcoming appointment with primary care provider at the beginning of the year.  [facility] - atorvastatin (LIPITOR) 10 MG tablet; Take 1 tablet (10 mg total) by mouth daily.  Dispense: 90 tablet; Refill: 0 - Lipid panel; Future  3.  Controlled type 2 diabetes mellitus with hyperglycemia, with long-term current use of insulin (HCC) Continue current regimen, patient encouraged to continue checking blood glucose levels on a daily basis, keep a written log and have available for all office visits. - metFORMIN (GLUCOPHAGE) 1000 MG tablet; Take 1 tablet (1,000 mg total) by mouth 2 (two) times daily with a meal.  Dispense: 180 tablet; Refill: 0 - CBC with Differential/Platelet; Future - Comp. Metabolic Panel (12); Future - Microalbumin / creatinine urine ratio; Future - Hemoglobin A1c; Future  4. PAF (paroxysmal atrial fibrillation) (HCC) Continue current regimen - apixaban (ELIQUIS) 5 MG TABS tablet; Take 1 tablet (5 mg total) by mouth 2 (two) times daily.  Dispense: 180 tablet; Refill: 0 - metoprolol tartrate (LOPRESSOR) 25 MG tablet; Take 0.5 tablets (12.5 mg total) by mouth daily.  Dispense: 45 tablet; Refill: 0    I have reviewed the patient's medical history (PMH, PSH, Social History, Family History, Medications, and allergies) , and have been updated if relevant. I spent 30 minutes reviewing chart and  face to face time with patient.   Return in about 1 day (around 10/12/2022) for Fasting  labs, At [facility].    Loraine Grip Mayers, PA-C

## 2022-10-11 NOTE — Patient Instructions (Signed)
You are going to take Keflex 4 times a day for 7 days.    We will call you with your lab results.  Kennieth Rad, PA-C Physician Assistant [facility] [email or web address]   Skin Abscess  A skin abscess is an infected area on or under your skin that contains a collection of pus and other material. An abscess may also be called a furuncle, carbuncle, or boil. An abscess can occur in or on almost any part of your body. Some abscesses break open (rupture) on their own. Most continue to get worse unless they are treated. The infection can spread deeper into the body and eventually into your blood, which can make you feel ill. Treatment usually involves draining the abscess. What are the causes? An abscess occurs when germs, like bacteria, pass through your skin and cause an infection. This may be caused by: A scrape or cut on your skin. A puncture wound through your skin, including a needle injection or insect bite. Blocked oil or sweat glands. Blocked and infected hair follicles. A cyst that forms beneath your skin (sebaceous cyst) and becomes infected. What increases the risk? This condition is more likely to develop in people who: Have a weak body defense system (immune system). Have diabetes. Have dry and irritated skin. Get frequent injections or use illegal IV drugs. Have a foreign body in a wound, such as a splinter. Have problems with their lymph system or veins. What are the signs or symptoms? Symptoms of this condition include: A painful, firm bump under the skin. A bump with pus at the top. This may break through the skin and drain. Other symptoms include: Redness surrounding the abscess site. Warmth. Swelling of the lymph nodes (glands) near the abscess. Tenderness. A sore on the skin. How is this diagnosed? This condition may be diagnosed based on: A physical exam. Your medical history. A sample of pus. This  may be used to find out what is causing the infection. Blood tests. Imaging tests, such as an ultrasound, CT scan, or MRI. How is this treated? A small abscess that drains on its own may not need treatment. Treatment for larger abscesses may include: Moist heat or heat pack applied to the area several times a day. A procedure to drain the abscess (incision and drainage). Antibiotic medicines. For a severe abscess, you may first get antibiotics through an IV and then change to antibiotics by mouth. Follow these instructions at home: Medicines  Take over-the-counter and prescription medicines only as told by your health care provider. If you were prescribed an antibiotic medicine, take it as told by your health care provider. Do not stop taking the antibiotic even if you start to feel better. Abscess care  If you have an abscess that has not drained, apply heat to the affected area. Use the heat source that your health care provider recommends, such as a moist heat pack or a heating pad. Place a towel between your skin and the heat source. Leave the heat on for 20-30 minutes. Remove the heat if your skin turns bright red. This is especially important if you are unable to feel pain, heat, or cold. You may have a greater risk of getting burned. Follow instructions from your health care provider about how to take care of your abscess. Make sure you: Cover the abscess with a bandage (dressing). Change your dressing or gauze as told by your health care provider. Wash your hands with soap and water before you  change the dressing or gauze. If soap and water are not available, use hand sanitizer. Check your abscess every day for signs of a worsening infection. Check for: More redness, swelling, or pain. More fluid or blood. Warmth. More pus or a bad smell. General instructions To avoid spreading the infection: Do not share personal care items, towels, or hot tubs with others. Avoid making skin  contact with other people. Keep all follow-up visits as told by your health care provider. This is important. Contact a health care provider if you have: More redness, swelling, or pain around your abscess. More fluid or blood coming from your abscess. Warm skin around your abscess. More pus or a bad smell coming from your abscess. Muscle aches. Chills or a general ill feeling. Get help right away if you: Have severe pain. See red streaks on your skin spreading away from the abscess. See redness that spreads quickly. Have a fever or chills. Summary A skin abscess is an infected area on or under your skin that contains a collection of pus and other material. A small abscess that drains on its own may not need treatment. Treatment for larger abscesses may include having a procedure to drain the abscess and taking an antibiotic. This information is not intended to replace advice given to you by your health care provider. Make sure you discuss any questions you have with your health care provider. Document Revised: 02/18/2022 Document Reviewed: 08/24/2021 Elsevier Patient Education  [place].

## 2022-10-11 NOTE — Telephone Encounter (Signed)
  Chief Complaint: boil back of neck Symptoms: redness, spreading redness, purulent drainage, swelling Frequency: last week Pertinent Negatives: Patient denies fever, rash  Disposition: '[]'$ ED /'[]'$ Urgent Care (no appt availability in office) / '[]'$ Appointment(In office/virtual)/ '[]'$  [place] [facility]/ '[]'$ Home Care/ '[]'$ Refused Recommended Disposition /'[x]'$ [place] [facility]/ '[]'$  Follow-up with PCP Additional Notes: no appt's today or in next 24 hours at practice. Address given of where mobile bus is today Reason for Disposition  [1] Boil > 1/2 inch across (> 12 mm; larger than a marble) AND [2] center is soft or pus colored  Answer Assessment - Initial Assessment Questions 1. APPEARANCE of BOIL: "What does the boil look like?"      Redness, inflamed, hole in top  2. LOCATION: "Where is the boil located?"      Back of neck  3. NUMBER: "How many boils are there?"     1 4. SIZE: "How big is the boil?" (e.g., inches, cm; compare to size of a coin or other object)     Bigger than quartered-  5. ONSET: "When did the boil start?"     Last Friday 6. PAIN: "Is there any pain?" If Yes, ask: "How bad is the pain?"   (Scale 1-10; or mild, moderate, severe)     no 7. FEVER: "Do you have a fever?" If Yes, ask: "What is it, how was it measured, and when did it start?"      no 8. SOURCE: "Have you been around anyone with boils or other Staph infections?" "Have you ever had boils before?"     No- no 9. OTHER SYMPTOMS: "Do you have any other symptoms?" (e.g., shaking chills, weakness, rash elsewhere on body)     Pus drainage after pt popped it 10. PREGNANCY: "Is there any chance you are pregnant?" "When was your last menstrual period?"       N/a  Protocols used: Boil (Skin Abscess)-A-AH

## 2022-10-12 ENCOUNTER — Ambulatory Visit: Payer: Medicare HMO | Attending: Critical Care Medicine

## 2022-10-12 DIAGNOSIS — E1165 Type 2 diabetes mellitus with hyperglycemia: Secondary | ICD-10-CM

## 2022-10-12 DIAGNOSIS — E1169 Type 2 diabetes mellitus with other specified complication: Secondary | ICD-10-CM

## 2022-10-13 LAB — COMP. METABOLIC PANEL (12)
AST: 10 IU/L (ref 0–40)
Albumin/Globulin Ratio: 2.5 — ABNORMAL HIGH (ref 1.2–2.2)
Albumin: 4.5 g/dL (ref 3.9–4.9)
Alkaline Phosphatase: 78 IU/L (ref 44–121)
BUN/Creatinine Ratio: 19 (ref 10–24)
BUN: 14 mg/dL (ref 8–27)
Bilirubin Total: 0.5 mg/dL (ref 0.0–1.2)
Calcium: 9.5 mg/dL (ref 8.6–10.2)
Chloride: 102 mmol/L (ref 96–106)
Creatinine, Ser: 0.74 mg/dL — ABNORMAL LOW (ref 0.76–1.27)
Globulin, Total: 1.8 g/dL (ref 1.5–4.5)
Glucose: 115 mg/dL — ABNORMAL HIGH (ref 70–99)
Potassium: 4.7 mmol/L (ref 3.5–5.2)
Sodium: 140 mmol/L (ref 134–144)
Total Protein: 6.3 g/dL (ref 6.0–8.5)
eGFR: 97 mL/min/{1.73_m2} (ref >59)

## 2022-10-13 LAB — CBC WITH DIFFERENTIAL/PLATELET
Basophils Absolute: 0 10*3/uL (ref 0.0–0.2)
Basos: 1 %
EOS (ABSOLUTE): 0.1 10*3/uL (ref 0.0–0.4)
Eos: 2 %
Hematocrit: 40.9 % (ref 37.5–51.0)
Hemoglobin: 14 g/dL (ref 13.0–17.7)
Immature Grans (Abs): 0 10*3/uL (ref 0.0–0.1)
Immature Granulocytes: 0 %
Lymphocytes Absolute: 2 10*3/uL (ref 0.7–3.1)
Lymphs: 27 %
MCH: 31.5 pg (ref 26.6–33.0)
MCHC: 34.2 g/dL (ref 31.5–35.7)
MCV: 92 fL (ref 79–97)
Monocytes Absolute: 0.6 10*3/uL (ref 0.1–0.9)
Monocytes: 8 %
Neutrophils Absolute: 4.7 10*3/uL (ref 1.4–7.0)
Neutrophils: 62 %
Platelets: 230 10*3/uL (ref 150–450)
RBC: 4.44 x10E6/uL (ref 4.14–5.80)
RDW: 11.8 % (ref 11.6–15.4)
WBC: 7.4 10*3/uL (ref 3.4–10.8)

## 2022-10-13 LAB — LIPID PANEL
Chol/HDL Ratio: 3.4 ratio (ref 0.0–5.0)
Cholesterol, Total: 121 mg/dL (ref 100–199)
HDL: 36 mg/dL — ABNORMAL LOW (ref >39)
LDL Chol Calc (NIH): 66 mg/dL (ref 0–99)
Triglycerides: 103 mg/dL (ref 0–149)
VLDL Cholesterol Cal: 19 mg/dL (ref 5–40)

## 2022-10-13 LAB — HEMOGLOBIN A1C
Est. average glucose Bld gHb Est-mCnc: 131 mg/dL
Hgb A1c MFr Bld: 6.2 % — ABNORMAL HIGH (ref 4.8–5.6)

## 2022-10-15 LAB — MICROALBUMIN / CREATININE URINE RATIO
Creatinine, Urine: 28.4 mg/dL
Microalb/Creat Ratio: <11 mg/g creat (ref 0–29)
Microalbumin, Urine: <3 ug/mL

## 2022-10-20 ENCOUNTER — Telehealth: Payer: Self-pay | Admitting: Critical Care Medicine

## 2022-10-20 DIAGNOSIS — E1165 Type 2 diabetes mellitus with hyperglycemia: Secondary | ICD-10-CM

## 2022-10-20 MED ORDER — BD PEN NEEDLE NANO 2ND GEN 32G X 4 MM MISC: 11 refills | Status: DC

## 2022-10-20 NOTE — Telephone Encounter (Signed)
Per patient supplies for blood sugars need to be sent [place]. He wasn't very sure which supplies he wanted.   He will call office back next week.

## 2022-10-20 NOTE — Telephone Encounter (Signed)
Patient called in says received letter that [employer] isnt' going to be able to get his supplies anymore, [facility] would have to [facility]. He asked would this still be sent to him or he has to go get it. Please call back

## 2022-11-08 ENCOUNTER — Telehealth: Payer: Self-pay | Admitting: Critical Care Medicine

## 2022-11-08 NOTE — Telephone Encounter (Signed)
Patient wife came into the office on behave of her husband he  wondering if he could receive a different machine called one touch verio reflect because the one he has his insurance will not cover it.

## 2022-11-15 ENCOUNTER — Telehealth: Payer: Self-pay | Admitting: Critical Care Medicine

## 2022-11-15 NOTE — Telephone Encounter (Signed)
Pt was using [facility] for his diabetic supplies. (He was getting 200 lancets and 200 test strips from them)  pt's insurance is no longer going to do business w/ them.   Pt is now going to have to use [facility].  [facility] needs a new Rx for a new meter, lancets  and test strips.  (Pt states a devise that holds the lancets. (Pt had 4 devices he could choose from) Pt needs a new Rx for the  verio one touch reflect.   Please send new Rx to [facility] for the above meter and supplies. Pt test 2 times a day.  [facility] [place] [place] [facility] [employer] [facility]

## 2022-11-16 MED ORDER — ONETOUCH ULTRASOFT LANCETS MISC: 12 refills | Status: DC

## 2022-11-16 MED ORDER — GLUCOSE BLOOD VI STRP: ORAL_STRIP | 12 refills | Status: DC

## 2022-11-16 MED ORDER — ONETOUCH VERIO REFLECT W/DEVICE KIT: PACK | 0 refills | Status: DC

## 2022-11-16 NOTE — Telephone Encounter (Signed)
Rx sent 

## 2022-11-16 NOTE — Telephone Encounter (Signed)
Called and patient is aware of rx

## 2022-11-17 ENCOUNTER — Ambulatory Visit: Payer: Self-pay | Admitting: *Deleted

## 2022-11-17 ENCOUNTER — Other Ambulatory Visit: Payer: Self-pay | Admitting: Pharmacist

## 2022-11-17 DIAGNOSIS — E1165 Type 2 diabetes mellitus with hyperglycemia: Secondary | ICD-10-CM

## 2022-11-17 MED ORDER — TRESIBA FLEXTOUCH 100 UNIT/ML ~~LOC~~ SOPN: 10.0000 [IU] | PEN_INJECTOR | Freq: Every day | SUBCUTANEOUS | 3 refills | Status: DC

## 2022-11-17 NOTE — Telephone Encounter (Signed)
Reason for Disposition  [1] Caller has URGENT medicine question about med that PCP or specialist prescribed AND [2] triager unable to answer question  Answer Assessment - Initial Assessment Questions 1. NAME of MEDICINE: "What medicine(s) are you calling about?"     Tresiba Flexpen 2. QUESTION: "What is your question?" (e.g., double dose of medicine, side effect)     [facility] [address]   The pharmacy tells me they have requested from [facility]) but haven't heard anything.   I need my insulin.    Looking in chart it has not been requested.   Last request was 01/25/2022 15 ml, 3 refills.   3. PRESCRIBER: "Who prescribed the medicine?" Reason: if prescribed by specialist, call should be referred to that group.     Dr. Asencion Noble 4. SYMPTOMS: "Do you have any symptoms?" If Yes, ask: "What symptoms are you having?"  "How bad are the symptoms (e.g., mild, moderate, severe)     N/A 5. PREGNANCY:  "Is there any chance that you are pregnant?" "When was your last menstrual period?"     N/A  Protocols used: Medication Question [place]  Chief Complaint: Pt upset.  He is at the pharmacy and his Tyler Aas is not there.   Pharmacy says it's been requested but looking in chart last request was 01/25/2022 15 ml, 3 refills.   He wants to know "Who dropped the ball?"   Pharmacy says they have requested it but heard nothing back.     Symptoms: N/A Disposition: '[]'$ ED /'[]'$ Urgent Care (no appt availability in office) / '[]'$ Appointment(In office/virtual)/ '[]'$  [place] [facility]/ '[]'$ Home Care/ '[]'$ Refused Recommended Disposition /'[]'$ [place] [facility]/ '[x]'$  Follow-up with PCP Additional Notes: Message sent to Dr. Asencion Noble at [place] for a refill on the Tresiba.   [facility] [address] in [place].    Pt. Was agreeable to this however he was still upset that the [place] wasn't at the pharmacy and no one knows what is going on.

## 2022-11-18 ENCOUNTER — Other Ambulatory Visit: Payer: Self-pay | Admitting: Critical Care Medicine

## 2022-11-18 DIAGNOSIS — E1165 Type 2 diabetes mellitus with hyperglycemia: Secondary | ICD-10-CM

## 2022-11-19 MED ORDER — ONETOUCH VERIO FLEX SYSTEM DEVI: 1.0000 | Freq: Two times a day (BID) | 0 refills | Status: DC

## 2022-11-27 NOTE — Progress Notes (Signed)
Established Patient Office Visit  Subjective:  Patient ID: Cameron Arroyo, male    DOB: 12-17-1950  Age: 71 y.o. MRN: [id number]  CC:  Chief Complaint  Patient presents with   Diabetes    HPI 01/25/22 Cameron Arroyo presents for diabetes follow-up.  The patient brings with him a diary blood pressures are in the 100-110/70-80 range at home blood sugars fasting in the 100 range he never has a blood sugar less than 70 and postprandials in the 140 range.  Blood sugar on arrival 101 and blood pressure 114/71.  Patient tends to get up at 7:30 AM and will have a light breakfast at 9:30 AM which includes oatmeal he will have a light snack at 10:30 AM then he will have lunch at noon the wife says occasionally is just popcorn he sometimes skips dinner sometimes he will have a midafternoon dinner which will include ham butter beans and sweet potatoes he does not eat much in the way of salads and does not exercise.  Wife said he sits and watches TV all day and is very hard of hearing.  He has had no tachycardia or issues with his blood pressure or his heart.  He is having no bleeding on Eliquis.  Today the patient needs an A1c and a urine microalbumin he does not need any other labs at this visit.  The patient did declined to receive the pneumococcal vaccine.  He also does not wish to receive the shingles vaccine.  A1C 6.2 this visit   11/30/22  Patient seen in return follow-up has not been seen since February of last year but did go [facility] in November for pustular lesion on the back of the neck.  This resolved with antibiotics alone and did self drain. The patient has not had recent tachycardia.  Blood pressure today is 121/73 pulse is 65.  Patient now has new insurance and wants his glucometer from [facility] we tried to send a prescription in December but apparently did not go through we will need to have our pharmacist follow-up on this.  He needs refills on his medications and [facility]  His wife is with him and she states he only eats 1 meal a day will not eat after 3 PM.  She states he can be very cross with her and he stays at home all the time sits in his chair watches TV all day.  Patient brings his recordings with him of his data and his blood pressure has been anywhere from 110s to 120/73 continuously blood sugars have been excellent and A1c today is 6.2 which is unchanged from February 2023. Patient has poor dentition but declines to see a dentist at this time.  Patient reminded he needs an eye exam this calendar year.  He also needs follow-up with cardiology.  Past Medical History:  Diagnosis Date   Arthritis    Asthma    as child   Atrial flutter with rapid ventricular response (HCC) 08/20/2019   Lumbar disc herniation 11/01/2019   New onset type 2 diabetes mellitus (HCC) 08/20/2019   Pneumonia    history of    Past Surgical History:  Procedure Laterality Date   ANTERIOR CERVICAL DECOMP/DISCECTOMY FUSION N/A 03/01/2013   Procedure: Cervical Four-Five Cervical Five-Six Cervical Six-Seven anterior cervical decompression with fusion plating and bonegraft;  Surgeon: Hewitt Shorts, MD;  Location: [facility];  Service: Neurosurgery;  Laterality: N/A;  ANTERIOR CERVICAL DECOMPRESSION/DISCECTOMY FUSION 3 LEVELS   CARDIOVERSION N/A 08/22/2019   Procedure: CARDIOVERSION;  Surgeon: Little Ishikawa, MD;  Location: [facility] ENDOSCOPY;  Service: Endoscopy;  Laterality: N/A;   TEE WITHOUT CARDIOVERSION N/A 08/22/2019   Procedure: TRANSESOPHAGEAL ECHOCARDIOGRAM (TEE);  Surgeon: Little Ishikawa, MD;  Location: [facility] ENDOSCOPY;  Service: Endoscopy;  Laterality: N/A;    Family History  Problem Relation Age of Onset   Cirrhosis Mother    Cancer Father     Social History   Socioeconomic History   Marital status: Married    Spouse name: Not on file   Number of children: Not on file   Years of education: Not on file   Highest education  level: Not on file  Occupational History   Not on file  Tobacco Use   Smoking status: Former    Types: Cigarettes    Quit date: 11/29/1992    Years since quitting: 30.0   Smokeless tobacco: Never  Vaping Use   Vaping Use: Never used  Substance and Sexual Activity   Alcohol use: No   Drug use: Never   Sexual activity: Not Currently  Other Topics Concern   Not on file  Social History Narrative   Not on file   Social Determinants of Health   Financial Resource Strain: Not on file  Food Insecurity: Not on file  Transportation Needs: Not on file  Physical Activity: Not on file  Stress: Not on file  Social Connections: Not on file  Intimate Partner Violence: Not on file    Outpatient Medications Prior to Visit  Medication Sig Dispense Refill   BD PEN NEEDLE NANO 2ND GEN 32G X 4 MM MISC Use with Cameron Arroyo Flextouch 100 each 11   apixaban (ELIQUIS) 5 MG TABS tablet Take 1 tablet (5 mg total) by mouth 2 (two) times daily. 180 tablet 0   atorvastatin (LIPITOR) 10 MG tablet Take 1 tablet (10 mg total) by mouth daily. 90 tablet 0   Blood Glucose Monitoring Suppl (ONETOUCH VERIO FLEX SYSTEM) DEVI 1 each by Does not apply route 2 (two) times daily. Use to check blood sugar twice daily. E11.65 1 each 0   Blood Glucose Monitoring Suppl (ONETOUCH VERIO REFLECT) w/Device KIT Use to check blood sugar twice daily 1 kit 0   glucose blood test strip Use as instructed to check blood sugar twice daily 200 each 12   insulin degludec (TRESIBA FLEXTOUCH) 100 UNIT/ML FlexTouch Pen Inject 10 Units into the skin daily. 15 mL 3   Lancets (ONETOUCH ULTRASOFT) lancets Use as instructed twice daily 200 each 12   metFORMIN (GLUCOPHAGE) 1000 MG tablet Take 1 tablet (1,000 mg total) by mouth 2 (two) times daily with a meal. 180 tablet 0   metoprolol tartrate (LOPRESSOR) 25 MG tablet Take 0.5 tablets (12.5 mg total) by mouth daily. 45 tablet 0   No facility-administered medications prior to visit.    No Known  Allergies  ROS Review of Systems  Constitutional: Negative.   HENT:  Positive for dental problem and hearing loss. Negative for ear pain, postnasal drip, rhinorrhea, sinus pressure, sore throat, trouble swallowing and voice change.   Eyes: Negative.   Respiratory: Negative.  Negative for apnea, cough, choking, chest tightness, shortness of breath, wheezing and stridor.   Cardiovascular: Negative.  Negative for chest pain, palpitations and leg swelling.  Gastrointestinal: Negative.  Negative for abdominal distention, abdominal pain, nausea and vomiting.  Genitourinary: Negative.   Musculoskeletal: Negative.  Negative for arthralgias  and myalgias.  Skin: Negative.  Negative for rash.  Allergic/Immunologic: Negative.  Negative for environmental allergies and food allergies.  Neurological: Negative.  Negative for dizziness, syncope, weakness and headaches.  Hematological: Negative.  Negative for adenopathy. Does not bruise/bleed easily.  Psychiatric/Behavioral: Negative.  Negative for agitation and sleep disturbance. The patient is not nervous/anxious.       Objective:    Physical Exam Vitals reviewed.  Constitutional:      Appearance: Normal appearance. He is well-developed. He is not diaphoretic.  HENT:     Head: Normocephalic and atraumatic.     Right Ear: Tympanic membrane, ear canal and external ear normal. There is no impacted cerumen.     Left Ear: Tympanic membrane, ear canal and external ear normal. There is no impacted cerumen.     Nose: Nose normal. No nasal deformity, septal deviation, mucosal edema or rhinorrhea.     Right Sinus: No maxillary sinus tenderness or frontal sinus tenderness.     Left Sinus: No maxillary sinus tenderness or frontal sinus tenderness.     Mouth/Throat:     Mouth: Mucous membranes are moist.     Pharynx: Oropharynx is clear. No oropharyngeal exudate.     Comments: Extremely poor dentition multiple carious teeth and periodontal disease Eyes:      General: No scleral icterus.    Conjunctiva/sclera: Conjunctivae normal.     Pupils: Pupils are equal, round, and reactive to light.  Neck:     Thyroid: No thyromegaly.     Vascular: No carotid bruit or JVD.     Trachea: Trachea normal. No tracheal tenderness or tracheal deviation.  Cardiovascular:     Rate and Rhythm: Normal rate and regular rhythm.     Chest Wall: PMI is not displaced.     Pulses: Normal pulses. No decreased pulses.     Heart sounds: Normal heart sounds, S1 normal and S2 normal. Heart sounds not distant. No murmur heard.    No systolic murmur is present.     No diastolic murmur is present.     No friction rub. No gallop. No S3 or S4 sounds.  Pulmonary:     Effort: No tachypnea, accessory muscle usage or respiratory distress.     Breath sounds: No stridor. No decreased breath sounds, wheezing, rhonchi or rales.  Chest:     Chest wall: No tenderness.  Abdominal:     General: Bowel sounds are normal. There is no distension.     Palpations: Abdomen is soft. Abdomen is not rigid.     Tenderness: There is no abdominal tenderness. There is no guarding or rebound.  Musculoskeletal:        General: Normal range of motion.     Cervical back: Normal range of motion and neck supple. No edema, erythema or rigidity. No muscular tenderness. Normal range of motion.     Comments: Normal foot exam  Lymphadenopathy:     Head:     Right side of head: No submental or submandibular adenopathy.     Left side of head: No submental or submandibular adenopathy.     Cervical: No cervical adenopathy.  Skin:    General: Skin is warm and dry.     Coloration: Skin is not pale.     Findings: No rash.     Nails: There is no clubbing.     Comments: Posterior neck area of prior skin abscess has resolved there is a very small sebaceous cyst left in that area will  be watched  Neurological:     General: No focal deficit present.     Mental Status: He is alert and oriented to person, place, and  time. Mental status is at baseline.     Sensory: No sensory deficit.  Psychiatric:        Speech: Speech normal.        Behavior: Behavior normal.     BP 121/73 Comment: Home reading recorded at 7:30 AM today  Pulse (!) 56   Wt 190 lb (86.2 kg)   SpO2 99%   BMI 23.75 kg/m  Wt Readings from Last 3 Encounters:  11/30/22 190 lb (86.2 kg)  10/11/22 181 lb (82.1 kg)  01/25/22 181 lb 12.8 oz (82.5 kg)     Health Maintenance Due  Topic Date Due   OPHTHALMOLOGY EXAM  02/19/2021    There are no preventive care reminders to display for this patient.  Lab Results  Component Value Date   TSH 1.194 08/20/2019   Lab Results  Component Value Date   WBC 7.4 10/12/2022   HGB 14.0 10/12/2022   HCT 40.9 10/12/2022   MCV 92 10/12/2022   PLT 230 10/12/2022   Lab Results  Component Value Date   NA 140 10/12/2022   K 4.7 10/12/2022   CO2 24 08/25/2021   GLUCOSE 115 (H) 10/12/2022   BUN 14 10/12/2022   CREATININE 0.74 (L) 10/12/2022   BILITOT 0.5 10/12/2022   ALKPHOS 78 10/12/2022   AST 10 10/12/2022   ALT 10 08/25/2021   PROT 6.3 10/12/2022   ALBUMIN 4.5 10/12/2022   CALCIUM 9.5 10/12/2022   ANIONGAP 13 10/30/2019   EGFR 97 10/12/2022   Lab Results  Component Value Date   CHOL 121 10/12/2022   Lab Results  Component Value Date   HDL 36 (L) 10/12/2022   Lab Results  Component Value Date   LDLCALC 66 10/12/2022   Lab Results  Component Value Date   TRIG 103 10/12/2022   Lab Results  Component Value Date   CHOLHDL 3.4 10/12/2022   Lab Results  Component Value Date   HGBA1C 6.2 (H) 10/12/2022      Assessment & Plan:   Problem List Items Addressed This Visit       Cardiovascular and Mediastinum   PAF (paroxysmal atrial fibrillation) (HCC)    Currently in sinus rhythm we will continue metoprolol as prescribed and continue Eliquis for now  Patient needs to be seen again by cardiology referral be made      Relevant Medications   apixaban (ELIQUIS) 5  MG TABS tablet   atorvastatin (LIPITOR) 10 MG tablet   metoprolol tartrate (LOPRESSOR) 25 MG tablet   Other Relevant Orders   Ambulatory referral to Cardiology     Digestive   Periodontal disease    Patient declines to see dentistry at this time he is encouraged to reconsider        Endocrine   Type 2 diabetes mellitus, controlled (HCC) - Primary    Type 2 diabetes at goal hemoglobin A1c at 6.2 continue current medication program refills given Reminded to get an eye exam this calendar year  Patient encouraged to eat 3 meals daily and not just 1 large meal daily and to try to get up and move around a bit more  Patient running out of glucose testing supplies his insurance prefers One Touch Verio reflect system he has had difficulty obtaining this  My clinical pharmacist call the 1866 number and after  30 minutes on hold they told him that the patient needs to call for consent.  Patient states he has tried to call and they would either not answer the phone or told him that we need to call so I am confused      Relevant Medications   atorvastatin (LIPITOR) 10 MG tablet   insulin degludec (TRESIBA FLEXTOUCH) 100 UNIT/ML FlexTouch Pen   metFORMIN (GLUCOPHAGE) 1000 MG tablet   Other Relevant Orders   POCT glucose (manual entry) (Completed)   Hyperlipidemia associated with type 2 diabetes mellitus (HCC)    Cholesterol at goal at the last visit will continue current medication refills given      Relevant Medications   apixaban (ELIQUIS) 5 MG TABS tablet   atorvastatin (LIPITOR) 10 MG tablet   insulin degludec (TRESIBA FLEXTOUCH) 100 UNIT/ML FlexTouch Pen   metFORMIN (GLUCOPHAGE) 1000 MG tablet   metoprolol tartrate (LOPRESSOR) 25 MG tablet     Musculoskeletal and Integument   Sebaceous cyst    Posterior neck previously infected now resolved we will observe does not need to be removed        Other   Reactive depression    Remains depressed and having conflict with spouse I  offered counseling he declined      Chronic anticoagulation    No evident bleeding continue Eliquis      Decreased hearing of both ears    Declined audiology referral      Meds ordered this encounter  Medications   DISCONTD: glucose blood test strip    Sig: Use as instructed to check blood sugar twice daily    Dispense:  200 each    Refill:  12    One touch verio  reflect system   DISCONTD: Lancets (ONETOUCH ULTRASOFT) lancets    Sig: Use as instructed twice daily    Dispense:  200 each    Refill:  12    For one touch verio reflect system   apixaban (ELIQUIS) 5 MG TABS tablet    Sig: Take 1 tablet (5 mg total) by mouth 2 (two) times daily.    Dispense:  180 tablet    Refill:  1   atorvastatin (LIPITOR) 10 MG tablet    Sig: Take 1 tablet (10 mg total) by mouth daily.    Dispense:  90 tablet    Refill:  1   insulin degludec (TRESIBA FLEXTOUCH) 100 UNIT/ML FlexTouch Pen    Sig: Inject 10 Units into the skin daily.    Dispense:  15 mL    Refill:  3   metFORMIN (GLUCOPHAGE) 1000 MG tablet    Sig: Take 1 tablet (1,000 mg total) by mouth 2 (two) times daily with a meal.    Dispense:  180 tablet    Refill:  1   metoprolol tartrate (LOPRESSOR) 25 MG tablet    Sig: Take 0.5 tablets (12.5 mg total) by mouth daily.    Dispense:  45 tablet    Refill:  1   DISCONTD: Blood Glucose Monitoring Suppl (ONETOUCH VERIO REFLECT) w/Device KIT    Sig: Use to check blood sugar twice daily    Dispense:  1 kit    Refill:  0    Follow-up: Return in about 6 months (around 05/31/2023) for diabetes, htn.    Shan Levans, MD

## 2022-11-30 ENCOUNTER — Other Ambulatory Visit: Payer: Self-pay

## 2022-11-30 ENCOUNTER — Other Ambulatory Visit: Payer: Self-pay | Admitting: Pharmacist

## 2022-11-30 ENCOUNTER — Ambulatory Visit: Payer: Medicare HMO | Attending: Critical Care Medicine | Admitting: Critical Care Medicine

## 2022-11-30 ENCOUNTER — Encounter: Payer: Self-pay | Admitting: Critical Care Medicine

## 2022-11-30 VITALS — BP 121/73 | HR 56 | Wt 190.0 lb

## 2022-11-30 DIAGNOSIS — E1165 Type 2 diabetes mellitus with hyperglycemia: Secondary | ICD-10-CM

## 2022-11-30 DIAGNOSIS — E785 Hyperlipidemia, unspecified: Secondary | ICD-10-CM | POA: Diagnosis not present

## 2022-11-30 DIAGNOSIS — F329 Major depressive disorder, single episode, unspecified: Secondary | ICD-10-CM | POA: Diagnosis not present

## 2022-11-30 DIAGNOSIS — L723 Sebaceous cyst: Secondary | ICD-10-CM

## 2022-11-30 DIAGNOSIS — Z794 Long term (current) use of insulin: Secondary | ICD-10-CM

## 2022-11-30 DIAGNOSIS — E1169 Type 2 diabetes mellitus with other specified complication: Secondary | ICD-10-CM

## 2022-11-30 DIAGNOSIS — Z7901 Long term (current) use of anticoagulants: Secondary | ICD-10-CM

## 2022-11-30 DIAGNOSIS — H9193 Unspecified hearing loss, bilateral: Secondary | ICD-10-CM

## 2022-11-30 DIAGNOSIS — K056 Periodontal disease, unspecified: Secondary | ICD-10-CM

## 2022-11-30 DIAGNOSIS — I480 Paroxysmal atrial fibrillation: Secondary | ICD-10-CM

## 2022-11-30 LAB — GLUCOSE, POCT (MANUAL RESULT ENTRY): POC Glucose: 92 mg/dL (ref 70–99)

## 2022-11-30 MED ORDER — ATORVASTATIN CALCIUM 10 MG PO TABS: 10.0000 mg | ORAL_TABLET | Freq: Every day | ORAL | 1 refills | Status: DC

## 2022-11-30 MED ORDER — TRESIBA FLEXTOUCH 100 UNIT/ML ~~LOC~~ SOPN: 10.0000 [IU] | PEN_INJECTOR | Freq: Every day | SUBCUTANEOUS | 3 refills | Status: DC

## 2022-11-30 MED ORDER — METOPROLOL TARTRATE 25 MG PO TABS: 12.5000 mg | ORAL_TABLET | Freq: Every day | ORAL | 1 refills | Status: DC

## 2022-11-30 MED ORDER — ONETOUCH DELICA LANCETS 33G MISC: 11 refills | Status: DC

## 2022-11-30 MED ORDER — ONETOUCH ULTRASOFT LANCETS MISC: 12 refills | Status: DC

## 2022-11-30 MED ORDER — ONETOUCH VERIO REFLECT W/DEVICE KIT: PACK | 0 refills | Status: DC

## 2022-11-30 MED ORDER — GLUCOSE BLOOD VI STRP: ORAL_STRIP | 12 refills | Status: DC

## 2022-11-30 MED ORDER — APIXABAN 5 MG PO TABS: 5.0000 mg | ORAL_TABLET | Freq: Two times a day (BID) | ORAL | 1 refills | Status: DC

## 2022-11-30 MED ORDER — ONETOUCH VERIO REFLECT W/DEVICE KIT: PACK | 0 refills | Status: AC

## 2022-11-30 MED ORDER — ONETOUCH VERIO VI STRP: ORAL_STRIP | 11 refills | Status: DC

## 2022-11-30 MED ORDER — METFORMIN HCL 1000 MG PO TABS: 1000.0000 mg | ORAL_TABLET | Freq: Two times a day (BID) | ORAL | 1 refills | Status: DC

## 2022-11-30 NOTE — Assessment & Plan Note (Signed)
Currently in sinus rhythm we will continue metoprolol as prescribed and continue Eliquis for now  Patient needs to be seen again by cardiology referral be made

## 2022-11-30 NOTE — Assessment & Plan Note (Signed)
Remains depressed and having conflict with spouse I offered counseling he declined

## 2022-11-30 NOTE — Assessment & Plan Note (Signed)
Posterior neck previously infected now resolved we will observe does not need to be removed

## 2022-11-30 NOTE — Assessment & Plan Note (Signed)
Patient declines to see dentistry at this time he is encouraged to reconsider

## 2022-11-30 NOTE — Assessment & Plan Note (Addendum)
Type 2 diabetes at goal hemoglobin A1c at 6.2 continue current medication program refills given Reminded to get an eye exam this calendar year  Patient encouraged to eat 3 meals daily and not just 1 large meal daily and to try to get up and move around a bit more  Patient running out of glucose testing supplies his insurance prefers One Touch Verio reflect system he has had difficulty obtaining this  My clinical pharmacist call the 1866 number and after 30 minutes on hold they told him that the patient needs to call for consent.  Patient states he has tried to call and they would either not answer the phone or told him that we need to call so I am confused

## 2022-11-30 NOTE — Assessment & Plan Note (Signed)
Cholesterol at goal at the last visit will continue current medication refills given

## 2022-11-30 NOTE — Assessment & Plan Note (Signed)
No evident bleeding continue Eliquis

## 2022-11-30 NOTE — Assessment & Plan Note (Signed)
Declined audiology referral

## 2022-11-30 NOTE — Patient Instructions (Signed)
Blood pressure numbers and diabetes under good control  Consider eating at least small amounts of food for 2 meals of the day in addition to your large meal the day  Please get an eye exam this calendar year  Referral to cardiology be made to follow-up on your atrial fibrillation  Let us know if the back your neck becomes infected again  Your glucose meter and supplies was sent to [facility] I will make sure they received it  Your other [facility]  Return to Dr. Joya Gaskins 6 months

## 2022-12-01 ENCOUNTER — Other Ambulatory Visit: Payer: Self-pay | Admitting: Critical Care Medicine

## 2022-12-01 DIAGNOSIS — E1165 Type 2 diabetes mellitus with hyperglycemia: Secondary | ICD-10-CM

## 2022-12-01 MED ORDER — ONETOUCH VERIO W/DEVICE KIT: PACK | 0 refills | Status: AC

## 2023-01-13 ENCOUNTER — Telehealth: Payer: Self-pay | Admitting: Critical Care Medicine

## 2023-01-13 NOTE — Telephone Encounter (Signed)
Jeannie with [facility] called because they received two prescriptions for different test strips that were put on hold because it's too soon to fill. Edmonia Lynch says one of the diabetic test strips says to test once daily and the other one says to test twice daily. Edmonia Lynch would like to know which one is the correct dosage. Please follow up with Jeannie at [phone] option 2 Reference #: [id number]

## 2023-01-14 ENCOUNTER — Telehealth: Payer: Self-pay | Admitting: Critical Care Medicine

## 2023-01-14 NOTE — Telephone Encounter (Signed)
[place] with [facility] states that they are needing clarification for pt prescription glucose blood (ONETOUCH VERIO) test strip .  Almyra Free states that there was two prescriptions that was sent over and they are needing to know how many times a day the pt is having to test their blood sugar.  Please advise   Please call Almyra Free: [phone]   option 2 And please use reference number. Ref #  [id number]

## 2023-01-14 NOTE — Telephone Encounter (Signed)
Completed. Documentation made under another telephone encounter.

## 2023-01-14 NOTE — Telephone Encounter (Signed)
Call placed to [place]. Verified that patient is to be testing once daily. Even though he is on insulin, pt has made it clear to [place] that he prefers once daily testing. No further questions from the pharmacy at this time.

## 2023-01-27 ENCOUNTER — Encounter: Payer: Self-pay | Admitting: Gastroenterology

## 2023-02-14 ENCOUNTER — Encounter: Payer: Self-pay | Admitting: Physician Assistant

## 2023-02-14 ENCOUNTER — Ambulatory Visit: Payer: Medicare HMO | Admitting: Physician Assistant

## 2023-02-14 ENCOUNTER — Ambulatory Visit: Payer: Self-pay

## 2023-02-14 VITALS — BP 126/73 | HR 66 | Ht 75.0 in | Wt 185.0 lb

## 2023-02-14 DIAGNOSIS — R42 Dizziness and giddiness: Secondary | ICD-10-CM | POA: Diagnosis not present

## 2023-02-14 DIAGNOSIS — I480 Paroxysmal atrial fibrillation: Secondary | ICD-10-CM

## 2023-02-14 DIAGNOSIS — E1165 Type 2 diabetes mellitus with hyperglycemia: Secondary | ICD-10-CM | POA: Diagnosis not present

## 2023-02-14 DIAGNOSIS — Z794 Long term (current) use of insulin: Secondary | ICD-10-CM

## 2023-02-14 LAB — POCT GLYCOSYLATED HEMOGLOBIN (HGB A1C): Hemoglobin A1C: 6.5 % — AB (ref 4.0–5.6)

## 2023-02-14 NOTE — Patient Instructions (Signed)
I encourage you to increase your water intake, you should be drinking at least 64 ounces of water a day.  Continue monitoring your blood pressure and blood sugars along with your pulse rate.  If you are able, consider checking blood sugar, blood pressure and pulse rate during episodes of dizziness.  We will call you with today's lab results.  Kennieth Rad, PA-C Physician Assistant [facility] [email or web address]   Dizziness Dizziness is a common problem. It is a feeling of unsteadiness or light-headedness. You may feel like you are about to faint. Dizziness can lead to injury if you stumble or fall. Anyone can become dizzy, but dizziness is more common in older adults. This condition can be caused by a number of things, including medicines, dehydration, or illness. Follow these instructions at home: Eating and drinking  Drink enough fluid to keep your urine pale yellow. This helps to keep you from becoming dehydrated. Try to drink more clear fluids, such as water. Do not drink alcohol. Limit your caffeine intake if told to do so by your health care provider. Check ingredients and nutrition facts to see if a food or beverage contains caffeine. Limit your salt (sodium) intake if told to do so by your health care provider. Check ingredients and nutrition facts to see if a food or beverage contains sodium. Activity  Avoid making quick movements. Rise slowly from chairs and steady yourself until you feel okay. In the morning, first sit up on the side of the bed. When you feel okay, stand slowly while you hold onto something until you know that your balance is good. If you need to stand in one place for a long time, move your legs often. Tighten and relax the muscles in your legs while you are standing. Do not drive or use machinery if you feel dizzy. Avoid bending down if you feel dizzy. Place items in your home so that they are easy  for you to reach without leaning over. Lifestyle Do not use any products that contain nicotine or tobacco. These products include cigarettes, chewing tobacco, and vaping devices, such as e-cigarettes. If you need help quitting, ask your health care provider. Try to reduce your stress level by using methods such as yoga or meditation. Talk with your health care provider if you need help to manage your stress. General instructions Watch your dizziness for any changes. Take over-the-counter and prescription medicines only as told by your health care provider. Talk with your health care provider if you think that your dizziness is caused by a medicine that you are taking. Tell a friend or a family member that you are feeling dizzy. If he or she notices any changes in your behavior, have this person call your health care provider. Keep all follow-up visits. This is important. Contact a health care provider if: Your dizziness does not go away or you have new symptoms. Your dizziness or light-headedness gets worse. You feel nauseous. You have reduced hearing. You have a fever. You have neck pain or a stiff neck. Your dizziness leads to an injury or a fall. Get help right away if: You vomit or have diarrhea and are unable to eat or drink anything. You have problems talking, walking, swallowing, or using your arms, hands, or legs. You feel generally weak. You have any bleeding. You are not thinking clearly or you have trouble forming sentences. It may take a friend or family member to notice this. You have chest pain, abdominal pain,  shortness of breath, or sweating. Your vision changes or you develop a severe headache. These symptoms may represent a serious problem that is an emergency. Do not wait to see if the symptoms will go away. Get medical help right away. Call your local emergency services (911 in the [place] Do not drive yourself to the hospital. Summary Dizziness is a feeling of  unsteadiness or light-headedness. This condition can be caused by a number of things, including medicines, dehydration, or illness. Anyone can become dizzy, but dizziness is more common in older adults. Drink enough fluid to keep your urine pale yellow. Do not drink alcohol. Avoid making quick movements if you feel dizzy. Monitor your dizziness for any changes. This information is not intended to replace advice given to you by your health care provider. Make sure you discuss any questions you have with your health care provider. Document Revised: 10/20/2020 Document Reviewed: 10/20/2020 Elsevier Patient Education  [place].

## 2023-02-14 NOTE — Progress Notes (Signed)
Established Patient Office Visit  Subjective   Patient ID: Cameron Arroyo, male    DOB: 01/18/1951  Age: 72 y.o. MRN: [id number]  Chief Complaint  Patient presents with   Dizziness    X4 days, mostly at night. When he is laying down. He states that it feels as if he is falling.     States that he has been having episodes of dizziness for the past 4 days.  States that it only happens at night while he is laying down and when he moves from side-to-side.  States that it only lasts a few seconds.  Describes it as he feels like he is falling.  States that he has been checking his blood pressure, pulse and blood sugars twice daily adamantly.  Has had a few lower pulse readings, approximately 51-60 over the past week.  States that he eats his last meal approximately 3 PM.  States that he gives himself his insulin injection at approximately 10 PM prior to going to bed.  States that he takes his metoprolol in the morning and is only taking 12.5 mg.  States that he has not done any thing out of the normal far as physical activity.  Otherwise no new medications, no changes in diet.  States that he is drinking approximately 20 ounces of water a day otherwise drinks Sprite Zero.      Past Medical History:  Diagnosis Date   Arthritis    Asthma    as child   Atrial flutter with rapid ventricular response ([place]) 08/20/2019   Lumbar disc herniation 11/01/2019   New onset type 2 diabetes mellitus ([place]) 08/20/2019   Pneumonia    history of   Social History   Socioeconomic History   Marital status: Married    Spouse name: Not on file   Number of children: Not on file   Years of education: Not on file   Highest education level: Not on file  Occupational History   Not on file  Tobacco Use   Smoking status: Former    Types: Cigarettes    Quit date: 11/29/1992    Years since quitting: 30.2   Smokeless tobacco: Never  Vaping Use   Vaping Use: Never used  Substance and Sexual Activity    Alcohol use: No   Drug use: Never   Sexual activity: Not Currently  Other Topics Concern   Not on file  Social History Narrative   Not on file   Social Determinants of Health   Financial Resource Strain: Not on file  Food Insecurity: Not on file  Transportation Needs: Not on file  Physical Activity: Not on file  Stress: Not on file  Social Connections: Not on file  Intimate Partner Violence: Not on file   Family History  Problem Relation Age of Onset   Cirrhosis Mother    Cancer Father    No Known Allergies  Review of Systems  Constitutional:  Negative for chills and fever.  HENT:  Negative for ear pain, hearing loss, sinus pain and tinnitus.   Respiratory:  Negative for shortness of breath.   Cardiovascular:  Negative for chest pain and palpitations.  Gastrointestinal:  Negative for nausea and vomiting.  Genitourinary:  Negative for dysuria and frequency.  Musculoskeletal:  Negative for back pain.  Skin: Negative.   Neurological:  Positive for dizziness. Negative for sensory change, speech change, weakness and headaches.  Endo/Heme/Allergies: Negative.   Psychiatric/Behavioral: Negative.        Objective:  BP 126/73 (BP Location: Left Arm, Patient Position: Sitting, Cuff Size: Large)   Pulse 66   Ht 6\' 3"  (1.905 m)   Wt 185 lb (83.9 kg)   SpO2 97%   BMI 23.12 kg/m  BP Readings from Last 3 Encounters:  02/14/23 126/73  11/30/22 121/73  10/11/22 120/66   Wt Readings from Last 3 Encounters:  02/14/23 185 lb (83.9 kg)  11/30/22 190 lb (86.2 kg)  10/11/22 181 lb (82.1 kg)      Physical Exam Vitals and nursing note reviewed.  Constitutional:      Appearance: Normal appearance.  HENT:     Head: Normocephalic and atraumatic.     Right Ear: Tympanic membrane, ear canal and external ear normal. There is no impacted cerumen.     Left Ear: Tympanic membrane, ear canal and external ear normal. There is no impacted cerumen.     Nose: Nose normal.      Mouth/Throat:     Mouth: Mucous membranes are moist.     Pharynx: Oropharynx is clear.  Eyes:     Extraocular Movements: Extraocular movements intact.     Conjunctiva/sclera: Conjunctivae normal.     Pupils: Pupils are equal, round, and reactive to light.  Cardiovascular:     Rate and Rhythm: Normal rate and regular rhythm.     Pulses: Normal pulses.     Heart sounds: Normal heart sounds.  Pulmonary:     Effort: Pulmonary effort is normal.     Breath sounds: Normal breath sounds.  Musculoskeletal:        General: Normal range of motion.     Cervical back: Normal range of motion and neck supple.  Skin:    General: Skin is warm.  Neurological:     General: No focal deficit present.     Mental Status: He is alert and oriented to person, place, and time.     Cranial Nerves: No cranial nerve deficit.  Psychiatric:        Mood and Affect: Mood normal.        Behavior: Behavior normal.        Thought Content: Thought content normal.        Judgment: Judgment normal.       Assessment & Plan:   Problem List Items Addressed This Visit       Cardiovascular and Mediastinum   PAF (paroxysmal atrial fibrillation) (HCC)     Endocrine   Type 2 diabetes mellitus, controlled ([place])   Relevant Orders   POCT glycosylated hemoglobin (Hb A1C) (Completed)   Other Visit Diagnoses     Dizziness and giddiness    -  Primary   Relevant Orders   CBC with Differential/Platelet   Comp. Metabolic Panel (12)   TSH     1. Dizziness and giddiness Patient encouraged to increase water intake, continue checking blood glucose, blood pressure and pulse rates, consider checking during episodes of dizziness.  Patient education given on supportive care.  Red flags given for prompt reevaluation - CBC with Differential/Platelet - Comp. Metabolic Panel (12) - TSH  2. Controlled type 2 diabetes mellitus with hyperglycemia, with long-term current use of insulin (HCC) A1c 6.5 - POCT glycosylated  hemoglobin (Hb A1C)  3. PAF (paroxysmal atrial fibrillation) (HCC) Well-controlled, no episodes since initial episode Sept 2020   I have reviewed the patient's medical history (PMH, PSH, Social History, Family History, Medications, and allergies) , and have been updated if relevant. I spent 30 minutes reviewing chart  and  face to face time with patient.   Return if symptoms worsen or fail to improve.    Loraine Grip Mayers, PA-C

## 2023-02-14 NOTE — Telephone Encounter (Signed)
  Chief Complaint: Dizziness since Thursday Symptoms: Feels like he is falling when he rolls over, or stands up Frequency: Thursday  - intermittent Pertinent Negatives: Patient denies Pt has good BS, and BP Disposition: [] ED /[] Urgent Care (no appt availability in office) / [] Appointment(In office/virtual)/ []  [facility]/ [] Home Care/ [] Refused Recommended Disposition /[x] [facility]/ []  Follow-up with PCP Additional Notes: PT states that intermittently since Thursday he has had dizziness. It seems to happen when he rolls over or gets up. He states he feels like he is falling.  Pt reports blood sugar readings 2x daily of 83-109, and Bps of 110/67-126/74.  PT thinks he may need some sort of brain scan.  Summary: dizziness   Pt stated he is a diabetic and When ever pt is sleep or laying down and rolls over he gets woozy headed or dizzy/ pt asked what he needs to do and if he should see Dr. Joya Gaskins or a specialist     Reason for Disposition  [1] MODERATE dizziness (e.g., interferes with normal activities) AND [2] has NOT been evaluated by doctor (or NP/PA) for this  (Exception: Dizziness caused by heat exposure, sudden standing, or poor fluid intake.)  Answer Assessment - Initial Assessment Questions 1. DESCRIPTION: "Describe your dizziness."     Laying down  - rolls over, gets dizzy, feels like he is falling 2. LIGHTHEADED: "Do you feel lightheaded?" (e.g., somewhat faint, woozy, weak upon standing)     When he stands up as well. 3. VERTIGO: "Do you feel like either you or the room is spinning or tilting?" (i.e. vertigo)     no 4. SEVERITY: "How bad is it?"  "Do you feel like you are going to faint?" "Can you stand and walk?"   - MILD: Feels slightly dizzy, but walking normally.   - MODERATE: Feels unsteady when walking, but not falling; interferes with normal activities (e.g., school, work).   - SEVERE: Unable to walk without falling, or requires assistance to walk  without falling; feels like passing out now.      moderate 5. ONSET:  "When did the dizziness begin?"     Thursday 6. AGGRAVATING FACTORS: "Does anything make it worse?" (e.g., standing, change in head position)     Rolling over 7. HEART RATE: "Can you tell me your heart rate?" "How many beats in 15 seconds?"  (Note: not all patients can do this)       72-51 8. CAUSE: "What do you think is causing the dizziness?"     Unsure 9. RECURRENT SYMPTOM: "Have you had dizziness before?" If Yes, ask: "When was the last time?" "What happened that time?"     no 10. OTHER SYMPTOMS: "Do you have any other symptoms?" (e.g., fever, chest pain, vomiting, diarrhea, bleeding)       no  Protocols used: Dizziness - Lightheadedness-A-AH

## 2023-02-15 LAB — CBC WITH DIFFERENTIAL/PLATELET
Basophils Absolute: 0 10*3/uL (ref 0.0–0.2)
Basos: 0 %
EOS (ABSOLUTE): 0 10*3/uL (ref 0.0–0.4)
Eos: 1 %
Hematocrit: 44.6 % (ref 37.5–51.0)
Hemoglobin: 15.3 g/dL (ref 13.0–17.7)
Immature Grans (Abs): 0 10*3/uL (ref 0.0–0.1)
Immature Granulocytes: 0 %
Lymphocytes Absolute: 1.5 10*3/uL (ref 0.7–3.1)
Lymphs: 22 %
MCH: 30.7 pg (ref 26.6–33.0)
MCHC: 34.3 g/dL (ref 31.5–35.7)
MCV: 89 fL (ref 79–97)
Monocytes Absolute: 0.4 10*3/uL (ref 0.1–0.9)
Monocytes: 5 %
Neutrophils Absolute: 4.8 10*3/uL (ref 1.4–7.0)
Neutrophils: 72 %
Platelets: 217 10*3/uL (ref 150–450)
RBC: 4.99 x10E6/uL (ref 4.14–5.80)
RDW: 12.1 % (ref 11.6–15.4)
WBC: 6.7 10*3/uL (ref 3.4–10.8)

## 2023-02-15 LAB — TSH: TSH: 2.19 u[IU]/mL (ref 0.450–4.500)

## 2023-02-15 LAB — COMP. METABOLIC PANEL (12)
AST: 13 IU/L (ref 0–40)
Albumin/Globulin Ratio: 2.5 — ABNORMAL HIGH (ref 1.2–2.2)
Albumin: 4.7 g/dL (ref 3.9–4.9)
Alkaline Phosphatase: 86 IU/L (ref 44–121)
BUN/Creatinine Ratio: 20 (ref 10–24)
BUN: 15 mg/dL (ref 8–27)
Bilirubin Total: 0.7 mg/dL (ref 0.0–1.2)
Calcium: 9.8 mg/dL (ref 8.6–10.2)
Chloride: 101 mmol/L (ref 96–106)
Creatinine, Ser: 0.76 mg/dL (ref 0.76–1.27)
Globulin, Total: 1.9 g/dL (ref 1.5–4.5)
Glucose: 151 mg/dL — ABNORMAL HIGH (ref 70–99)
Potassium: 4.5 mmol/L (ref 3.5–5.2)
Sodium: 141 mmol/L (ref 134–144)
Total Protein: 6.6 g/dL (ref 6.0–8.5)
eGFR: 97 mL/min/{1.73_m2} (ref >59)

## 2023-02-28 ENCOUNTER — Telehealth: Payer: Self-pay | Admitting: Critical Care Medicine

## 2023-02-28 NOTE — Telephone Encounter (Signed)
Called patient to schedule Medicare Annual Wellness Visit (AWV). Left message for patient to call back and schedule Medicare Annual Wellness Visit (AWV).  Last date of AWV: awvi 12/30/20 per [facility]     If any questions, please contact me at [phone].  Thank you ,  Barkley Boards AWV direct phone # [phone]

## 2023-03-01 NOTE — Telephone Encounter (Signed)
Contacted Cameron Arroyo to schedule their annual wellness visit. Appointment made for 03/16/23.  Cameron Arroyo AWV direct phone # [phone]

## 2023-03-16 ENCOUNTER — Ambulatory Visit: Payer: Medicare HMO | Attending: Critical Care Medicine

## 2023-03-16 VITALS — Ht 75.0 in | Wt 185.0 lb

## 2023-03-16 DIAGNOSIS — Z0000 Encounter for general adult medical examination without abnormal findings: Secondary | ICD-10-CM

## 2023-03-16 NOTE — Patient Instructions (Signed)
Cameron Arroyo , Thank you for taking time to come for your Medicare Wellness Visit. I appreciate your ongoing commitment to your health goals. Please review the following plan we discussed and let me know if I can assist you in the future.   These are the goals we discussed:  Goals      Patient Stated     03/16/2023, no goals        This is a list of the screening recommended for you and due dates:  Health Maintenance  Topic Date Due   Eye exam for diabetics  02/19/2021   Complete foot exam   01/25/2023   Colon Cancer Screening  02/19/2023   Hemoglobin A1C  08/17/2023   Yearly kidney health urinalysis for diabetes  10/13/2023   Yearly kidney function blood test for diabetes  02/14/2024   Medicare Annual Wellness Visit  03/15/2024   DTaP/Tdap/Td vaccine (2 - Td or Tdap) 06/10/2028   Hepatitis C Screening: USPSTF Recommendation to screen - Ages 59-79 yo.  Completed   HPV Vaccine  Aged Out   Pneumonia Vaccine  Discontinued   Flu Shot  Discontinued   COVID-19 Vaccine  Discontinued   Zoster (Shingles) Vaccine  Discontinued    Advanced directives: Advance directive discussed with you today.   Conditions/risks identified: none  Next appointment: Follow up in one year for your annual wellness visit.   Preventive Care 18 Years and Older, Male  Preventive care refers to lifestyle choices and visits with your health care provider that can promote health and wellness. What does preventive care include? A yearly physical exam. This is also called an annual well check. Dental exams once or twice a year. Routine eye exams. Ask your health care provider how often you should have your eyes checked. Personal lifestyle choices, including: Daily care of your teeth and gums. Regular physical activity. Eating a healthy diet. Avoiding tobacco and drug use. Limiting alcohol use. Practicing safe sex. Taking low doses of aspirin every day. Taking vitamin and mineral supplements as recommended by  your health care provider. What happens during an annual well check? The services and screenings done by your health care provider during your annual well check will depend on your age, overall health, lifestyle risk factors, and family history of disease. Counseling  Your health care provider may ask you questions about your: Alcohol use. Tobacco use. Drug use. Emotional well-being. Home and relationship well-being. Sexual activity. Eating habits. History of falls. Memory and ability to understand (cognition). Work and work Astronomer. Screening  You may have the following tests or measurements: Height, weight, and BMI. Blood pressure. Lipid and cholesterol levels. These may be checked every 5 years, or more frequently if you are over 22 years old. Skin check. Lung cancer screening. You may have this screening every year starting at age 9 if you have a 30-pack-year history of smoking and currently smoke or have quit within the past 15 years. Fecal occult blood test (FOBT) of the stool. You may have this test every year starting at age 26. Flexible sigmoidoscopy or colonoscopy. You may have a sigmoidoscopy every 5 years or a colonoscopy every 10 years starting at age 13. Prostate cancer screening. Recommendations will vary depending on your family history and other risks. Hepatitis C blood test. Hepatitis B blood test. Sexually transmitted disease (STD) testing. Diabetes screening. This is done by checking your blood sugar (glucose) after you have not eaten for a while (fasting). You may have this done every  1-3 years. Abdominal aortic aneurysm (AAA) screening. You may need this if you are a current or former smoker. Osteoporosis. You may be screened starting at age 3 if you are at high risk. Talk with your health care provider about your test results, treatment options, and if necessary, the need for more tests. Vaccines  Your health care provider may recommend certain vaccines,  such as: Influenza vaccine. This is recommended every year. Tetanus, diphtheria, and acellular pertussis (Tdap, Td) vaccine. You may need a Td booster every 10 years. Zoster vaccine. You may need this after age 50. Pneumococcal 13-valent conjugate (PCV13) vaccine. One dose is recommended after age 27. Pneumococcal polysaccharide (PPSV23) vaccine. One dose is recommended after age 34. Talk to your health care provider about which screenings and vaccines you need and how often you need them. This information is not intended to replace advice given to you by your health care provider. Make sure you discuss any questions you have with your health care provider. Document Released: 12/12/2015 Document Revised: 08/04/2016 Document Reviewed: 09/16/2015 Elsevier Interactive Patient Education  2017 [place] Prevention in the Home Falls can cause injuries. They can happen to people of all ages. There are many things you can do to make your home safe and to help prevent falls. What can I do on the outside of my home? Regularly fix the edges of walkways and driveways and fix any cracks. Remove anything that might make you trip as you walk through a door, such as a raised step or threshold. Trim any bushes or trees on the path to your home. Use bright outdoor lighting. Clear any walking paths of anything that might make someone trip, such as rocks or tools. Regularly check to see if handrails are loose or broken. Make sure that both sides of any steps have handrails. Any raised decks and porches should have guardrails on the edges. Have any leaves, snow, or ice cleared regularly. Use sand or salt on walking paths during winter. Clean up any spills in your garage right away. This includes oil or grease spills. What can I do in the bathroom? Use night lights. Install grab bars by the toilet and in the tub and shower. Do not use towel bars as grab bars. Use non-skid mats or decals in the tub or  shower. If you need to sit down in the shower, use a plastic, non-slip stool. Keep the floor dry. Clean up any water that spills on the floor as soon as it happens. Remove soap buildup in the tub or shower regularly. Attach bath mats securely with double-sided non-slip rug tape. Do not have throw rugs and other things on the floor that can make you trip. What can I do in the bedroom? Use night lights. Make sure that you have a light by your bed that is easy to reach. Do not use any sheets or blankets that are too big for your bed. They should not hang down onto the floor. Have a firm chair that has side arms. You can use this for support while you get dressed. Do not have throw rugs and other things on the floor that can make you trip. What can I do in the kitchen? Clean up any spills right away. Avoid walking on wet floors. Keep items that you use a lot in easy-to-reach places. If you need to reach something above you, use a strong step stool that has a grab bar. Keep electrical cords out of the way.  Do not use floor polish or wax that makes floors slippery. If you must use wax, use non-skid floor wax. Do not have throw rugs and other things on the floor that can make you trip. What can I do with my stairs? Do not leave any items on the stairs. Make sure that there are handrails on both sides of the stairs and use them. Fix handrails that are broken or loose. Make sure that handrails are as long as the stairways. Check any carpeting to make sure that it is firmly attached to the stairs. Fix any carpet that is loose or worn. Avoid having throw rugs at the top or bottom of the stairs. If you do have throw rugs, attach them to the floor with carpet tape. Make sure that you have a light switch at the top of the stairs and the bottom of the stairs. If you do not have them, ask someone to add them for you. What else can I do to help prevent falls? Wear shoes that: Do not have high heels. Have  rubber bottoms. Are comfortable and fit you well. Are closed at the toe. Do not wear sandals. If you use a stepladder: Make sure that it is fully opened. Do not climb a closed stepladder. Make sure that both sides of the stepladder are locked into place. Ask someone to hold it for you, if possible. Clearly mark and make sure that you can see: Any grab bars or handrails. First and last steps. Where the edge of each step is. Use tools that help you move around (mobility aids) if they are needed. These include: Canes. Walkers. Scooters. Crutches. Turn on the lights when you go into a dark area. Replace any light bulbs as soon as they burn out. Set up your furniture so you have a clear path. Avoid moving your furniture around. If any of your floors are uneven, fix them. If there are any pets around you, be aware of where they are. Review your medicines with your doctor. Some medicines can make you feel dizzy. This can increase your chance of falling. Ask your doctor what other things that you can do to help prevent falls. This information is not intended to replace advice given to you by your health care provider. Make sure you discuss any questions you have with your health care provider. Document Released: 09/11/2009 Document Revised: 04/22/2016 Document Reviewed: 12/20/2014 Elsevier Interactive Patient Education  2017 [employer].

## 2023-03-16 NOTE — Progress Notes (Signed)
I connected with  Cameron Arroyo on 03/16/23 by a audio enabled telemedicine application and verified that I am speaking with the correct person using two identifiers.  Patient Location: Home  Provider Location: Office/Clinic  I discussed the limitations of evaluation and management by telemedicine. The patient expressed understanding and agreed to proceed.  Subjective:   Cameron Arroyo is a 72 y.o. male who presents for an Initial Medicare Annual Wellness Visit.  Review of Systems     Cardiac Risk Factors include: advanced age (>30men, >26 women);diabetes mellitus;dyslipidemia;male gender     Objective:    Today's Vitals   03/16/23 0911  Weight: 185 lb (83.9 kg)  Height: 6\' 3"  (1.905 m)   Body mass index is 23.12 kg/m.     03/16/2023    9:13 AM 05/20/2020    9:27 AM 10/30/2019    9:23 AM 08/22/2019   10:56 AM 02/23/2013    3:08 PM  Advanced Directives  Does Patient Have a Medical Advance Directive? No No No No Patient does not have advance directive;Patient would like information  Would patient like information on creating a medical advance directive?   Yes (MAU/Ambulatory/Procedural Areas - Information given) No - Patient declined Advance directive packet given    Current Medications (verified) Outpatient Encounter Medications as of 03/16/2023  Medication Sig   apixaban (ELIQUIS) 5 MG TABS tablet Take 1 tablet (5 mg total) by mouth 2 (two) times daily.   atorvastatin (LIPITOR) 10 MG tablet Take 1 tablet (10 mg total) by mouth daily.   BD PEN NEEDLE NANO 2ND GEN 32G X 4 MM MISC Use with Evaristo Bury Flextouch   Blood Glucose Monitoring Suppl (ONETOUCH VERIO REFLECT) w/Device KIT Use to check blood sugar once daily. Dx E11.65   Blood Glucose Monitoring Suppl (ONETOUCH VERIO) w/Device KIT Use to check blood sugar once daily. Dx E11.65   glucose blood (ONETOUCH VERIO) test strip Use to check blood sugar once daily. Dx E11.65   insulin degludec (TRESIBA FLEXTOUCH) 100 UNIT/ML  FlexTouch Pen Inject 10 Units into the skin daily.   metFORMIN (GLUCOPHAGE) 1000 MG tablet Take 1 tablet (1,000 mg total) by mouth 2 (two) times daily with a meal.   metoprolol tartrate (LOPRESSOR) 25 MG tablet Take 0.5 tablets (12.5 mg total) by mouth daily.   OneTouch Delica Lancets 33G MISC Use to check blood sugar once daily. Dx E11.65   No facility-administered encounter medications on file as of 03/16/2023.    Allergies (verified) Patient has no known allergies.   History: Past Medical History:  Diagnosis Date   Arthritis    Asthma    as child   Atrial flutter with rapid ventricular response 08/20/2019   Lumbar disc herniation 11/01/2019   New onset type 2 diabetes mellitus 08/20/2019   Pneumonia    history of   Past Surgical History:  Procedure Laterality Date   ANTERIOR CERVICAL DECOMP/DISCECTOMY FUSION N/A 03/01/2013   Procedure: Cervical Four-Five Cervical Five-Six Cervical Six-Seven anterior cervical decompression with fusion plating and bonegraft;  Surgeon: Hewitt Shorts, MD;  Location: [facility];  Service: Neurosurgery;  Laterality: N/A;  ANTERIOR CERVICAL DECOMPRESSION/DISCECTOMY FUSION 3 LEVELS   CARDIOVERSION N/A 08/22/2019   Procedure: CARDIOVERSION;  Surgeon: Little Ishikawa, MD;  Location: [facility] ENDOSCOPY;  Service: Endoscopy;  Laterality: N/A;   TEE WITHOUT CARDIOVERSION N/A 08/22/2019   Procedure: TRANSESOPHAGEAL ECHOCARDIOGRAM (TEE);  Surgeon: Little Ishikawa, MD;  Location: [facility] ENDOSCOPY;  Service: Endoscopy;  Laterality: N/A;   Family History  Problem Relation Age of Onset   Cirrhosis Mother    Cancer Father    Social History   Socioeconomic History   Marital status: Married    Spouse name: Not on file   Number of children: Not on file   Years of education: Not on file   Highest education level: Not on file  Occupational History   Not on file  Tobacco Use   Smoking status: Former    Types: Cigarettes    Quit date: 11/29/1992     Years since quitting: 30.3   Smokeless tobacco: Never  Vaping Use   Vaping Use: Never used  Substance and Sexual Activity   Alcohol use: No   Drug use: Never   Sexual activity: Not Currently  Other Topics Concern   Not on file  Social History Narrative   Not on file   Social Determinants of Health   Financial Resource Strain: Low Risk  (03/16/2023)   Overall Financial Resource Strain (CARDIA)    Difficulty of Paying Living Expenses: Not hard at all  Food Insecurity: No Food Insecurity (03/16/2023)   Hunger Vital Sign    Worried About Running Out of Food in the Last Year: Never true    Ran Out of Food in the Last Year: Never true  Transportation Needs: No Transportation Needs (03/16/2023)   PRAPARE - Administrator, Civil Service (Medical): No    Lack of Transportation (Non-Medical): No  Physical Activity: Inactive (03/16/2023)   Exercise Vital Sign    Days of Exercise per Week: 0 days    Minutes of Exercise per Session: 0 min  Stress: No Stress Concern Present (03/16/2023)   [employer] of Occupational Health - Occupational Stress Questionnaire    Feeling of Stress : Not at all  Social Connections: Not on file    Tobacco Counseling Counseling given: Not Answered   Clinical Intake:  Pre-visit preparation completed: Yes  Pain : No/denies pain     Nutritional Status: BMI of 19-24  Normal Nutritional Risks: None Diabetes: Yes  How often do you need to have someone help you when you read instructions, pamphlets, or other written materials from your doctor or pharmacy?: 1 - Never  Diabetic? Yes Nutrition Risk Assessment:  Has the patient had any N/V/D within the last 2 months?  No  Does the patient have any non-healing wounds?  No  Has the patient had any unintentional weight loss or weight gain?  No   Diabetes:  Is the patient diabetic?  Yes  If diabetic, was a CBG obtained today?  No  Did the patient bring in their glucometer from home?  No   How often do you monitor your CBG's? Twice daily.   Financial Strains and Diabetes Management:  Are you having any financial strains with the device, your supplies or your medication? No .  Does the patient want to be seen by Chronic Care Management for management of their diabetes?  No  Would the patient like to be referred to a Nutritionist or for Diabetic Management?  No   Diabetic Exams:  Diabetic Eye Exam: Overdue for diabetic eye exam. Pt has been advised about the importance in completing this exam. Patient advised to call and schedule an eye exam. Diabetic Foot Exam: Overdue, Pt has been advised about the importance in completing this exam. Pt is scheduled for diabetic foot exam on next appointment.   Interpreter Needed?: No  Information entered by :: NAllen LPN  Activities of Daily Living    03/16/2023    9:14 AM  In your present state of health, do you have any difficulty performing the following activities:  Hearing? 0  Vision? 0  Difficulty concentrating or making decisions? 1  Walking or climbing stairs? 0  Dressing or bathing? 0  Doing errands, shopping? 0  Preparing Food and eating ? N  Using the Toilet? N  In the past six months, have you accidently leaked urine? N  Do you have problems with loss of bowel control? N  Managing your Medications? N  Managing your Finances? N  Housekeeping or managing your Housekeeping? N    Patient Care Team: Storm Frisk, MD as PCP - General (Pulmonary Disease) Rennis Golden Lisette Abu, MD as PCP - Cardiology (Cardiology)  Indicate any recent Medical Services you may have received from other than [facility] providers in the past year (date may be approximate).     Assessment:   This is a routine wellness examination for Rhyan.  Hearing/Vision screen Vision Screening - Comments:: No regular eye exams  Dietary issues and exercise activities discussed: Current Exercise Habits: The patient does not participate in regular  exercise at present   Goals Addressed             This Visit's Progress    Patient Stated       03/16/2023, no goals       Depression Screen    03/16/2023    9:14 AM 02/14/2023    9:59 AM 11/30/2022    9:00 AM 10/11/2022    3:46 PM 01/27/2021    8:40 AM 10/01/2020    9:11 AM 05/20/2020    9:29 AM  PHQ 2/9 Scores  PHQ - 2 Score 0    0 0 0  PHQ- 9 Score     0 0 0  Exception Documentation  Patient refusal Patient refusal Patient refusal       Fall Risk    03/16/2023    9:14 AM 02/14/2023    9:59 AM 11/30/2022    8:59 AM 01/25/2022    8:42 AM 08/25/2021    9:26 AM  Fall Risk   Falls in the past year? 0 0 0 0 0  Number falls in past yr: 0 0 0 0 0  Injury with Fall? 0 0 0 0 0  Risk for fall due to : Medication side effect No Fall Risks No Fall Risks No Fall Risks No Fall Risks  Follow up Falls prevention discussed;Education provided;Falls evaluation completed Falls evaluation completed       FALL RISK PREVENTION PERTAINING TO THE HOME:  Any stairs in or around the home? Yes  If so, are there any without handrails? No  Home free of loose throw rugs in walkways, pet beds, electrical cords, etc? Yes  Adequate lighting in your home to reduce risk of falls? Yes   ASSISTIVE DEVICES UTILIZED TO PREVENT FALLS:  Life alert? No  Use of a cane, walker or w/c? No  Grab bars in the bathroom? No  Shower chair or bench in shower? No  Elevated toilet seat or a handicapped toilet? Yes   TIMED UP AND GO:  Was the test performed? No .       Cognitive Function:        03/16/2023    9:15 AM  6CIT Screen  What Year? 0 points  What month? 0 points  What time? 0 points  Count back from 20 0 points  Months in reverse 4 points  Repeat phrase 2 points  Total Score 6 points    Immunizations Immunization History  Administered Date(s) Administered   Tdap 06/10/2018    TDAP status: Up to date  Flu Vaccine status: Declined, Education has been provided regarding the importance of  this vaccine but patient still declined. Advised may receive this vaccine at local pharmacy or Health Dept. Aware to provide a copy of the vaccination record if obtained from local pharmacy or Health Dept. Verbalized acceptance and understanding.  Pneumococcal vaccine status: Declined,  Education has been provided regarding the importance of this vaccine but patient still declined. Advised may receive this vaccine at local pharmacy or Health Dept. Aware to provide a copy of the vaccination record if obtained from local pharmacy or Health Dept. Verbalized acceptance and understanding.   Covid-19 vaccine status: Declined, Education has been provided regarding the importance of this vaccine but patient still declined. Advised may receive this vaccine at local pharmacy or Health Dept.or vaccine clinic. Aware to provide a copy of the vaccination record if obtained from local pharmacy or Health Dept. Verbalized acceptance and understanding.  Qualifies for Shingles Vaccine? Yes   Zostavax completed No   Shingrix Completed?: No.    Education has been provided regarding the importance of this vaccine. Patient has been advised to call insurance company to determine out of pocket expense if they have not yet received this vaccine. Advised may also receive vaccine at local pharmacy or Health Dept. Verbalized acceptance and understanding.  Screening Tests Health Maintenance  Topic Date Due   Medicare Annual Wellness (AWV)  Never done   OPHTHALMOLOGY EXAM  02/19/2021   FOOT EXAM  01/25/2023   COLONOSCOPY (Pts 45-24yrs Insurance coverage will need to be confirmed)  02/19/2023   HEMOGLOBIN A1C  08/17/2023   Diabetic kidney evaluation - Urine ACR  10/13/2023   Diabetic kidney evaluation - eGFR measurement  02/14/2024   DTaP/Tdap/Td (2 - Td or Tdap) 06/10/2028   Hepatitis C Screening  Completed   HPV VACCINES  Aged Out   Pneumonia Vaccine 60+ Years old  Discontinued   INFLUENZA VACCINE  Discontinued    COVID-19 Vaccine  Discontinued   Zoster Vaccines- Shingrix  Discontinued    Health Maintenance  Health Maintenance Due  Topic Date Due   Medicare Annual Wellness (AWV)  Never done   OPHTHALMOLOGY EXAM  02/19/2021   FOOT EXAM  01/25/2023   COLONOSCOPY (Pts 45-51yrs Insurance coverage will need to be confirmed)  02/19/2023    Colorectal cancer screening: Type of screening: Colonoscopy. Completed 02/19/2020. Repeat every 3 years  Lung Cancer Screening: (Low Dose CT Chest recommended if Age 72-80 years, 30 pack-year currently smoking OR have quit w/in 15years.) does not qualify.   Lung Cancer Screening Referral: no  Additional Screening:  Hepatitis C Screening: does qualify; Completed 09/11/2019  Vision Screening: Recommended annual ophthalmology exams for early detection of glaucoma and other disorders of the eye. Is the patient up to date with their annual eye exam?  No  Who is the provider or what is the name of the office in which the patient attends annual eye exams? no If pt is not established with a provider, would they like to be referred to a provider to establish care? No .   Dental Screening: Recommended annual dental exams for proper oral hygiene  Community Resource Referral / Chronic Care Management: CRR required this visit?  No   CCM required this visit?  No  Plan:     I have personally reviewed and noted the following in the patient's chart:   Medical and social history Use of alcohol, tobacco or illicit drugs  Current medications and supplements including opioid prescriptions. Patient is not currently taking opioid prescriptions. Functional ability and status Nutritional status Physical activity Advanced directives List of other physicians Hospitalizations, surgeries, and ER visits in previous 12 months Vitals Screenings to include cognitive, depression, and falls Referrals and appointments  In addition, I have reviewed and discussed with patient  certain preventive protocols, quality metrics, and best practice recommendations. A written personalized care plan for preventive services as well as general preventive health recommendations were provided to patient.     Barb Merino, LPN   1/61/0960   Nurse Notes: none  Due to this being a virtual visit, the after visit summary with patients personalized plan was offered to patient via mail or my-chart.  Patient would like to access on my-chart

## 2023-03-22 ENCOUNTER — Other Ambulatory Visit: Payer: Self-pay | Admitting: Critical Care Medicine

## 2023-03-22 DIAGNOSIS — E1165 Type 2 diabetes mellitus with hyperglycemia: Secondary | ICD-10-CM

## 2023-03-22 NOTE — Telephone Encounter (Signed)
Requested Prescriptions  Pending Prescriptions Disp Refills   BD PEN NEEDLE NANO 2ND GEN 32G X 4 MM MISC [Pharmacy Med Name: BD NANO 2 GEN PEN NDL 32G 4MM] 100 each 1    Sig: USE WITH TRESIBA [facility]     Endocrinology: Diabetes - Testing Supplies Passed - 03/22/2023  9:14 AM      Passed - Valid encounter within last 12 months    Recent Outpatient Visits           3 months ago Controlled type 2 diabetes mellitus with hyperglycemia, with long-term current use of insulin [facility])   [facility] Storm Frisk, MD   1 year ago Controlled type 2 diabetes mellitus with hyperglycemia, with long-term current use of insulin [facility])   [place] [facility] Storm Frisk, MD   1 year ago Controlled type 2 diabetes mellitus with hyperglycemia, with long-term current use of insulin [facility])   [place] [facility] Storm Frisk, MD   2 years ago Controlled type 2 diabetes mellitus with hyperglycemia, with long-term current use of insulin [facility])   [place] [facility] Storm Frisk, MD   2 years ago Controlled type 2 diabetes mellitus with hyperglycemia, with long-term current use of insulin [facility])    [facility] Storm Frisk, MD       Future Appointments             In 2 months Delford Field Charlcie Cradle, MD [facility]

## 2023-05-31 ENCOUNTER — Ambulatory Visit: Payer: Medicare HMO | Attending: Critical Care Medicine | Admitting: Critical Care Medicine

## 2023-05-31 ENCOUNTER — Encounter: Payer: Self-pay | Admitting: Critical Care Medicine

## 2023-05-31 VITALS — BP 123/76 | HR 49 | Wt 186.8 lb

## 2023-05-31 DIAGNOSIS — I480 Paroxysmal atrial fibrillation: Secondary | ICD-10-CM

## 2023-05-31 DIAGNOSIS — E1169 Type 2 diabetes mellitus with other specified complication: Secondary | ICD-10-CM

## 2023-05-31 DIAGNOSIS — E785 Hyperlipidemia, unspecified: Secondary | ICD-10-CM | POA: Diagnosis not present

## 2023-05-31 DIAGNOSIS — Z794 Long term (current) use of insulin: Secondary | ICD-10-CM | POA: Diagnosis not present

## 2023-05-31 DIAGNOSIS — Z7984 Long term (current) use of oral hypoglycemic drugs: Secondary | ICD-10-CM | POA: Diagnosis not present

## 2023-05-31 DIAGNOSIS — E1165 Type 2 diabetes mellitus with hyperglycemia: Secondary | ICD-10-CM

## 2023-05-31 DIAGNOSIS — K056 Periodontal disease, unspecified: Secondary | ICD-10-CM

## 2023-05-31 DIAGNOSIS — K635 Polyp of colon: Secondary | ICD-10-CM

## 2023-05-31 DIAGNOSIS — L84 Corns and callosities: Secondary | ICD-10-CM

## 2023-05-31 DIAGNOSIS — B351 Tinea unguium: Secondary | ICD-10-CM

## 2023-05-31 LAB — GLUCOSE, POCT (MANUAL RESULT ENTRY): POC Glucose: 102 mg/dl — AB (ref 70–99)

## 2023-05-31 LAB — POCT GLYCOSYLATED HEMOGLOBIN (HGB A1C): HbA1c, POC (controlled diabetic range): 5.8 % (ref 0.0–7.0)

## 2023-05-31 MED ORDER — TRESIBA FLEXTOUCH 100 UNIT/ML ~~LOC~~ SOPN
10.0000 [IU] | PEN_INJECTOR | Freq: Every day | SUBCUTANEOUS | 3 refills | Status: DC
Start: 2023-05-31 — End: 2023-10-26

## 2023-05-31 MED ORDER — ATORVASTATIN CALCIUM 10 MG PO TABS: 10.0000 mg | ORAL_TABLET | Freq: Every day | ORAL | 1 refills | Status: DC

## 2023-05-31 MED ORDER — APIXABAN 5 MG PO TABS: 5.0000 mg | ORAL_TABLET | Freq: Two times a day (BID) | ORAL | 1 refills | Status: DC

## 2023-05-31 MED ORDER — METOPROLOL TARTRATE 25 MG PO TABS
12.5000 mg | ORAL_TABLET | Freq: Every day | ORAL | 1 refills | Status: DC
Start: 2023-05-31 — End: 2023-10-26

## 2023-05-31 MED ORDER — METFORMIN HCL 1000 MG PO TABS
1000.0000 mg | ORAL_TABLET | Freq: Two times a day (BID) | ORAL | 1 refills | Status: DC
Start: 2023-05-31 — End: 2023-10-26

## 2023-05-31 MED ORDER — BD PEN NEEDLE NANO 2ND GEN 32G X 4 MM MISC: 1 refills | Status: DC

## 2023-05-31 NOTE — Assessment & Plan Note (Signed)
Currently remains in sinus rhythm continue low-dose metoprolol and apixaban

## 2023-05-31 NOTE — Assessment & Plan Note (Signed)
Has yet to see dental 

## 2023-05-31 NOTE — Patient Instructions (Signed)
Labs : cholesterol pane Referral to eye, colon, foot doctors made No medication changes  Return to Dr Delford Field 4 months

## 2023-05-31 NOTE — Progress Notes (Signed)
Established Patient Office Visit  Subjective:  Patient ID: Cameron Arroyo, male    DOB: 1951/03/19  Age: 72 y.o. MRN: [id number]  CC:  Chief Complaint  Patient presents with   Diabetes   Medication Refill    HPI 01/25/22 Cameron Arroyo presents for diabetes follow-up.  The patient brings with him a diary blood pressures are in the 100-110/70-80 range at home blood sugars fasting in the 100 range he never has a blood sugar less than 70 and postprandials in the 140 range.  Blood sugar on arrival 101 and blood pressure 114/71.  Patient tends to get up at 7:30 AM and will have a light breakfast at 9:30 AM which includes oatmeal he will have a light snack at 10:30 AM then he will have lunch at noon the wife says occasionally is just popcorn he sometimes skips dinner sometimes he will have a midafternoon dinner which will include ham butter beans and sweet potatoes he does not eat much in the way of salads and does not exercise.  Wife said he sits and watches TV all day and is very hard of hearing.  He has had no tachycardia or issues with his blood pressure or his heart.  He is having no bleeding on Eliquis.  Today the patient needs an A1c and a urine microalbumin he does not need any other labs at this visit.  The patient did declined to receive the pneumococcal vaccine.  He also does not wish to receive the shingles vaccine.  A1C 6.2 this visit   11/30/22  Patient seen in return follow-up has not been seen since February of last year but did [facility] in November for pustular lesion on the back of the neck.  This resolved with antibiotics alone and did self drain. The patient has not had recent tachycardia.  Blood pressure today is 121/73 pulse is 65.  Patient now has new insurance and wants his glucometer from [facility] we tried to send a prescription in December but apparently did not go through we will need to have our pharmacist follow-up on this.  He needs refills on his medications  and wants these to [facility].  His wife is with him and she states he only eats 1 meal a day will not eat after 3 PM.  She states he can be very cross with her and he stays at home all the time sits in his chair watches TV all day.  Patient brings his recordings with him of his data and his blood pressure has been anywhere from 110s to 120/73 continuously blood sugars have been excellent and A1c today is 6.2 which is unchanged from February 2023. Patient has poor dentition but declines to see a dentist at this time.  Patient reminded he needs an eye exam this calendar year.  He also needs follow-up with cardiology.  05/31/23 Patient seen in follow-up brings his diary with him blood pressures have been in the 120/75 range.  His A1c on arrival 5.8 blood sugars have been good today is 102.  He remains compliant with the [place] and metformin.  Patient maintains low-dose metoprolol for paroxysmal atrial fibrillation and apixaban.  Maintains low-dose atorvastatin for hyperlipidemia.  He is complaining that he is on a One Touch system now this is prescribed by the insurance.  Patient would benefit from foot exam.  He needs an eye exam and repeat colonoscopy.  He was not satisfied with the prior  provider of his colonoscopy.  He does have a precancerous polyp in the colon that needs to be rechecked in 3 years.  He will agree to see a second opinion Foot colon eye   Past Medical History:  Diagnosis Date   Arthritis    Asthma    as child   Atrial flutter with rapid ventricular response (HCC) 08/20/2019   Lumbar disc herniation 11/01/2019   New onset type 2 diabetes mellitus (HCC) 08/20/2019   Pneumonia    history of    Past Surgical History:  Procedure Laterality Date   ANTERIOR CERVICAL DECOMP/DISCECTOMY FUSION N/A 03/01/2013   Procedure: Cervical Four-Five Cervical Five-Six Cervical Six-Seven anterior cervical decompression with fusion plating and bonegraft;  Surgeon: Hewitt Shorts,  MD;  Location: [facility];  Service: Neurosurgery;  Laterality: N/A;  ANTERIOR CERVICAL DECOMPRESSION/DISCECTOMY FUSION 3 LEVELS   CARDIOVERSION N/A 08/22/2019   Procedure: CARDIOVERSION;  Surgeon: Little Ishikawa, MD;  Location: [facility] ENDOSCOPY;  Service: Endoscopy;  Laterality: N/A;   TEE WITHOUT CARDIOVERSION N/A 08/22/2019   Procedure: TRANSESOPHAGEAL ECHOCARDIOGRAM (TEE);  Surgeon: Little Ishikawa, MD;  Location[facility] ENDOSCOPY;  Service: Endoscopy;  Laterality: N/A;    Family History  Problem Relation Age of Onset   Cirrhosis Mother    Cancer Father     Social History   Socioeconomic History   Marital status: Married    Spouse name: Not on file   Number of children: Not on file   Years of education: Not on file   Highest education level: Not on file  Occupational History   Not on file  Tobacco Use   Smoking status: Former    Types: Cigarettes    Quit date: 11/29/1992    Years since quitting: 30.5   Smokeless tobacco: Never  Vaping Use   Vaping Use: Never used  Substance and Sexual Activity   Alcohol use: No   Drug use: Never   Sexual activity: Not Currently  Other Topics Concern   Not on file  Social History Narrative   Not on file   Social Determinants of Health   Financial Resource Strain: Low Risk  (03/16/2023)   Overall Financial Resource Strain (CARDIA)    Difficulty of Paying Living Expenses: Not hard at all  Food Insecurity: No Food Insecurity (03/16/2023)   Hunger Vital Sign    Worried About Running Out of Food in the Last Year: Never true    Ran Out of Food in the Last Year: Never true  Transportation Needs: No Transportation Needs (03/16/2023)   PRAPARE - Administrator, Civil Service (Medical): No    Lack of Transportation (Non-Medical): No  Physical Activity: Inactive (03/16/2023)   Exercise Vital Sign    Days of Exercise per Week: 0 days    Minutes of Exercise per Session: 0 min  Stress: No Stress Concern Present (03/16/2023)    [employer] of Occupational Health - Occupational Stress Questionnaire    Feeling of Stress : Not at all  Social Connections: Not on file  Intimate Partner Violence: Not on file    Outpatient Medications Prior to Visit  Medication Sig Dispense Refill   Blood Glucose Monitoring Suppl (ONETOUCH VERIO REFLECT) w/Device KIT Use to check blood sugar once daily. Dx E11.65 1 kit 0   Blood Glucose Monitoring Suppl (ONETOUCH VERIO) w/Device KIT Use to check blood sugar once daily. Dx E11.65 1 kit 0   glucose blood (ONETOUCH VERIO) test strip Use to check blood  sugar once daily. Dx E11.65 100 each 11   OneTouch Delica Lancets 33G MISC Use to check blood sugar once daily. Dx E11.65 100 each 11   apixaban (ELIQUIS) 5 MG TABS tablet Take 1 tablet (5 mg total) by mouth 2 (two) times daily. 180 tablet 1   atorvastatin (LIPITOR) 10 MG tablet Take 1 tablet (10 mg total) by mouth daily. 90 tablet 1   BD PEN NEEDLE NANO 2ND GEN 32G X 4 MM MISC USE WITH TRESIBA FLEXTOUCH 100 each 1   insulin degludec (TRESIBA FLEXTOUCH) 100 UNIT/ML FlexTouch Pen Inject 10 Units into the skin daily. 15 mL 3   metoprolol tartrate (LOPRESSOR) 25 MG tablet Take 0.5 tablets (12.5 mg total) by mouth daily. 45 tablet 1   metFORMIN (GLUCOPHAGE) 1000 MG tablet Take 1 tablet (1,000 mg total) by mouth 2 (two) times daily with a meal. 180 tablet 1   No facility-administered medications prior to visit.    No Known Allergies  ROS Review of Systems  Constitutional: Negative.   HENT:  Positive for dental problem and hearing loss. Negative for ear pain, postnasal drip, rhinorrhea, sinus pressure, sore throat, trouble swallowing and voice change.   Eyes: Negative.   Respiratory: Negative.  Negative for apnea, cough, choking, chest tightness, shortness of breath, wheezing and stridor.   Cardiovascular: Negative.  Negative for chest pain, palpitations and leg swelling.  Gastrointestinal: Negative.  Negative for abdominal distention,  abdominal pain, nausea and vomiting.  Genitourinary: Negative.   Musculoskeletal: Negative.  Negative for arthralgias and myalgias.  Skin: Negative.  Negative for rash.  Allergic/Immunologic: Negative.  Negative for environmental allergies and food allergies.  Neurological: Negative.  Negative for dizziness, syncope, weakness and headaches.  Hematological: Negative.  Negative for adenopathy. Does not bruise/bleed easily.  Psychiatric/Behavioral: Negative.  Negative for agitation and sleep disturbance. The patient is not nervous/anxious.       Objective:    Physical Exam Vitals reviewed.  Constitutional:      Appearance: Normal appearance. He is well-developed. He is not diaphoretic.  HENT:     Head: Normocephalic and atraumatic.     Right Ear: Tympanic membrane, ear canal and external ear normal. There is no impacted cerumen.     Left Ear: Tympanic membrane, ear canal and external ear normal. There is no impacted cerumen.     Nose: Nose normal. No nasal deformity, septal deviation, mucosal edema or rhinorrhea.     Right Sinus: No maxillary sinus tenderness or frontal sinus tenderness.     Left Sinus: No maxillary sinus tenderness or frontal sinus tenderness.     Mouth/Throat:     Mouth: Mucous membranes are moist.     Pharynx: Oropharynx is clear. No oropharyngeal exudate.     Comments: Extremely poor dentition multiple carious teeth and periodontal disease Eyes:     General: No scleral icterus.    Conjunctiva/sclera: Conjunctivae normal.     Pupils: Pupils are equal, round, and reactive to light.  Neck:     Thyroid: No thyromegaly.     Vascular: No carotid bruit or JVD.     Trachea: Trachea normal. No tracheal tenderness or tracheal deviation.  Cardiovascular:     Rate and Rhythm: Normal rate and regular rhythm.     Chest Wall: PMI is not displaced.     Pulses: Normal pulses. No decreased pulses.     Heart sounds: Normal heart sounds, S1 normal and S2 normal. Heart sounds not  distant. No murmur heard.  No systolic murmur is present.     No diastolic murmur is present.     No friction rub. No gallop. No S3 or S4 sounds.  Pulmonary:     Effort: No tachypnea, accessory muscle usage or respiratory distress.     Breath sounds: No stridor. No decreased breath sounds, wheezing, rhonchi or rales.  Chest:     Chest wall: No tenderness.  Abdominal:     General: Bowel sounds are normal. There is no distension.     Palpations: Abdomen is soft. Abdomen is not rigid.     Tenderness: There is no abdominal tenderness. There is no guarding or rebound.  Musculoskeletal:        General: Normal range of motion.     Cervical back: Normal range of motion and neck supple. No edema, erythema or rigidity. No muscular tenderness. Normal range of motion.     Comments: Significant onychomycosis of toenails and a callus between the fifth and fourth toe involving the right foot and involving the fifth toe internally  Lymphadenopathy:     Head:     Right side of head: No submental or submandibular adenopathy.     Left side of head: No submental or submandibular adenopathy.     Cervical: No cervical adenopathy.  Skin:    General: Skin is warm and dry.     Coloration: Skin is not pale.     Findings: No rash.     Nails: There is no clubbing.  Neurological:     General: No focal deficit present.     Mental Status: He is alert and oriented to person, place, and time. Mental status is at baseline.     Sensory: No sensory deficit.  Psychiatric:        Attention and Perception: Attention and perception normal.        Mood and Affect: Affect normal. Mood is anxious.        Speech: Speech normal.        Behavior: Behavior normal. Behavior is cooperative.        Thought Content: Thought content normal.        Cognition and Memory: Cognition and memory normal.        Judgment: Judgment normal.     BP 123/76 Comment: 645AM home reading  Pulse (!) 49   Wt 186 lb 12.8 oz (84.7 kg)    SpO2 98%   BMI 23.35 kg/m  Wt Readings from Last 3 Encounters:  05/31/23 186 lb 12.8 oz (84.7 kg)  03/16/23 185 lb (83.9 kg)  02/14/23 185 lb (83.9 kg)     Health Maintenance Due  Topic Date Due   OPHTHALMOLOGY EXAM  02/19/2021   Colonoscopy  02/19/2023    There are no preventive care reminders to display for this patient.  Lab Results  Component Value Date   TSH 2.190 02/14/2023   Lab Results  Component Value Date   WBC 6.7 02/14/2023   HGB 15.3 02/14/2023   HCT 44.6 02/14/2023   MCV 89 02/14/2023   PLT 217 02/14/2023   Lab Results  Component Value Date   NA 141 02/14/2023   K 4.5 02/14/2023   CO2 24 08/25/2021   GLUCOSE 151 (H) 02/14/2023   BUN 15 02/14/2023   CREATININE 0.76 02/14/2023   BILITOT 0.7 02/14/2023   ALKPHOS 86 02/14/2023   AST 13 02/14/2023   ALT 10 08/25/2021   PROT 6.6 02/14/2023   ALBUMIN 4.7 02/14/2023   CALCIUM 9.8  02/14/2023   ANIONGAP 13 10/30/2019   EGFR 97 02/14/2023   Lab Results  Component Value Date   CHOL 121 10/12/2022   Lab Results  Component Value Date   HDL 36 (L) 10/12/2022   Lab Results  Component Value Date   LDLCALC 66 10/12/2022   Lab Results  Component Value Date   TRIG 103 10/12/2022   Lab Results  Component Value Date   CHOLHDL 3.4 10/12/2022   Lab Results  Component Value Date   HGBA1C 5.8 05/31/2023      Assessment & Plan:   Problem List Items Addressed This Visit       Cardiovascular and Mediastinum   PAF (paroxysmal atrial fibrillation) (HCC)    Currently remains in sinus rhythm continue low-dose metoprolol and apixaban      Relevant Medications   apixaban (ELIQUIS) 5 MG TABS tablet   atorvastatin (LIPITOR) 10 MG tablet   metoprolol tartrate (LOPRESSOR) 25 MG tablet     Digestive   Periodontal disease    Has yet to see dental        Endocrine   Type 2 diabetes mellitus, controlled (HCC) - Primary    A1c at goal continue current medication and needs diabetic eye exam referred  to [facility]      Relevant Medications   BD PEN NEEDLE NANO 2ND GEN 32G X 4 MM MISC   atorvastatin (LIPITOR) 10 MG tablet   metFORMIN (GLUCOPHAGE) 1000 MG tablet   insulin degludec (TRESIBA FLEXTOUCH) 100 UNIT/ML FlexTouch Pen   Other Relevant Orders   POCT glucose (manual entry) (Completed)   POCT glycosylated hemoglobin (Hb A1C) (Completed)   Ambulatory referral to Optometry   Ambulatory referral to Podiatry   Hyperlipidemia associated with type 2 diabetes mellitus (HCC)    Continue atorvastatin reassess lipids      Relevant Medications   apixaban (ELIQUIS) 5 MG TABS tablet   atorvastatin (LIPITOR) 10 MG tablet   metFORMIN (GLUCOPHAGE) 1000 MG tablet   metoprolol tartrate (LOPRESSOR) 25 MG tablet   insulin degludec (TRESIBA FLEXTOUCH) 100 UNIT/ML FlexTouch Pen   Other Relevant Orders   Lipid panel     Musculoskeletal and Integument   Onychomycosis    Needs repeat podiatry referral will refer to foot and ankle      Other Visit Diagnoses     Polyp of colon, unspecified part of colon, unspecified type       Relevant Orders   Ambulatory referral to Gastroenterology   Foot callus       Relevant Orders   Ambulatory referral to Podiatry      Meds ordered this encounter  Medications   BD PEN NEEDLE NANO 2ND GEN 32G X 4 MM MISC    Sig: Use with [place]    Dispense:  100 each    Refill:  1   apixaban (ELIQUIS) 5 MG TABS tablet    Sig: Take 1 tablet (5 mg total) by mouth 2 (two) times daily.    Dispense:  180 tablet    Refill:  1   atorvastatin (LIPITOR) 10 MG tablet    Sig: Take 1 tablet (10 mg total) by mouth daily.    Dispense:  90 tablet    Refill:  1   metFORMIN (GLUCOPHAGE) 1000 MG tablet    Sig: Take 1 tablet (1,000 mg total) by mouth 2 (two) times daily with a meal.    Dispense:  180 tablet    Refill:  1  metoprolol tartrate (LOPRESSOR) 25 MG tablet    Sig: Take 0.5 tablets (12.5 mg total) by mouth daily.    Dispense:  45 tablet    Refill:  1    insulin degludec (TRESIBA FLEXTOUCH) 100 UNIT/ML FlexTouch Pen    Sig: Inject 10 Units into the skin daily.    Dispense:  15 mL    Refill:  3    Follow-up: Return in about 4 months (around 10/01/2023) for htn, diabetes.    Shan Levans, MD

## 2023-05-31 NOTE — Assessment & Plan Note (Addendum)
A1c at goal continue current medication and needs diabetic eye exam referred to [facility]

## 2023-05-31 NOTE — Assessment & Plan Note (Signed)
Continue atorvastatin reassess lipids ?

## 2023-05-31 NOTE — Assessment & Plan Note (Signed)
Needs repeat podiatry referral will refer to foot and ankle

## 2023-06-01 ENCOUNTER — Telehealth: Payer: Self-pay

## 2023-06-01 LAB — LIPID PANEL
Chol/HDL Ratio: 2.8 ratio (ref 0.0–5.0)
Cholesterol, Total: 115 mg/dL (ref 100–199)
HDL: 41 mg/dL (ref >39)
LDL Chol Calc (NIH): 50 mg/dL (ref 0–99)
Triglycerides: 135 mg/dL (ref 0–149)
VLDL Cholesterol Cal: 24 mg/dL (ref 5–40)

## 2023-06-01 NOTE — Telephone Encounter (Signed)
Pt was called and vm was left, Information has been sent to nurse pool.   

## 2023-06-01 NOTE — Telephone Encounter (Signed)
-----   Message from Storm Frisk, MD sent at 06/01/2023  5:46 AM EDT ----- Let pt know cholesterol is normal stay on atorvastatin

## 2023-06-01 NOTE — Progress Notes (Signed)
Let pt know cholesterol is normal stay on atorvastatin

## 2023-06-13 ENCOUNTER — Other Ambulatory Visit: Payer: Self-pay | Admitting: Critical Care Medicine

## 2023-06-13 ENCOUNTER — Telehealth: Payer: Self-pay | Admitting: Critical Care Medicine

## 2023-06-13 DIAGNOSIS — E1165 Type 2 diabetes mellitus with hyperglycemia: Secondary | ICD-10-CM

## 2023-06-13 MED ORDER — ONETOUCH DELICA LANCETS 33G MISC: 11 refills | Status: DC

## 2023-06-13 MED ORDER — ONETOUCH VERIO VI STRP: ORAL_STRIP | 11 refills | Status: DC

## 2023-06-13 NOTE — Telephone Encounter (Signed)
I spoke to patient about referrals being declined by [facility] and [facility]  He declined a [facility] to [facility] and said he declined further screening  He wants an Eye ref to an MD  ref sent to [facility]

## 2023-06-13 NOTE — Progress Notes (Signed)
DM testing supply refills

## 2023-06-15 ENCOUNTER — Telehealth: Payer: Self-pay | Admitting: Critical Care Medicine

## 2023-06-15 DIAGNOSIS — Z1211 Encounter for screening for malignant neoplasm of colon: Secondary | ICD-10-CM

## 2023-06-15 NOTE — Telephone Encounter (Signed)
Copied from CRM [id number]. Topic: General - Other >> Jun 15, 2023  3:07 PM Santiya F wrote: Reason for CRM: Pt's wife is calling in requesting to speak with Dr. Lynelle Doctor nurse regarding the pt. Please follow up

## 2023-06-17 ENCOUNTER — Telehealth: Payer: Self-pay

## 2023-06-17 NOTE — Telephone Encounter (Signed)
Copied from CRM [id number]. Topic: Referral - Request for Referral >> Jun 17, 2023 10:07 AM Dondra Prader A wrote: Reason for CRM: Colonoscopy   Has patient seen PCP for this complaint? Yes.   *If NO, is insurance requiring patient see PCP for this issue before PCP can refer them? Referral for which specialty: Pt is requiring a Colonoscopy  Preferred provider/office: [facility] [address] [place], [place], [place] [address] [phone] Reason for referral: Pt has spoken with PCP regarding his colonoscopy and was trying to figure out where to go. Pt wife did call [facility] and was told on the referral it will need to say transferring care to [facility] so he wont have 2 gastro doctors.

## 2023-06-17 NOTE — Addendum Note (Signed)
Addended by: Shan Levans E on: 06/17/2023 12:27 PM   Modules accepted: Orders

## 2023-06-17 NOTE — Telephone Encounter (Signed)
Called patients wife (who is on dpr) stated that she found a GI doctor who will take him for his colonoscopy.   The clinic (per pts wife) is request a new referral to them.   [facility]  [address] [place], [place], [place] [address]

## 2023-06-17 NOTE — Telephone Encounter (Signed)
Called patient and wife and left voicemail

## 2023-06-17 NOTE — Telephone Encounter (Signed)
Ref to GI made

## 2023-06-20 NOTE — Telephone Encounter (Signed)
Noted  

## 2023-08-31 ENCOUNTER — Other Ambulatory Visit: Payer: Self-pay | Admitting: Critical Care Medicine

## 2023-08-31 DIAGNOSIS — I480 Paroxysmal atrial fibrillation: Secondary | ICD-10-CM

## 2023-09-01 NOTE — Telephone Encounter (Signed)
Request is too soon for refill. Last refill 05/31/23 for 90 and 1 refill.  Requested Prescriptions  Pending Prescriptions Disp Refills   ELIQUIS 5 MG TABS tablet [Pharmacy Med Name: ELIQUIS 5 MG TABLET] 180 tablet 1    Sig: TAKE 1 TABLET BY MOUTH TWICE A DAY     Hematology:  Anticoagulants - apixaban Failed - 08/31/2023 11:53 AM      Failed - ALT in normal range and within 360 days    ALT  Date Value Ref Range Status  08/25/2021 10 0 - 44 IU/L Final         Passed - PLT in normal range and within 360 days    Platelets  Date Value Ref Range Status  02/14/2023 217 150 - 450 x10E3/uL Final         Passed - HGB in normal range and within 360 days    Hemoglobin  Date Value Ref Range Status  02/14/2023 15.3 13.0 - 17.7 g/dL Final         Passed - HCT in normal range and within 360 days    Hematocrit  Date Value Ref Range Status  02/14/2023 44.6 37.5 - 51.0 % Final         Passed - Cr in normal range and within 360 days    Creatinine, Ser  Date Value Ref Range Status  02/14/2023 0.76 0.76 - 1.27 mg/dL Final         Passed - AST in normal range and within 360 days    AST  Date Value Ref Range Status  02/14/2023 13 0 - 40 IU/L Final         Passed - Valid encounter within last 12 months    Recent Outpatient Visits           3 months ago Controlled type 2 diabetes mellitus with hyperglycemia, with long-term current use of insulin (HCC)   [place] [facility] Storm Frisk, MD   9 months ago Controlled type 2 diabetes mellitus with hyperglycemia, with long-term current use of insulin [facility])   [place] [facility] Storm Frisk, MD   1 year ago Controlled type 2 diabetes mellitus with hyperglycemia, with long-term current use of insulin [facility])   [place] [facility] Storm Frisk, MD   2 years ago Controlled type 2 diabetes mellitus with hyperglycemia, with long-term current use of insulin  [facility])   [facility] Storm Frisk, MD   2 years ago Controlled type 2 diabetes mellitus with hyperglycemia, with long-term current use of insulin [facility])   [place] [facility] Storm Frisk, MD       Future Appointments             In 1 month Delford Field Charlcie Cradle, MD [facility]

## 2023-09-09 ENCOUNTER — Other Ambulatory Visit: Payer: Self-pay | Admitting: Critical Care Medicine

## 2023-09-09 DIAGNOSIS — E1165 Type 2 diabetes mellitus with hyperglycemia: Secondary | ICD-10-CM

## 2023-09-09 MED ORDER — ONETOUCH DELICA LANCETS 33G MISC: 6 refills | Status: DC

## 2023-09-09 MED ORDER — ONETOUCH VERIO VI STRP: ORAL_STRIP | 6 refills | Status: DC

## 2023-09-09 NOTE — Telephone Encounter (Signed)
Requested Prescriptions  Pending Prescriptions Disp Refills   glucose blood (ONETOUCH VERIO) test strip 100 each 6    Sig: Use to check blood sugar once daily. Dx E11.65     Endocrinology: Diabetes - Testing Supplies Passed - 09/09/2023  9:46 AM      Passed - Valid encounter within last 12 months    Recent Outpatient Visits           3 months ago Controlled type 2 diabetes mellitus with hyperglycemia, with long-term current use of insulin [facility])   [place] [facility] Storm Frisk, MD   9 months ago Controlled type 2 diabetes mellitus with hyperglycemia, with long-term current use of insulin [facility])   [place] Storm Frisk, MD   1 year ago Controlled type 2 diabetes mellitus with hyperglycemia, with long-term current use of insulin [facility])   [place] [facility] Storm Frisk, MD   2 years ago Controlled type 2 diabetes mellitus with hyperglycemia, with long-term current use of insulin [facility])   [place] [facility] Storm Frisk, MD   2 years ago Controlled type 2 diabetes mellitus with hyperglycemia, with long-term current use of insulin [facility])   [place] [facility] Storm Frisk, MD       Future Appointments             In 1 month Storm Frisk, MD [facility]             OneTouch Delica Lancets 33G MISC 100 each 6    Sig: Use to check blood sugar once daily. Dx E11.65     Endocrinology: Diabetes - Testing Supplies Passed - 09/09/2023  9:46 AM      Passed - Valid encounter within last 12 months    Recent Outpatient Visits           3 months ago Controlled type 2 diabetes mellitus with hyperglycemia, with long-term current use of insulin [facility])   [place] [facility] Storm Frisk, MD   9 months ago Controlled type 2 diabetes mellitus with  hyperglycemia, with long-term current use of insulin [facility])   [place] [facility] Storm Frisk, MD   1 year ago Controlled type 2 diabetes mellitus with hyperglycemia, with long-term current use of insulin [facility])   [place] [facility] Storm Frisk, MD   2 years ago Controlled type 2 diabetes mellitus with hyperglycemia, with long-term current use of insulin [facility])   [place] [facility] Storm Frisk, MD   2 years ago Controlled type 2 diabetes mellitus with hyperglycemia, with long-term current use of insulin [facility])   [place] [facility] Storm Frisk, MD       Future Appointments             In 1 month Delford Field Charlcie Cradle, MD [facility]

## 2023-09-09 NOTE — Telephone Encounter (Signed)
Medication Refill - Medication: glucose blood (ONETOUCH VERIO) test strip [[id number]] OneTouch Delica Lancets 33G MISC [[id number]]   Has the patient contacted their pharmacy? Yes.     (Agent: If yes, when and what did the pharmacy advise?) Contact PCP   Preferred Pharmacy (with phone number [facility] street [facility] [address]   Has the patient been seen for an appointment in the last year OR does the patient have an upcoming appointment? Yes.    Agent: Please be advised that RX refills may take up to 3 business days. We ask that you follow-up with your pharmacy.   Pt's wife says if the refill can't be put in to please call her to let her know so she can figure out what to do.

## 2023-10-24 NOTE — Progress Notes (Unsigned)
Established Patient Office Visit  Subjective:  Patient ID: Cameron Arroyo, male    DOB: 02-Jan-1951  Age: 72 y.o. MRN: [id number]  CC:  No chief complaint on file.   HPI 01/25/22 Cameron Arroyo presents for diabetes follow-up.  The patient brings with him a diary blood pressures are in the 100-110/70-80 range at home blood sugars fasting in the 100 range he never has a blood sugar less than 70 and postprandials in the 140 range.  Blood sugar on arrival 101 and blood pressure 114/71.  Patient tends to get up at 7:30 AM and will have a light breakfast at 9:30 AM which includes oatmeal he will have a light snack at 10:30 AM then he will have lunch at noon the wife says occasionally is just popcorn he sometimes skips dinner sometimes he will have a midafternoon dinner which will include ham butter beans and sweet potatoes he does not eat much in the way of salads and does not exercise.  Wife said he sits and watches TV all day and is very hard of hearing.  He has had no tachycardia or issues with his blood pressure or his heart.  He is having no bleeding on Eliquis.  Today the patient needs an A1c and a urine microalbumin he does not need any other labs at this visit.  The patient did declined to receive the pneumococcal vaccine.  He also does not wish to receive the shingles vaccine.  A1C 6.2 this visit   11/30/22  Patient seen in return follow-up has not been seen since February of last year but did go [facility] in November for pustular lesion on the back of the neck.  This resolved with antibiotics alone and did self drain. The patient has not had recent tachycardia.  Blood pressure today is 121/73 pulse is 65.  Patient now has new insurance and wants his glucometer from [place] we tried to send a prescription in December but apparently did not go through we will need to have our pharmacist follow-up on this.  He needs refills on his medications and [facility].  His wife is with him and she states he only eats 1 meal a day will not eat after 3 PM.  She states he can be very cross with her and he stays at home all the time sits in his chair watches TV all day.  Patient brings his recordings with him of his data and his blood pressure has been anywhere from 110s to 120/73 continuously blood sugars have been excellent and A1c today is 6.2 which is unchanged from February 2023. Patient has poor dentition but declines to see a dentist at this time.  Patient reminded he needs an eye exam this calendar year.  He also needs follow-up with cardiology.  05/31/23 Patient seen in follow-up brings his diary with him blood pressures have been in the 120/75 range.  His A1c on arrival 5.8 blood sugars have been good today is 102.  He remains compliant with the [place] and metformin.  Patient maintains low-dose metoprolol for paroxysmal atrial fibrillation and apixaban.  Maintains low-dose atorvastatin for hyperlipidemia.  He is complaining that he is on a One Touch system now this is prescribed by the insurance.  Patient would benefit from foot exam.  He needs an eye exam and repeat colonoscopy.  He was not satisfied with the prior provider of his colonoscopy.  He does have a  precancerous polyp in the colon that needs to be rechecked in 3 years.  He will agree to see a second opinion   10/26/23  Past Medical History:  Diagnosis Date   Arthritis    Asthma    as child   Atrial flutter with rapid ventricular response (HCC) 08/20/2019   Lumbar disc herniation 11/01/2019   New onset type 2 diabetes mellitus (HCC) 08/20/2019   Pneumonia    history of    Past Surgical History:  Procedure Laterality Date   ANTERIOR CERVICAL DECOMP/DISCECTOMY FUSION N/A 03/01/2013   Procedure: Cervical Four-Five Cervical Five-Six Cervical Six-Seven anterior cervical decompression with fusion plating and bonegraft;  Surgeon: Hewitt Shorts, MD;  Location: [facility];  Service:  Neurosurgery;  Laterality: N/A;  ANTERIOR CERVICAL DECOMPRESSION/DISCECTOMY FUSION 3 LEVELS   CARDIOVERSION N/A 08/22/2019   Procedure: CARDIOVERSION;  Surgeon: Little Ishikawa, MD;  Location: [facility] ENDOSCOPY;  Service: Endoscopy;  Laterality: N/A;   TEE WITHOUT CARDIOVERSION N/A 08/22/2019   Procedure: TRANSESOPHAGEAL ECHOCARDIOGRAM (TEE);  Surgeon: Little Ishikawa, MD;  Location[facility];  Service: Endoscopy;  Laterality: N/A;    Family History  Problem Relation Age of Onset   Cirrhosis Mother    Cancer Father     Social History   Socioeconomic History   Marital status: Married    Spouse name: Not on file   Number of children: Not on file   Years of education: Not on file   Highest education level: Not on file  Occupational History   Not on file  Tobacco Use   Smoking status: Former    Current packs/day: 0.00    Types: Cigarettes    Quit date: 11/29/1992    Years since quitting: 30.9   Smokeless tobacco: Never  Vaping Use   Vaping status: Never Used  Substance and Sexual Activity   Alcohol use: No   Drug use: Never   Sexual activity: Not Currently  Other Topics Concern   Not on file  Social History Narrative   Not on file   Social Determinants of Health   Financial Resource Strain: Low Risk  (03/16/2023)   Overall Financial Resource Strain (CARDIA)    Difficulty of Paying Living Expenses: Not hard at all  Food Insecurity: No Food Insecurity (03/16/2023)   Hunger Vital Sign    Worried About Running Out of Food in the Last Year: Never true    Ran Out of Food in the Last Year: Never true  Transportation Needs: No Transportation Needs (03/16/2023)   PRAPARE - Administrator, Civil Service (Medical): No    Lack of Transportation (Non-Medical): No  Physical Activity: Inactive (03/16/2023)   Exercise Vital Sign    Days of Exercise per Week: 0 days    Minutes of Exercise per Session: 0 min  Stress: No Stress Concern Present (03/16/2023)    [employer] of Occupational Health - Occupational Stress Questionnaire    Feeling of Stress : Not at all  Social Connections: Not on file  Intimate Partner Violence: Not on file    Outpatient Medications Prior to Visit  Medication Sig Dispense Refill   apixaban (ELIQUIS) 5 MG TABS tablet Take 1 tablet (5 mg total) by mouth 2 (two) times daily. 180 tablet 1   atorvastatin (LIPITOR) 10 MG tablet Take 1 tablet (10 mg total) by mouth daily. 90 tablet 1   BD PEN NEEDLE NANO 2ND GEN 32G X 4 MM MISC Use with Tresiba 100 each 1  Blood Glucose Monitoring Suppl (ONETOUCH VERIO REFLECT) w/Device KIT Use to check blood sugar once daily. Dx E11.65 1 kit 0   Blood Glucose Monitoring Suppl (ONETOUCH VERIO) w/Device KIT Use to check blood sugar once daily. Dx E11.65 1 kit 0   glucose blood (ONETOUCH VERIO) test strip Use to check blood sugar once daily. Dx E11.65 100 each 6   insulin degludec (TRESIBA FLEXTOUCH) 100 UNIT/ML FlexTouch Pen Inject 10 Units into the skin daily. 15 mL 3   metFORMIN (GLUCOPHAGE) 1000 MG tablet Take 1 tablet (1,000 mg total) by mouth 2 (two) times daily with a meal. 180 tablet 1   metoprolol tartrate (LOPRESSOR) 25 MG tablet Take 0.5 tablets (12.5 mg total) by mouth daily. 45 tablet 1   OneTouch Delica Lancets 33G MISC Use to check blood sugar once daily. Dx E11.65 100 each 6   No facility-administered medications prior to visit.    No Known Allergies  ROS Review of Systems  Constitutional: Negative.   HENT:  Positive for dental problem and hearing loss. Negative for ear pain, postnasal drip, rhinorrhea, sinus pressure, sore throat, trouble swallowing and voice change.   Eyes: Negative.   Respiratory: Negative.  Negative for apnea, cough, choking, chest tightness, shortness of breath, wheezing and stridor.   Cardiovascular: Negative.  Negative for chest pain, palpitations and leg swelling.  Gastrointestinal: Negative.  Negative for abdominal distention, abdominal  pain, nausea and vomiting.  Genitourinary: Negative.   Musculoskeletal: Negative.  Negative for arthralgias and myalgias.  Skin: Negative.  Negative for rash.  Allergic/Immunologic: Negative.  Negative for environmental allergies and food allergies.  Neurological: Negative.  Negative for dizziness, syncope, weakness and headaches.  Hematological: Negative.  Negative for adenopathy. Does not bruise/bleed easily.  Psychiatric/Behavioral: Negative.  Negative for agitation and sleep disturbance. The patient is not nervous/anxious.       Objective:    Physical Exam Vitals reviewed.  Constitutional:      Appearance: Normal appearance. He is well-developed. He is not diaphoretic.  HENT:     Head: Normocephalic and atraumatic.     Right Ear: Tympanic membrane, ear canal and external ear normal. There is no impacted cerumen.     Left Ear: Tympanic membrane, ear canal and external ear normal. There is no impacted cerumen.     Nose: Nose normal. No nasal deformity, septal deviation, mucosal edema or rhinorrhea.     Right Sinus: No maxillary sinus tenderness or frontal sinus tenderness.     Left Sinus: No maxillary sinus tenderness or frontal sinus tenderness.     Mouth/Throat:     Mouth: Mucous membranes are moist.     Pharynx: Oropharynx is clear. No oropharyngeal exudate.     Comments: Extremely poor dentition multiple carious teeth and periodontal disease Eyes:     General: No scleral icterus.    Conjunctiva/sclera: Conjunctivae normal.     Pupils: Pupils are equal, round, and reactive to light.  Neck:     Thyroid: No thyromegaly.     Vascular: No carotid bruit or JVD.     Trachea: Trachea normal. No tracheal tenderness or tracheal deviation.  Cardiovascular:     Rate and Rhythm: Normal rate and regular rhythm.     Chest Wall: PMI is not displaced.     Pulses: Normal pulses. No decreased pulses.     Heart sounds: Normal heart sounds, S1 normal and S2 normal. Heart sounds not distant.  No murmur heard.    No systolic murmur is present.  No diastolic murmur is present.     No friction rub. No gallop. No S3 or S4 sounds.  Pulmonary:     Effort: No tachypnea, accessory muscle usage or respiratory distress.     Breath sounds: No stridor. No decreased breath sounds, wheezing, rhonchi or rales.  Chest:     Chest wall: No tenderness.  Abdominal:     General: Bowel sounds are normal. There is no distension.     Palpations: Abdomen is soft. Abdomen is not rigid.     Tenderness: There is no abdominal tenderness. There is no guarding or rebound.  Musculoskeletal:        General: Normal range of motion.     Cervical back: Normal range of motion and neck supple. No edema, erythema or rigidity. No muscular tenderness. Normal range of motion.     Comments: Significant onychomycosis of toenails and a callus between the fifth and fourth toe involving the right foot and involving the fifth toe internally  Lymphadenopathy:     Head:     Right side of head: No submental or submandibular adenopathy.     Left side of head: No submental or submandibular adenopathy.     Cervical: No cervical adenopathy.  Skin:    General: Skin is warm and dry.     Coloration: Skin is not pale.     Findings: No rash.     Nails: There is no clubbing.  Neurological:     General: No focal deficit present.     Mental Status: He is alert and oriented to person, place, and time. Mental status is at baseline.     Sensory: No sensory deficit.  Psychiatric:        Attention and Perception: Attention and perception normal.        Mood and Affect: Affect normal. Mood is anxious.        Speech: Speech normal.        Behavior: Behavior normal. Behavior is cooperative.        Thought Content: Thought content normal.        Cognition and Memory: Cognition and memory normal.        Judgment: Judgment normal.     There were no vitals taken for this visit. Wt Readings from Last 3 Encounters:  05/31/23 186 lb  12.8 oz (84.7 kg)  03/16/23 185 lb (83.9 kg)  02/14/23 185 lb (83.9 kg)     Health Maintenance Due  Topic Date Due   OPHTHALMOLOGY EXAM  02/19/2021   Colonoscopy  02/19/2023   Diabetic kidney evaluation - Urine ACR  10/13/2023    There are no preventive care reminders to display for this patient.  Lab Results  Component Value Date   TSH 2.190 02/14/2023   Lab Results  Component Value Date   WBC 6.7 02/14/2023   HGB 15.3 02/14/2023   HCT 44.6 02/14/2023   MCV 89 02/14/2023   PLT 217 02/14/2023   Lab Results  Component Value Date   NA 141 02/14/2023   K 4.5 02/14/2023   CO2 24 08/25/2021   GLUCOSE 151 (H) 02/14/2023   BUN 15 02/14/2023   CREATININE 0.76 02/14/2023   BILITOT 0.7 02/14/2023   ALKPHOS 86 02/14/2023   AST 13 02/14/2023   ALT 10 08/25/2021   PROT 6.6 02/14/2023   ALBUMIN 4.7 02/14/2023   CALCIUM 9.8 02/14/2023   ANIONGAP 13 10/30/2019   EGFR 97 02/14/2023   Lab Results  Component Value Date  CHOL 115 05/31/2023   Lab Results  Component Value Date   HDL 41 05/31/2023   Lab Results  Component Value Date   LDLCALC 50 05/31/2023   Lab Results  Component Value Date   TRIG 135 05/31/2023   Lab Results  Component Value Date   CHOLHDL 2.8 05/31/2023   Lab Results  Component Value Date   HGBA1C 5.8 05/31/2023      Assessment & Plan:   Problem List Items Addressed This Visit   None   No orders of the defined types were placed in this encounter.   Follow-up: No follow-ups on file.    Shan Levans, MD

## 2023-10-25 ENCOUNTER — Ambulatory Visit: Payer: Medicare HMO | Admitting: Critical Care Medicine

## 2023-10-26 ENCOUNTER — Encounter: Payer: Self-pay | Admitting: Critical Care Medicine

## 2023-10-26 ENCOUNTER — Ambulatory Visit: Payer: Medicare HMO | Attending: Critical Care Medicine | Admitting: Critical Care Medicine

## 2023-10-26 VITALS — BP 137/75 | HR 60 | Temp 97.9°F | Ht 75.0 in | Wt 190.0 lb

## 2023-10-26 DIAGNOSIS — M4716 Other spondylosis with myelopathy, lumbar region: Secondary | ICD-10-CM

## 2023-10-26 DIAGNOSIS — K056 Periodontal disease, unspecified: Secondary | ICD-10-CM

## 2023-10-26 DIAGNOSIS — E1169 Type 2 diabetes mellitus with other specified complication: Secondary | ICD-10-CM | POA: Diagnosis not present

## 2023-10-26 DIAGNOSIS — Z7901 Long term (current) use of anticoagulants: Secondary | ICD-10-CM

## 2023-10-26 DIAGNOSIS — Z981 Arthrodesis status: Secondary | ICD-10-CM

## 2023-10-26 DIAGNOSIS — E1165 Type 2 diabetes mellitus with hyperglycemia: Secondary | ICD-10-CM | POA: Diagnosis not present

## 2023-10-26 DIAGNOSIS — E785 Hyperlipidemia, unspecified: Secondary | ICD-10-CM

## 2023-10-26 DIAGNOSIS — I480 Paroxysmal atrial fibrillation: Secondary | ICD-10-CM

## 2023-10-26 DIAGNOSIS — Z794 Long term (current) use of insulin: Secondary | ICD-10-CM

## 2023-10-26 DIAGNOSIS — Z7984 Long term (current) use of oral hypoglycemic drugs: Secondary | ICD-10-CM

## 2023-10-26 DIAGNOSIS — H9193 Unspecified hearing loss, bilateral: Secondary | ICD-10-CM

## 2023-10-26 DIAGNOSIS — E119 Type 2 diabetes mellitus without complications: Secondary | ICD-10-CM

## 2023-10-26 DIAGNOSIS — B351 Tinea unguium: Secondary | ICD-10-CM

## 2023-10-26 MED ORDER — METFORMIN HCL 1000 MG PO TABS: 1000.0000 mg | ORAL_TABLET | Freq: Two times a day (BID) | ORAL | 2 refills | Status: AC

## 2023-10-26 MED ORDER — ONETOUCH VERIO VI STRP
ORAL_STRIP | 6 refills | Status: DC
Start: 2023-10-26 — End: 2024-01-31

## 2023-10-26 MED ORDER — TRESIBA FLEXTOUCH 100 UNIT/ML ~~LOC~~ SOPN: 10.0000 [IU] | PEN_INJECTOR | Freq: Every day | SUBCUTANEOUS | 4 refills | Status: AC

## 2023-10-26 MED ORDER — ONETOUCH DELICA LANCETS 33G MISC
6 refills | Status: DC
Start: 2023-10-26 — End: 2024-01-31

## 2023-10-26 MED ORDER — APIXABAN 5 MG PO TABS: 5.0000 mg | ORAL_TABLET | Freq: Two times a day (BID) | ORAL | 2 refills | Status: AC

## 2023-10-26 MED ORDER — ATORVASTATIN CALCIUM 10 MG PO TABS: 10.0000 mg | ORAL_TABLET | Freq: Every day | ORAL | 3 refills | Status: AC

## 2023-10-26 MED ORDER — METOPROLOL TARTRATE 25 MG PO TABS: 12.5000 mg | ORAL_TABLET | Freq: Every day | ORAL | 3 refills | Status: AC

## 2023-10-26 NOTE — Assessment & Plan Note (Signed)
Stable at this time continue low-dose metoprolol and apixaban and he knows to call cardiology if he notes progressive or increased palpitations he was also given recommendations on when to hold apixaban holding it 3 days before the procedure and resuming when gastroenterology clears him post procedure will check CBC

## 2023-10-26 NOTE — Assessment & Plan Note (Signed)
Check CBC 

## 2023-10-26 NOTE — Patient Instructions (Signed)
Referral sent to endocrinology Referral sent to [facility] Internal medicine Labs today All medications refilled Our practice will cover your care until you get in with new MD Hold blood thinner three days before colonoscopy and resume based on stomach doctor recommendations  Call for any problems until you establish with your new primary care

## 2023-10-26 NOTE — Assessment & Plan Note (Signed)
Lipids were normal when assessed in July continue with current dose of atorvastatin

## 2023-10-26 NOTE — Assessment & Plan Note (Addendum)
Diabetes is well-controlled plan is to maintain low-dose insulin and metformin.  Referral to endocrinology was made.  Referral to a new primary care internist made at the [facility]

## 2023-10-28 LAB — CBC WITH DIFFERENTIAL/PLATELET
Basophils Absolute: 0 10*3/uL (ref 0.0–0.2)
Basos: 1 %
EOS (ABSOLUTE): 0.1 10*3/uL (ref 0.0–0.4)
Eos: 1 %
Hematocrit: 45.2 % (ref 37.5–51.0)
Hemoglobin: 15.2 g/dL (ref 13.0–17.7)
Immature Grans (Abs): 0 10*3/uL (ref 0.0–0.1)
Immature Granulocytes: 0 %
Lymphocytes Absolute: 1.7 10*3/uL (ref 0.7–3.1)
Lymphs: 25 %
MCH: 31.1 pg (ref 26.6–33.0)
MCHC: 33.6 g/dL (ref 31.5–35.7)
MCV: 93 fL (ref 79–97)
Monocytes Absolute: 0.6 10*3/uL (ref 0.1–0.9)
Monocytes: 9 %
Neutrophils Absolute: 4.2 10*3/uL (ref 1.4–7.0)
Neutrophils: 64 %
Platelets: 226 10*3/uL (ref 150–450)
RBC: 4.88 x10E6/uL (ref 4.14–5.80)
RDW: 12.5 % (ref 11.6–15.4)
WBC: 6.6 10*3/uL (ref 3.4–10.8)

## 2023-10-28 LAB — RENAL FUNCTION PANEL
Albumin: 4.5 g/dL (ref 3.8–4.8)
BUN/Creatinine Ratio: 16 (ref 10–24)
BUN: 14 mg/dL (ref 8–27)
CO2: 16 mmol/L — ABNORMAL LOW (ref 20–29)
Calcium: 9.5 mg/dL (ref 8.6–10.2)
Chloride: 104 mmol/L (ref 96–106)
Creatinine, Ser: 0.85 mg/dL (ref 0.76–1.27)
Phosphorus: 3.7 mg/dL (ref 2.8–4.1)
Sodium: 142 mmol/L (ref 134–144)
eGFR: 93 mL/min/{1.73_m2} (ref >59)

## 2023-10-28 LAB — MICROALBUMIN / CREATININE URINE RATIO
Creatinine, Urine: 36.3 mg/dL
Microalb/Creat Ratio: <8 mg/g{creat} (ref 0–29)
Microalbumin, Urine: <3 ug/mL

## 2023-10-29 NOTE — Progress Notes (Signed)
Let pt know all labs normal  kidney normal  no protein in urine no kidney damage from diabetes  not anemic not bleeding from apixaban

## 2023-11-01 ENCOUNTER — Telehealth: Payer: Self-pay

## 2023-11-01 NOTE — Telephone Encounter (Signed)
-----   Message from Shan Levans sent at 10/29/2023  8:44 AM EST ----- Let pt know all labs normal  kidney normal  no protein in urine no kidney damage from diabetes  not anemic not bleeding from apixaban

## 2023-11-01 NOTE — Telephone Encounter (Signed)
Pt was called and is aware of results, DOB was confirmed.  ?

## 2023-11-03 NOTE — Telephone Encounter (Signed)
Called patient and he is aware of providers note

## 2023-12-07 ENCOUNTER — Telehealth: Payer: Self-pay | Admitting: Critical Care Medicine

## 2023-12-07 NOTE — Telephone Encounter (Signed)
DISCARD

## 2023-12-07 NOTE — Telephone Encounter (Signed)
 Referral Request - Did the patient discuss referral with their provider in the last year? Yes   Appointment offered? Yes  Type of order/referral and detailed reason for visit: Diabetes doesn't want to go to [facility] [address]  Preference of office, provider, location: Dr Stefano [facility] [address] [place] [place] [phone]   If referral order, have you been seen by this specialty before? No   Can we respond through MyChart? No

## 2024-01-02 ENCOUNTER — Encounter: Payer: Self-pay | Admitting: Gastroenterology

## 2024-01-02 ENCOUNTER — Other Ambulatory Visit: Payer: Self-pay | Admitting: Critical Care Medicine

## 2024-01-02 DIAGNOSIS — E1165 Type 2 diabetes mellitus with hyperglycemia: Secondary | ICD-10-CM

## 2024-01-08 NOTE — H&P (Signed)
 Pre-Procedure H&P   Patient ID: Cameron Arroyo is a 73 y.o. male.  Gastroenterology Provider: [name], DO  Referring Provider: Jamee Mazzoni, PA PCP: Alona Arrow, PA  Date: 01/09/2024  HPI [name] is a 73 y.o. male who presents today for Colonoscopy for Personal history of colon polyps .   Last underwent colonoscopy in March 2021 with [facility] demonstrating 3 adenomatous polyps and internal hemorrhoids EGD at that time demonstrated H. pylori negative gastritis.  He underwent empiric dilation for reported dysphagia.  He does have a history of C-spine fusion.  Patient is on Eliquis  which has been held for this procedure (Sunday evening)  Mother with a history of drug-induced liver injury and cirrhosis.  Also possibly with Crohn's  Hemoglobin 15.2 MCV 93 platelets 226,000 creatinine 0.76  Past Medical History:  Diagnosis Date   Arthritis    Asthma    as child   Atrial flutter with rapid ventricular response (HCC) 08/20/2019   Lumbar disc herniation 11/01/2019   New onset type 2 diabetes mellitus (HCC) 08/20/2019   Pneumonia    history of    Past Surgical History:  Procedure Laterality Date   ANTERIOR CERVICAL DECOMP/DISCECTOMY FUSION N/A 03/01/2013   Procedure: Cervical Four-Five Cervical Five-Six Cervical Six-Seven anterior cervical decompression with fusion plating and bonegraft;  Surgeon: Corrina Dimitri, MD;  Location: [facility];  Service: Neurosurgery;  Laterality: N/A;  ANTERIOR CERVICAL DECOMPRESSION/DISCECTOMY FUSION 3 LEVELS   CARDIOVERSION N/A 08/22/2019   Procedure: CARDIOVERSION;  Surgeon: Wendie Hamburg, MD;  Location: [facility] ENDOSCOPY;  Service: Endoscopy;  Laterality: N/A;   TEE WITHOUT CARDIOVERSION N/A 08/22/2019   Procedure: TRANSESOPHAGEAL ECHOCARDIOGRAM (TEE);  Surgeon: Wendie Hamburg, MD;  Location: [facility] ENDOSCOPY;  Service: Endoscopy;  Laterality: N/A;    Family History Mother with a history of drug-induced  liver injury and cirrhosis.  Also possibly with Crohn's No other h/o GI disease or malignancy  Review of Systems  Constitutional:  Negative for activity change, appetite change, chills, diaphoresis, fatigue, fever and unexpected weight change.  HENT:  Negative for trouble swallowing and voice change.   Respiratory:  Negative for shortness of breath and wheezing.   Cardiovascular:  Negative for chest pain, palpitations and leg swelling.  Gastrointestinal:  Negative for abdominal distention, abdominal pain, anal bleeding, blood in stool, constipation, diarrhea, nausea and vomiting.  Musculoskeletal:  Negative for arthralgias and myalgias.  Skin:  Negative for color change and pallor.  Neurological:  Negative for dizziness, syncope and weakness.  Psychiatric/Behavioral:  Negative for confusion. The patient is not nervous/anxious.   All other systems reviewed and are negative.    Medications No current facility-administered medications on file prior to encounter.   Current Outpatient Medications on File Prior to Encounter  Medication Sig Dispense Refill   atorvastatin  (LIPITOR) 10 MG tablet Take 1 tablet (10 mg total) by mouth daily. 90 tablet 3   insulin  degludec (TRESIBA  FLEXTOUCH) 100 UNIT/ML FlexTouch Pen Inject 10 Units into the skin daily. 15 mL 4   metoprolol  tartrate (LOPRESSOR ) 25 MG tablet Take 0.5 tablets (12.5 mg total) by mouth daily. 90 tablet 3   apixaban  (ELIQUIS ) 5 MG TABS tablet Take 1 tablet (5 mg total) by mouth 2 (two) times daily. 180 tablet 2   Blood Glucose Monitoring Suppl (ONETOUCH VERIO REFLECT) w/Device KIT Use to check blood sugar once daily. Dx E11.65 1 kit 0   Blood Glucose Monitoring Suppl (ONETOUCH VERIO) w/Device KIT Use to check  blood sugar once daily. Dx E11.65 1 kit 0   glucose blood (ONETOUCH VERIO) test strip Use to check blood sugar twice daily. Dx E11.65 200 each 6   metFORMIN  (GLUCOPHAGE ) 1000 MG tablet Take 1 tablet (1,000 mg total) by mouth 2 (two)  times daily with a meal. 360 tablet 2   OneTouch Delica Lancets 33G MISC Use to check blood sugar twice daily. Dx E11.65 200 each 6    Pertinent medications related to GI and procedure were reviewed by me with the patient prior to the procedure   Current Facility-Administered Medications:    0.9 %  sodium chloride  infusion, , Intravenous, Continuous, [name], DO, Last Rate: 20 mL/hr at 01/09/24 1209, New Bag at 01/09/24 1209  sodium chloride  20 mL/hr at 01/09/24 1209       No Known Allergies Allergies were reviewed by me prior to the procedure  Objective   Body mass index is 22.66 kg/m. Vitals:   01/09/24 1201  BP: 110/64  Pulse: (!) 107  Resp: 20  Temp: (!) 96.3 F (35.7 C)  TempSrc: Temporal  SpO2: 99%  Weight: 84.5 kg  Height: 6\' 4"  (1.93 m)     Physical Exam Vitals and nursing note reviewed.  Constitutional:      General: He is not in acute distress.    Appearance: Normal appearance. He is not ill-appearing, toxic-appearing or diaphoretic.  HENT:     Head: Normocephalic and atraumatic.     Nose: Nose normal.     Mouth/Throat:     Mouth: Mucous membranes are moist.     Pharynx: Oropharynx is clear.  Eyes:     General: No scleral icterus.    Extraocular Movements: Extraocular movements intact.  Cardiovascular:     Rate and Rhythm: Regular rhythm. Tachycardia present.     Heart sounds: Normal heart sounds. No murmur heard.    No friction rub. No gallop.  Pulmonary:     Effort: Pulmonary effort is normal. No respiratory distress.     Breath sounds: Normal breath sounds. No wheezing, rhonchi or rales.  Abdominal:     General: Bowel sounds are normal. There is no distension.     Palpations: Abdomen is soft.     Tenderness: There is no abdominal tenderness. There is no guarding or rebound.  Musculoskeletal:     Cervical back: Neck supple.     Right lower leg: No edema.     Left lower leg: No edema.  Skin:    General: Skin is warm and dry.      Coloration: Skin is not jaundiced or pale.     Comments: Telangiectasias of nose  Neurological:     General: No focal deficit present.     Mental Status: He is alert and oriented to person, place, and time. Mental status is at baseline.  Psychiatric:        Mood and Affect: Mood normal.        Behavior: Behavior normal.        Thought Content: Thought content normal.        Judgment: Judgment normal.      Assessment:  [name] is a 73 y.o. male  who presents today for Colonoscopy for Personal history of colon polyps .  Plan:  Colonoscopy with possible intervention today  Colonoscopy with possible biopsy, control of bleeding, polypectomy, and interventions as necessary has been discussed with the patient/patient representative. Informed consent was obtained from the patient/patient representative after  explaining the indication, nature, and risks of the procedure including but not limited to death, bleeding, perforation, missed neoplasm/lesions, cardiorespiratory compromise, and reaction to medications. Opportunity for questions was given and appropriate answers were provided. Patient/patient representative has verbalized understanding is amenable to undergoing the procedure.   [name], DO  [facility]  Portions of the record may have been created with voice recognition software. Occasional wrong-word or 'sound-a-like' substitutions may have occurred due to the inherent limitations of voice recognition software.  Read the chart carefully and recognize, using context, where substitutions may have occurred.

## 2024-01-09 ENCOUNTER — Other Ambulatory Visit: Payer: Self-pay

## 2024-01-09 ENCOUNTER — Encounter: Payer: Self-pay | Admitting: Gastroenterology

## 2024-01-09 ENCOUNTER — Encounter
Admission: RE | Disposition: A | Discharge status: Home / Self Care | Payer: Self-pay | Source: Home / Self Care | Attending: Gastroenterology

## 2024-01-09 ENCOUNTER — Ambulatory Visit
Admission: RE | Admit: 2024-01-09 | Discharge: 2024-01-09 | Disposition: A | Discharge status: Home / Self Care | Payer: Medicare HMO | Attending: Gastroenterology | Admitting: Gastroenterology

## 2024-01-09 ENCOUNTER — Ambulatory Visit: Payer: Medicare HMO | Admitting: Anesthesiology

## 2024-01-09 DIAGNOSIS — K635 Polyp of colon: Secondary | ICD-10-CM | POA: Insufficient documentation

## 2024-01-09 DIAGNOSIS — K649 Unspecified hemorrhoids: Secondary | ICD-10-CM | POA: Insufficient documentation

## 2024-01-09 DIAGNOSIS — D120 Benign neoplasm of cecum: Secondary | ICD-10-CM | POA: Diagnosis not present

## 2024-01-09 DIAGNOSIS — Z7984 Long term (current) use of oral hypoglycemic drugs: Secondary | ICD-10-CM | POA: Diagnosis not present

## 2024-01-09 DIAGNOSIS — E119 Type 2 diabetes mellitus without complications: Secondary | ICD-10-CM | POA: Diagnosis not present

## 2024-01-09 DIAGNOSIS — Z794 Long term (current) use of insulin: Secondary | ICD-10-CM | POA: Diagnosis not present

## 2024-01-09 DIAGNOSIS — Z7901 Long term (current) use of anticoagulants: Secondary | ICD-10-CM | POA: Insufficient documentation

## 2024-01-09 DIAGNOSIS — D122 Benign neoplasm of ascending colon: Secondary | ICD-10-CM | POA: Diagnosis not present

## 2024-01-09 DIAGNOSIS — Z1211 Encounter for screening for malignant neoplasm of colon: Secondary | ICD-10-CM | POA: Insufficient documentation

## 2024-01-09 HISTORY — PX: COLONOSCOPY WITH PROPOFOL: SHX5780

## 2024-01-09 HISTORY — PX: POLYPECTOMY: SHX5525

## 2024-01-09 SURGERY — COLONOSCOPY WITH PROPOFOL
Anesthesia: General

## 2024-01-09 MED ORDER — PROPOFOL 500 MG/50ML IV EMUL
INTRAVENOUS | Status: DC | PRN
  Administered 2024-01-09: 75 ug/kg/min via INTRAVENOUS

## 2024-01-09 MED ORDER — PHENYLEPHRINE 80 MCG/ML (10ML) SYRINGE FOR IV PUSH (FOR BLOOD PRESSURE SUPPORT)
PREFILLED_SYRINGE | INTRAVENOUS | Status: AC
  Filled 2024-01-09: qty 10

## 2024-01-09 MED ORDER — LIDOCAINE HCL (CARDIAC) PF 100 MG/5ML IV SOSY
PREFILLED_SYRINGE | INTRAVENOUS | Status: DC | PRN
  Administered 2024-01-09: 60 mg via INTRAVENOUS

## 2024-01-09 MED ORDER — SODIUM CHLORIDE 0.9 % IV SOLN: INTRAVENOUS | Status: DC

## 2024-01-09 MED ORDER — PROPOFOL 10 MG/ML IV BOLUS
INTRAVENOUS | Status: DC | PRN
  Administered 2024-01-09: 40 mg via INTRAVENOUS
  Administered 2024-01-09: 20 mg via INTRAVENOUS

## 2024-01-09 MED ORDER — LIDOCAINE HCL (PF) 2 % IJ SOLN
INTRAMUSCULAR | Status: AC
  Filled 2024-01-09: qty 10

## 2024-01-09 MED ORDER — PHENYLEPHRINE 80 MCG/ML (10ML) SYRINGE FOR IV PUSH (FOR BLOOD PRESSURE SUPPORT)
PREFILLED_SYRINGE | INTRAVENOUS | Status: DC | PRN
  Administered 2024-01-09 (×2): 160 ug via INTRAVENOUS
  Administered 2024-01-09: 80 ug via INTRAVENOUS
  Administered 2024-01-09: 160 ug via INTRAVENOUS

## 2024-01-09 MED ORDER — DEXMEDETOMIDINE HCL IN NACL 80 MCG/20ML IV SOLN
INTRAVENOUS | Status: DC | PRN
  Administered 2024-01-09: 20 ug via INTRAVENOUS

## 2024-01-09 NOTE — Anesthesia Preprocedure Evaluation (Addendum)
 Anesthesia Evaluation  Patient identified by MRN, date of birth, ID band Patient awake    Reviewed: Allergy & Precautions, H&P , NPO status , Patient's Chart, lab work & pertinent test results  Airway Mallampati: II  TM Distance: >3 FB Neck ROM: full    Dental  (+) Poor Dentition, Missing   Pulmonary asthma , former smoker   Pulmonary exam normal        Cardiovascular + dysrhythmias Atrial Fibrillation  Rhythm:Irregular Rate:Normal - Systolic murmurs    Neuro/Psych  PSYCHIATRIC DISORDERS  Depression    negative neurological ROS     GI/Hepatic negative GI ROS, Neg liver ROS,,,  Endo/Other  diabetes, Type 2    Renal/GU negative Renal ROS  negative genitourinary   Musculoskeletal   Abdominal Normal abdominal exam  (+)   Peds  Hematology negative hematology ROS (+)   Anesthesia Other Findings Past Medical History: No date: Arthritis No date: Asthma     Comment:  as child 08/20/2019: Atrial flutter with rapid ventricular response (HCC) 11/01/2019: Lumbar disc herniation 08/20/2019: New onset type 2 diabetes mellitus (HCC) No date: Pneumonia     Comment:  history of  Past Surgical History: 03/01/2013: ANTERIOR CERVICAL DECOMP/DISCECTOMY FUSION; N/A     Comment:  Procedure: Cervical Four-Five Cervical Five-Six Cervical              Six-Seven anterior cervical decompression with fusion               plating and bonegraft;  Surgeon: Corrina Dimitri, MD;                Location: [facility]UR[facility]S;  Service: Neurosurgery;                Laterality: N/A;  ANTERIOR CERVICAL               DECOMPRESSION/DISCECTOMY FUSION 3 LEVELS 08/22/2019: CARDIOVERSION; N/A     Comment:  Procedure: CARDIOVERSION;  Surgeon: Wendie Hamburg, MD;  Location: [facility] ENDOSCOPY;  Service:               Endoscopy;  Laterality: N/A; 08/22/2019: TEE WITHOUT CARDIOVERSION; N/A     Comment:  Procedure: TRANSESOPHAGEAL ECHOCARDIOGRAM  (TEE);                Surgeon: Wendie Hamburg, MD;  Location: [facility]               ENDOSCOPY;  Service: Endoscopy;  Laterality: N/A;     Reproductive/Obstetrics negative OB ROS                             Anesthesia Physical Anesthesia Plan  ASA: 3  Anesthesia Plan: General   Post-op Pain Management: Minimal or no pain anticipated   Induction: Intravenous  PONV Risk Score and Plan: Propofol  infusion and TIVA  Airway Management Planned: Natural Airway  Additional Equipment:   Intra-op Plan:   Post-operative Plan:   Informed Consent: I have reviewed the patients History and Physical, chart, labs and discussed the procedure including the risks, benefits and alternatives for the proposed anesthesia with the patient or authorized representative who has indicated his/her understanding and acceptance.     Dental Advisory Given  Plan Discussed with: CRNA and Surgeon  Anesthesia Plan Comments:        Anesthesia Quick Evaluation

## 2024-01-09 NOTE — Transfer of Care (Addendum)
 Immediate Anesthesia Transfer of Care Note  Patient: Cameron Arroyo  Procedure(s) Performed: COLONOSCOPY WITH PROPOFOL  POLYPECTOMY  Patient Location: PACU  Anesthesia Type:General  Level of Consciousness: sedated  Airway & Oxygen Therapy: Patient Spontanous Breathing  Post-op Assessment: Report given to RN and Post -op Vital signs reviewed and stable  Post vital signs: Reviewed and stable  Last Vitals:  Vitals Value Taken Time  BP 104/65 01/09/24 1318  Temp    Pulse 91 01/09/24 1321  Resp 15 01/09/24 1321  SpO2 96 % 01/09/24 1321  Vitals shown include unfiled device data.  Last Pain:  Vitals:   01/09/24 1318  TempSrc:   PainSc: Asleep         Complications: No notable events documented.

## 2024-01-09 NOTE — Op Note (Addendum)
 [facility]astroenterology Patient Name: Cameron Arroyo Procedure Date: 01/09/2024 12:40 PM MRN: [id number] Account #: [id number] Date of Birth: 02/14/1951 Admit Type: Outpatient Age: 73 Room: [facility] ENDO ROOM 2 Gender: Male Note Status: Finalized Instrument Name: Colonoscope [id number] Procedure:             Colonoscopy Indications:           High risk colon cancer surveillance: Personal history                         of colonic polyps Providers:             [name] DO, DO Referring MD:          Alona Arrow, PA Medicines:             Monitored Anesthesia Care Complications:         No immediate complications. Estimated blood loss:                         Minimal. Procedure:             Pre-Anesthesia Assessment:                        - Prior to the procedure, a History and Physical was                         performed, and patient medications and allergies were                         reviewed. The patient is competent. The risks and                         benefits of the procedure and the sedation options and                         risks were discussed with the patient. All questions                         were answered and informed consent was obtained.                         Patient identification and proposed procedure were                         verified by the physician, the nurse, the anesthetist                         and the technician in the endoscopy suite. Mental                         Status Examination: alert and oriented. Airway                         Examination: normal oropharyngeal airway and neck                         mobility. Respiratory Examination: clear to  auscultation. CV Examination: RRR, no murmurs, no S3                         or S4. Prophylactic Antibiotics: The patient does not                         require prophylactic antibiotics. Prior                         Anticoagulants:  The patient has taken Eliquis                          (apixaban ), last dose was 2 days prior to procedure.                         ASA Grade Assessment: III - A patient with severe                         systemic disease. After reviewing the risks and                         benefits, the patient was deemed in satisfactory                         condition to undergo the procedure. The anesthesia                         plan was to use monitored anesthesia care (MAC).                         Immediately prior to administration of medications,                         the patient was re-assessed for adequacy to receive                         sedatives. The heart rate, respiratory rate, oxygen                         saturations, blood pressure, adequacy of pulmonary                         ventilation, and response to care were monitored                         throughout the procedure. The physical status of the                         patient was re-assessed after the procedure.                        After obtaining informed consent, the colonoscope was                         passed under direct vision. Throughout the procedure,                         the patient's blood pressure, pulse, and oxygen  saturations were monitored continuously. The                         Colonoscope was introduced through the anus and                         advanced to the the terminal ileum, with                         identification of the appendiceal orifice and IC                         valve. The colonoscopy was performed without                         difficulty. The patient tolerated the procedure well.                         The quality of the bowel preparation was evaluated                         using the BBPS [facility] Bowel Preparation Scale) with                         scores of: Right Colon = 2 (minor amount of residual                         staining, small  fragments of stool and/or opaque                         liquid, but mucosa seen well), Transverse Colon = 3                         (entire mucosa seen well with no residual staining,                         small fragments of stool or opaque liquid) and Left                         Colon = 2 (minor amount of residual staining, small                         fragments of stool and/or opaque liquid, but mucosa                         seen well). The total BBPS score equals 7. The quality                         of the bowel preparation was good. The terminal ileum,                         ileocecal valve, appendiceal orifice, and rectum were                         photographed. Findings:      Hemorrhoids were found on perianal exam.      The digital rectal exam was normal. Pertinent negatives  include normal       sphincter tone.      The terminal ileum appeared normal. Estimated blood loss: none.       Estimated blood loss: none.      Retroflexion in the right colon was performed.      Three sessile polyps were found in the transverse colon, ascending colon       and cecum. The polyps were 1 to 2 mm in size. These polyps were removed       with a jumbo cold forceps. Resection and retrieval were complete.       Estimated blood loss was minimal.      The exam was otherwise without abnormality on direct and retroflexion       views. Impression:            - Hemorrhoids found on perianal exam.                        - The examined portion of the ileum was normal.                        - Three 1 to 2 mm polyps in the transverse colon, in                         the ascending colon and in the cecum, removed with a                         jumbo cold forceps. Resected and retrieved.                        - The examination was otherwise normal on direct and                         retroflexion views. Recommendation:        - Patient has a contact number available for                          emergencies. The signs and symptoms of potential                         delayed complications were discussed with the patient.                         Return to normal activities tomorrow. Written                         discharge instructions were provided to the patient.                        - Discharge patient to home.                        - Resume previous diet.                        - Continue present medications.                        - Await pathology results.                        -  Repeat colonoscopy for surveillance based on                         pathology results.                        - Resume Eliquis  (apixaban ) at prior dose tomorrow.                         Refer to managing physician for further adjustment of                         therapy.                        - Return to referring physician as previously                         scheduled.                        - The findings and recommendations were discussed with                         the patient. Procedure Code(s):     --- Professional ---                        [phone], Colonoscopy, flexible; with biopsy, single or                         multiple Diagnosis Code(s):     --- Professional ---                        Z86.010, Personal history of colonic polyps                        K64.9, Unspecified hemorrhoids                        D12.3, Benign neoplasm of transverse colon (hepatic                         flexure or splenic flexure)                        D12.2, Benign neoplasm of ascending colon                        D12.0, Benign neoplasm of cecum CPT copyright 2022 American Medical Association. All rights reserved. The codes documented in this report are preliminary and upon coder review may  be revised to meet current compliance requirements. Attending Participation:      I personally performed the entire procedure. Polo Brisk, DO [name] DO, DO 01/09/2024 1:18:49 PM This  report has been signed electronically. Number of Addenda: 0 Note Initiated On: 01/09/2024 12:40 PM Scope Withdrawal Time: 0 hours 13 minutes 55 seconds  Total Procedure Duration: 0 hours 20 minutes 37 seconds  Estimated Blood Loss:  Estimated blood loss was minimal.      [facility]

## 2024-01-09 NOTE — Interval H&P Note (Signed)
 History and Physical Interval Note: Preprocedure H&P from 01/09/24  was reviewed and there was no interval change after seeing and examining the patient.  Written consent was obtained from the patient after discussion of risks, benefits, and alternatives. Patient has consented to proceed with Colonoscopy with possible intervention   01/09/2024 12:46 PM  Cameron Arroyo  has presented today for surgery, with the diagnosis of Z86.0101 (ICD-10-CM) - Hx of adenomatous colonic polyps.  The various methods of treatment have been discussed with the patient and family. After consideration of risks, benefits and other options for treatment, the patient has consented to  Procedure(s): COLONOSCOPY WITH PROPOFOL  (N/A) as a surgical intervention.  The patient's history has been reviewed, patient examined, no change in status, stable for surgery.  I have reviewed the patient's chart and labs.  Questions were answered to the patient's satisfaction.     [name]

## 2024-01-10 ENCOUNTER — Encounter: Payer: Self-pay | Admitting: Gastroenterology

## 2024-01-10 LAB — SURGICAL PATHOLOGY

## 2024-01-10 NOTE — Anesthesia Postprocedure Evaluation (Signed)
Anesthesia Post Note  Patient: Cameron Arroyo  Procedure(s) Performed: COLONOSCOPY WITH PROPOFOL POLYPECTOMY  Patient location during evaluation: PACU Anesthesia Type: General Level of consciousness: awake and alert Pain management: pain level controlled Vital Signs Assessment: post-procedure vital signs reviewed and stable Respiratory status: spontaneous breathing, nonlabored ventilation and respiratory function stable Cardiovascular status: blood pressure returned to baseline and stable Postop Assessment: no apparent nausea or vomiting Anesthetic complications: no   No notable events documented.   Last Vitals:  Vitals:   01/09/24 1335 01/09/24 1341  BP: 92/62 95/67  Pulse: 83 67  Resp: 18 15  Temp:    SpO2: 97% 98%    Last Pain:  Vitals:   01/09/24 1341  TempSrc:   PainSc: 0-No pain                 Foye Deer

## 2024-01-26 ENCOUNTER — Telehealth: Payer: Self-pay

## 2024-01-26 DIAGNOSIS — E1165 Type 2 diabetes mellitus with hyperglycemia: Secondary | ICD-10-CM

## 2024-01-26 NOTE — Telephone Encounter (Signed)
 Orders Placed This Encounter  Procedures   Comprehensive metabolic panel   Lipid panel   Microalbumin / creatinine urine ratio   Hemoglobin A1c   C-peptide

## 2024-01-27 ENCOUNTER — Other Ambulatory Visit: Payer: Medicare HMO

## 2024-01-28 LAB — LIPID PANEL
Cholesterol: 118 mg/dL (ref <200)
HDL: 37 mg/dL — ABNORMAL LOW (ref >=40)
LDL Cholesterol (Calc): 57 mg/dL
Non-HDL Cholesterol (Calc): 81 mg/dL (ref <130)
Total CHOL/HDL Ratio: 3.2 (calc) (ref <5.0)
Triglycerides: 159 mg/dL — ABNORMAL HIGH (ref <150)

## 2024-01-28 LAB — COMPREHENSIVE METABOLIC PANEL
AG Ratio: 2.4 (calc) (ref 1.0–2.5)
ALT: 13 U/L (ref 9–46)
AST: 13 U/L (ref 10–35)
Albumin: 4.7 g/dL (ref 3.6–5.1)
Alkaline phosphatase (APISO): 75 U/L (ref 35–144)
BUN: 17 mg/dL (ref 7–25)
CO2: 28 mmol/L (ref 20–32)
Calcium: 9.6 mg/dL (ref 8.6–10.3)
Chloride: 105 mmol/L (ref 98–110)
Creat: 0.73 mg/dL (ref 0.70–1.28)
Globulin: 2 g/dL (ref 1.9–3.7)
Glucose, Bld: 111 mg/dL — ABNORMAL HIGH (ref 65–99)
Potassium: 4.9 mmol/L (ref 3.5–5.3)
Sodium: 142 mmol/L (ref 135–146)
Total Bilirubin: 1 mg/dL (ref 0.2–1.2)
Total Protein: 6.7 g/dL (ref 6.1–8.1)

## 2024-01-28 LAB — MICROALBUMIN / CREATININE URINE RATIO
Creatinine, Urine: 70 mg/dL (ref 20–320)
Microalb Creat Ratio: 13 mg/g{creat} (ref <30)
Microalb, Ur: 0.9 mg/dL

## 2024-01-28 LAB — C-PEPTIDE: C-Peptide: 2.2 ng/mL (ref 0.80–3.85)

## 2024-01-28 LAB — HEMOGLOBIN A1C
Hgb A1c MFr Bld: 6.8 %{Hb} — ABNORMAL HIGH (ref <5.7)
Mean Plasma Glucose: 148 mg/dL
eAG (mmol/L): 8.2 mmol/L

## 2024-01-31 ENCOUNTER — Ambulatory Visit (INDEPENDENT_AMBULATORY_CARE_PROVIDER_SITE_OTHER): Payer: Medicare HMO | Admitting: "Endocrinology

## 2024-01-31 ENCOUNTER — Encounter: Payer: Self-pay | Admitting: "Endocrinology

## 2024-01-31 VITALS — BP 120/70 | HR 85 | Ht 75.0 in | Wt 194.0 lb

## 2024-01-31 DIAGNOSIS — E1165 Type 2 diabetes mellitus with hyperglycemia: Secondary | ICD-10-CM | POA: Diagnosis not present

## 2024-01-31 DIAGNOSIS — Z7984 Long term (current) use of oral hypoglycemic drugs: Secondary | ICD-10-CM | POA: Diagnosis not present

## 2024-01-31 DIAGNOSIS — E782 Mixed hyperlipidemia: Secondary | ICD-10-CM | POA: Diagnosis not present

## 2024-01-31 DIAGNOSIS — Z794 Long term (current) use of insulin: Secondary | ICD-10-CM | POA: Diagnosis not present

## 2024-01-31 MED ORDER — EMPAGLIFLOZIN 10 MG PO TABS: 10.0000 mg | ORAL_TABLET | Freq: Every day | ORAL | 0 refills | Status: DC

## 2024-01-31 MED ORDER — ONETOUCH VERIO VI STRP: ORAL_STRIP | 6 refills | Status: AC

## 2024-01-31 MED ORDER — ONETOUCH DELICA LANCETS 33G MISC: 6 refills | Status: AC

## 2024-01-31 NOTE — Progress Notes (Signed)
 Outpatient Endocrinology Note Cameron Charles Mix, MD  01/31/24   Cameron Arroyo 27-Jul-1951 [id number]  Referring Provider: Storm Frisk, MD Primary Care Provider: Blima Singer, PA Reason for consultation: Subjective   Assessment & Plan  Diagnoses and all orders for this visit:  Long term (current) use of oral hypoglycemic drugs  Controlled type 2 diabetes mellitus with hyperglycemia, with long-term current use of insulin (HCC) -     glucose blood (ONETOUCH VERIO) test strip; Use to check blood sugar twice daily. Dx E11.65 -     OneTouch Delica Lancets 33G MISC; Use to check blood sugar twice daily. Dx E11.65  Long-term insulin use (HCC)  Mixed hypercholesterolemia and hypertriglyceridemia  Other orders -     empagliflozin (JARDIANCE) 10 MG TABS tablet; Take 1 tablet (10 mg total) by mouth daily before breakfast.    Diabetes Type II with no known complications, No results found for: "GFR" Hba1c goal less than 7, current Hba1c is  Lab Results  Component Value Date   HGBA1C 6.8 (H) 01/27/2024   Will recommend the following: Metformin 1000 mg bid  Start jardiance 10 mg every day Tresiba 10 units every day   No known contraindications/side effects to any of above medications  -Last LD and Tg are as follows: Lab Results  Component Value Date   LDLCALC 57 01/27/2024    Lab Results  Component Value Date   TRIG 159 (H) 01/27/2024   -On atorvastatin 10 mg QD -Follow low fat diet and exercise   -Blood pressure goal <140/90 - Microalbumin/creatinine goal is < 30 -Last MA/Cr is as follows: Lab Results  Component Value Date   MICROALBUR 0.9 01/27/2024   -not on ACE/ARB  -diet changes including salt restriction -limit eating outside -counseled BP targets per standards of diabetes care -uncontrolled blood pressure can lead to retinopathy, nephropathy and cardiovascular and atherosclerotic heart disease  Reviewed and counseled on: -A1C target -Blood  sugar targets -Complications of uncontrolled diabetes  -Checking blood sugar before meals and bedtime and bring log next visit -All medications with mechanism of action and side effects -Hypoglycemia management: rule of 15's, Glucagon Emergency Kit and medical alert ID -low-carb low-fat plate-method diet -At least 20 minutes of physical activity per day -Annual dilated retinal eye exam and foot exam -compliance and follow up needs -follow up as scheduled or earlier if problem gets worse  Call if blood sugar is less than 70 or consistently above 250    Take a 15 gm snack of carbohydrate at bedtime before you go to sleep if your blood sugar is less than 100.    If you are going to fast after midnight for a test or procedure, ask your physician for instructions on how to reduce/decrease your insulin dose.    Call if blood sugar is less than 70 or consistently above 250  -Treating a low sugar by rule of 15  (15 gms of sugar every 15 min until sugar is more than 70) If you feel your sugar is low, test your sugar to be sure If your sugar is low (less than 70), then take 15 grams of a fast acting Carbohydrate (3-4 glucose tablets or glucose gel or 4 ounces of juice or regular soda) Recheck your sugar 15 min after treating low to make sure it is more than 70 If sugar is still less than 70, treat again with 15 grams of carbohydrate          Don't drive the  hour of hypoglycemia  If unconscious/unable to eat or drink by mouth, use glucagon injection or nasal spray baqsimi and call 911. Can repeat again in 15 min if still unconscious.  Return in about 4 months (around 06/01/2024).   I have reviewed current medications, nurse's notes, allergies, vital signs, past medical and surgical history, family medical history, and social history for this encounter. Counseled patient on symptoms, examination findings, lab findings, imaging results, treatment decisions and monitoring and prognosis. The patient  understood the recommendations and agrees with the treatment plan. All questions regarding treatment plan were fully answered.  Cameron , MD  01/31/24    History of Present Illness Cameron Arroyo is a 73 y.o. year old male who presents for evaluation of Type II diabetes mellitus.  Cameron Arroyo was first diagnosed in 2020.   Diabetes education -  Home diabetes regimen: Metformin 1000 mg bid  Tresiba 10 units every day   COMPLICATIONS -  MI/Stroke -  retinopathy -  neuropathy -  nephropathy  SYMPTOMS REVIEWED - Polyuria + Weight loss - Blurred vision  BLOOD SUGAR DATA 95-150 checks bid  Physical Exam  BP 120/70 (BP Location: Right Arm, Patient Position: Sitting)   Pulse 85   Ht 6\' 3"  (1.905 m)   Wt 194 lb (88 kg)   SpO2 98%   BMI 24.25 kg/m    Constitutional: well developed, well nourished Head: normocephalic, atraumatic Eyes: sclera anicteric, no redness Neck: supple Lungs: normal respiratory effort Neurology: alert and oriented Skin: dry, no appreciable rashes Musculoskeletal: no appreciable defects Psychiatric: normal mood and affect Diabetic Foot Exam - Simple   No data filed      Current Medications Patient's Medications  New Prescriptions   EMPAGLIFLOZIN (JARDIANCE) 10 MG TABS TABLET    Take 1 tablet (10 mg total) by mouth daily before breakfast.  Previous Medications   APIXABAN (ELIQUIS) 5 MG TABS TABLET    Take 1 tablet (5 mg total) by mouth 2 (two) times daily.   ATORVASTATIN (LIPITOR) 10 MG TABLET    Take 1 tablet (10 mg total) by mouth daily.   BD PEN NEEDLE NANO 2ND GEN 32G X 4 MM MISC    USE WITH TRESIBA   BLOOD GLUCOSE MONITORING SUPPL (ONETOUCH VERIO REFLECT) W/DEVICE KIT    Use to check blood sugar once daily. Dx E11.65   BLOOD GLUCOSE MONITORING SUPPL (ONETOUCH VERIO) W/DEVICE KIT    Use to check blood sugar once daily. Dx E11.65   INSULIN DEGLUDEC (TRESIBA FLEXTOUCH) 100 UNIT/ML FLEXTOUCH PEN    Inject 10 Units into the skin  daily.   METFORMIN (GLUCOPHAGE) 1000 MG TABLET    Take 1 tablet (1,000 mg total) by mouth 2 (two) times daily with a meal.   METOPROLOL TARTRATE (LOPRESSOR) 25 MG TABLET    Take 0.5 tablets (12.5 mg total) by mouth daily.  Modified Medications   Modified Medication Previous Medication   GLUCOSE BLOOD (ONETOUCH VERIO) TEST STRIP glucose blood (ONETOUCH VERIO) test strip      Use to check blood sugar twice daily. Dx E11.65    Use to check blood sugar twice daily. Dx E11.65   ONETOUCH DELICA LANCETS 33G MISC OneTouch Delica Lancets 33G MISC      Use to check blood sugar twice daily. Dx E11.65    Use to check blood sugar twice daily. Dx E11.65  Discontinued Medications   No medications on file    Allergies No Known Allergies  Past Medical History  Past Medical History:  Diagnosis Date   Arthritis    Asthma    as child   Atrial flutter with rapid ventricular response (HCC) 08/20/2019   Lumbar disc herniation 11/01/2019   New onset type 2 diabetes mellitus (HCC) 08/20/2019   Pneumonia    history of    Past Surgical History Past Surgical History:  Procedure Laterality Date   ANTERIOR CERVICAL DECOMP/DISCECTOMY FUSION N/A 03/01/2013   Procedure: Cervical Four-Five Cervical Five-Six Cervical Six-Seven anterior cervical decompression with fusion plating and bonegraft;  Surgeon: Hewitt Shorts, MD;  Location: [facility];  Service: Neurosurgery;  Laterality: N/A;  ANTERIOR CERVICAL DECOMPRESSION/DISCECTOMY FUSION 3 LEVELS   CARDIOVERSION N/A 08/22/2019   Procedure: CARDIOVERSION;  Surgeon: Little Ishikawa, MD;  Location: [facility] ENDOSCOPY;  Service: Endoscopy;  Laterality: N/A;   COLONOSCOPY WITH PROPOFOL N/A 01/09/2024   Procedure: COLONOSCOPY WITH PROPOFOL;  Surgeon: Jaynie Collins, DO;  Location: [facility] ENDOSCOPY;  Service: Gastroenterology;  Laterality: N/A;   POLYPECTOMY  01/09/2024   Procedure: POLYPECTOMY;  Surgeon: Jaynie Collins, DO;  Location: [facility] ENDOSCOPY;   Service: Gastroenterology;;   TEE WITHOUT CARDIOVERSION N/A 08/22/2019   Procedure: TRANSESOPHAGEAL ECHOCARDIOGRAM (TEE);  Surgeon: Little Ishikawa, MD;  Location: [facility] ENDOSCOPY;  Service: Endoscopy;  Laterality: N/A;    Family History family history includes Cancer in his father; Cirrhosis in his mother.  Social History Social History   Socioeconomic History   Marital status: Married    Spouse name: Not on file   Number of children: Not on file   Years of education: Not on file   Highest education level: Not on file  Occupational History   Not on file  Tobacco Use   Smoking status: Former    Current packs/day: 0.00    Types: Cigarettes    Quit date: 11/29/1992    Years since quitting: 31.1   Smokeless tobacco: Never  Vaping Use   Vaping status: Never Used  Substance and Sexual Activity   Alcohol use: No   Drug use: Never   Sexual activity: Not Currently  Other Topics Concern   Not on file  Social History Narrative   Not on file   Social Drivers of Health   Financial Resource Strain: Low Risk  (03/16/2023)   Overall Financial Resource Strain (CARDIA)    Difficulty of Paying Living Expenses: Not hard at all  Food Insecurity: No Food Insecurity (03/16/2023)   Hunger Vital Sign    Worried About Running Out of Food in the Last Year: Never true    Ran Out of Food in the Last Year: Never true  Transportation Needs: No Transportation Needs (03/16/2023)   PRAPARE - Administrator, Civil Service (Medical): No    Lack of Transportation (Non-Medical): No  Physical Activity: Inactive (03/16/2023)   Exercise Vital Sign    Days of Exercise per Week: 0 days    Minutes of Exercise per Session: 0 min  Stress: No Stress Concern Present (03/16/2023)   [employer] of Occupational Health - Occupational Stress Questionnaire    Feeling of Stress : Not at all  Social Connections: Not on file  Intimate Partner Violence: Not on file    Lab Results  Component Value  Date   HGBA1C 6.8 (H) 01/27/2024   HGBA1C 5.8 05/31/2023   HGBA1C 6.5 (A) 02/14/2023   Lab Results  Component Value Date   CHOL 118 01/27/2024   Lab Results  Component Value Date   HDL 37 (  L) 01/27/2024   Lab Results  Component Value Date   LDLCALC 57 01/27/2024   Lab Results  Component Value Date   TRIG 159 (H) 01/27/2024   Lab Results  Component Value Date   CHOLHDL 3.2 01/27/2024   Lab Results  Component Value Date   CREATININE 0.73 01/27/2024   No results found for: "GFR" Lab Results  Component Value Date   MICROALBUR 0.9 01/27/2024      Component Value Date/Time   NA 142 01/27/2024 1022   NA 142 10/26/2023 0932   K 4.9 01/27/2024 1022   CL 105 01/27/2024 1022   CO2 28 01/27/2024 1022   GLUCOSE 111 (H) 01/27/2024 1022   BUN 17 01/27/2024 1022   BUN 14 10/26/2023 0932   CREATININE 0.73 01/27/2024 1022   CALCIUM 9.6 01/27/2024 1022   PROT 6.7 01/27/2024 1022   PROT 6.6 02/14/2023 1045   ALBUMIN 4.5 10/26/2023 0932   AST 13 01/27/2024 1022   ALT 13 01/27/2024 1022   ALKPHOS 86 02/14/2023 1045   BILITOT 1.0 01/27/2024 1022   BILITOT 0.7 02/14/2023 1045   GFRNONAA 97 10/01/2020 0927   GFRAA 112 10/01/2020 0927      Latest Ref Rng & Units 01/27/2024   10:22 AM 10/26/2023    9:32 AM 02/14/2023   10:45 AM  BMP  Glucose 65 - 99 mg/dL 829  CANCELED  562   BUN 7 - 25 mg/dL 17  14  15    Creatinine 0.70 - 1.28 mg/dL 1.30  8.65  7.84   BUN/Creat Ratio 6 - 22 (calc) SEE NOTE:  16  20   Sodium 135 - 146 mmol/L 142  142  141   Potassium 3.5 - 5.3 mmol/L 4.9  CANCELED  4.5   Chloride 98 - 110 mmol/L 105  104  101   CO2 20 - 32 mmol/L 28  16    Calcium 8.6 - 10.3 mg/dL 9.6  9.5  9.8        Component Value Date/Time   WBC 6.6 10/26/2023 0932   WBC 5.8 10/30/2019 0939   RBC 4.88 10/26/2023 0932   RBC 4.53 10/30/2019 0939   HGB 15.2 10/26/2023 0932   HCT 45.2 10/26/2023 0932   PLT 226 10/26/2023 0932   MCV 93 10/26/2023 0932   MCH 31.1 10/26/2023 0932    MCH 31.6 10/30/2019 0939   MCHC 33.6 10/26/2023 0932   MCHC 32.6 10/30/2019 0939   RDW 12.5 10/26/2023 0932   LYMPHSABS 1.7 10/26/2023 0932   MONOABS 0.4 08/22/2019 0310   EOSABS 0.1 10/26/2023 0932   BASOSABS 0.0 10/26/2023 0932     Parts of this note may have been dictated using voice recognition software. There may be variances in spelling and vocabulary which are unintentional. Not all errors are proofread. Please notify the Thereasa Parkin if any discrepancies are noted or if the meaning of any statement is not clear.

## 2024-01-31 NOTE — Patient Instructions (Signed)

## 2024-03-20 ENCOUNTER — Ambulatory Visit: Payer: Medicare HMO

## 2024-03-30 ENCOUNTER — Other Ambulatory Visit: Payer: Self-pay | Admitting: "Endocrinology

## 2024-03-30 NOTE — Telephone Encounter (Signed)
 Requested Prescriptions   Pending Prescriptions Disp Refills   JARDIANCE  10 MG TABS tablet [Pharmacy Med Name: JARDIANCE  10 MG TABLET] 90 tablet 0    Sig: TAKE 1 TABLET BY MOUTH DAILY BEFORE BREAKFAST.

## 2024-05-31 ENCOUNTER — Ambulatory Visit (INDEPENDENT_AMBULATORY_CARE_PROVIDER_SITE_OTHER): Admitting: "Endocrinology

## 2024-05-31 ENCOUNTER — Encounter: Payer: Self-pay | Admitting: "Endocrinology

## 2024-05-31 VITALS — BP 134/70 | HR 60 | Ht 75.0 in | Wt 185.0 lb

## 2024-05-31 DIAGNOSIS — E119 Type 2 diabetes mellitus without complications: Secondary | ICD-10-CM

## 2024-05-31 DIAGNOSIS — E782 Mixed hyperlipidemia: Secondary | ICD-10-CM

## 2024-05-31 DIAGNOSIS — Z7984 Long term (current) use of oral hypoglycemic drugs: Secondary | ICD-10-CM | POA: Diagnosis not present

## 2024-05-31 DIAGNOSIS — Z794 Long term (current) use of insulin: Secondary | ICD-10-CM

## 2024-05-31 LAB — POCT GLYCOSYLATED HEMOGLOBIN (HGB A1C): Hemoglobin A1C: 6.1 % — AB (ref 4.0–5.6)

## 2024-05-31 MED ORDER — EMPAGLIFLOZIN 25 MG PO TABS: 25.0000 mg | ORAL_TABLET | Freq: Every day | ORAL | 1 refills | Status: DC

## 2024-05-31 MED ORDER — FREESTYLE LIBRE 3 PLUS SENSOR MISC: 3 refills | Status: DC

## 2024-05-31 MED ORDER — BD PEN NEEDLE NANO 2ND GEN 32G X 4 MM MISC: 1 refills | Status: AC

## 2024-05-31 NOTE — Progress Notes (Signed)
 Outpatient Endocrinology Note Cameron Birmingham, MD  05/31/24   Cameron Arroyo Jun 04, 1951 [id number]  Referring Provider: Stacia Millman, PA Primary Care Provider: Stacia Millman, PA Reason for consultation: Subjective   Assessment & Plan  Diagnoses and all orders for this visit:  Controlled type 2 diabetes mellitus without complication, with long-term current use of insulin  (HCC) -     POCT glycosylated hemoglobin (Hb A1C) -     empagliflozin  (JARDIANCE ) 25 MG TABS tablet; Take 1 tablet (25 mg total) by mouth daily before breakfast. -     Continuous Glucose Sensor (FREESTYLE LIBRE 3 PLUS SENSOR) MISC; Change sensor every 15 days. -     BD PEN NEEDLE NANO 2ND GEN 32G X 4 MM MISC; Use with Tresiba  -     Ambulatory referral to Podiatry  Long term (current) use of oral hypoglycemic drugs  Mixed hypercholesterolemia and hypertriglyceridemia    Diabetes Type II with no known complications, No results found for: GFR Hba1c goal less than 7, current Hba1c is  Lab Results  Component Value Date   HGBA1C 6.1 (A) 05/31/2024   Will recommend the following: Metformin  1000 mg bid  Jardiance  25 mg every day Tresiba  8 units every day   No known contraindications/side effects to any of above medications  -Last LD and Tg are as follows: Lab Results  Component Value Date   LDLCALC 57 01/27/2024    Lab Results  Component Value Date   TRIG 159 (H) 01/27/2024   -On atorvastatin  10 mg QD -Follow low fat diet and exercise   -Blood pressure goal <140/90 - Microalbumin/creatinine goal is < 30 -Last MA/Cr is as follows: Lab Results  Component Value Date   MICROALBUR 0.9 01/27/2024   -not on ACE/ARB  -diet changes including salt restriction -limit eating outside -counseled BP targets per standards of diabetes care -uncontrolled blood pressure can lead to retinopathy, nephropathy and cardiovascular and atherosclerotic heart disease  Reviewed and counseled on: -A1C  target -Blood sugar targets -Complications of uncontrolled diabetes  -Checking blood sugar before meals and bedtime and bring log next visit -All medications with mechanism of action and side effects -Hypoglycemia management: rule of 15's, Glucagon Emergency Kit and medical alert ID -low-carb low-fat plate-method diet -At least 20 minutes of physical activity per day -Annual dilated retinal eye exam and foot exam -compliance and follow up needs -follow up as scheduled or earlier if problem gets worse  Call if blood sugar is less than 70 or consistently above 250    Take a 15 gm snack of carbohydrate at bedtime before you go to sleep if your blood sugar is less than 100.    If you are going to fast after midnight for a test or procedure, ask your physician for instructions on how to reduce/decrease your insulin  dose.    Call if blood sugar is less than 70 or consistently above 250  -Treating a low sugar by rule of 15  (15 gms of sugar every 15 min until sugar is more than 70) If you feel your sugar is low, test your sugar to be sure If your sugar is low (less than 70), then take 15 grams of a fast acting Carbohydrate (3-4 glucose tablets or glucose gel or 4 ounces of juice or regular soda) Recheck your sugar 15 min after treating low to make sure it is more than 70 If sugar is still less than 70, treat again with 15 grams of carbohydrate  Don't drive the hour of hypoglycemia  If unconscious/unable to eat or drink by mouth, use glucagon injection or nasal spray baqsimi and call 911. Can repeat again in 15 min if still unconscious.  Return in about 3 months (around 08/31/2024).   I have reviewed current medications, nurse's notes, allergies, vital signs, past medical and surgical history, family medical history, and social history for this encounter. Counseled patient on symptoms, examination findings, lab findings, imaging results, treatment decisions and monitoring and prognosis.  The patient understood the recommendations and agrees with the treatment plan. All questions regarding treatment plan were fully answered.  Cameron Birmingham, MD  05/31/24    History of Present Illness Cameron Arroyo is a 73 y.o. year old male who presents for evaluation of Type II diabetes mellitus.  Cameron Arroyo was first diagnosed in 2020.   Diabetes education -  Home diabetes regimen: Metformin  1000 mg bid  Jardiance  10 mg every day  Tresiba  10 units every day   COMPLICATIONS -  MI/Stroke -  retinopathy -  neuropathy -  nephropathy  BLOOD SUGAR DATA 95-170 checks bid  Physical Exam  BP 134/70   Pulse 60   Ht 6' 3 (1.905 m)   Wt 185 lb (83.9 kg)   SpO2 95%   BMI 23.12 kg/m    Constitutional: well developed, well nourished Head: normocephalic, atraumatic Eyes: sclera anicteric, no redness Neck: supple Lungs: normal respiratory effort Neurology: alert and oriented Skin: dry, no appreciable rashes Musculoskeletal: no appreciable defects Psychiatric: normal mood and affect Diabetic Foot Exam - Simple   Simple Foot Form Diabetic Foot exam was performed with the following findings: Yes 05/31/2024  8:29 AM  Visual Inspection No deformities, no ulcerations, no other skin breakdown bilaterally: Yes Sensation Testing Intact to touch and monofilament testing bilaterally: Yes Pulse Check Posterior Tibialis and Dorsalis pulse intact bilaterally: Yes Comments Ingrown toe nail, r/o onychomycosis, B/L foot base callus       Current Medications Patient's Medications  New Prescriptions   CONTINUOUS GLUCOSE SENSOR (FREESTYLE LIBRE 3 PLUS SENSOR) MISC    Change sensor every 15 days.  Previous Medications   APIXABAN  (ELIQUIS ) 5 MG TABS TABLET    Take 1 tablet (5 mg total) by mouth 2 (two) times daily.   ATORVASTATIN  (LIPITOR) 10 MG TABLET    Take 1 tablet (10 mg total) by mouth daily.   BLOOD GLUCOSE MONITORING SUPPL (ONETOUCH VERIO REFLECT) W/DEVICE KIT    Use to check  blood sugar once daily. Dx E11.65   BLOOD GLUCOSE MONITORING SUPPL (ONETOUCH VERIO) W/DEVICE KIT    Use to check blood sugar once daily. Dx E11.65   GLUCOSE BLOOD (ONETOUCH VERIO) TEST STRIP    Use to check blood sugar twice daily. Dx E11.65   INSULIN  DEGLUDEC (TRESIBA  FLEXTOUCH) 100 UNIT/ML FLEXTOUCH PEN    Inject 10 Units into the skin daily.   METFORMIN  (GLUCOPHAGE ) 1000 MG TABLET    Take 1 tablet (1,000 mg total) by mouth 2 (two) times daily with a meal.   METOPROLOL  TARTRATE (LOPRESSOR ) 25 MG TABLET    Take 0.5 tablets (12.5 mg total) by mouth daily.   ONETOUCH DELICA LANCETS 33G MISC    Use to check blood sugar twice daily. Dx E11.65  Modified Medications   Modified Medication Previous Medication   BD PEN NEEDLE NANO 2ND GEN 32G X 4 MM MISC BD PEN NEEDLE NANO 2ND GEN 32G X 4 MM MISC      Use with Tresiba   USE WITH TRESIBA    EMPAGLIFLOZIN  (JARDIANCE ) 25 MG TABS TABLET JARDIANCE  10 MG TABS tablet      Take 1 tablet (25 mg total) by mouth daily before breakfast.    TAKE 1 TABLET BY MOUTH DAILY BEFORE BREAKFAST.  Discontinued Medications   No medications on file    Allergies No Known Allergies  Past Medical History Past Medical History:  Diagnosis Date   Arthritis    Asthma    as child   Atrial flutter with rapid ventricular response (HCC) 08/20/2019   Lumbar disc herniation 11/01/2019   New onset type 2 diabetes mellitus (HCC) 08/20/2019   Pneumonia    history of    Past Surgical History Past Surgical History:  Procedure Laterality Date   ANTERIOR CERVICAL DECOMP/DISCECTOMY FUSION N/A 03/01/2013   Procedure: Cervical Four-Five Cervical Five-Six Cervical Six-Seven anterior cervical decompression with fusion plating and bonegraft;  Surgeon: Lamar LELON Peaches, MD;  Location: [facility];  Service: Neurosurgery;  Laterality: N/A;  ANTERIOR CERVICAL DECOMPRESSION/DISCECTOMY FUSION 3 LEVELS   CARDIOVERSION N/A 08/22/2019   Procedure: CARDIOVERSION;  Surgeon: Kate Lonni CROME,  MD;  Location: [facility] ENDOSCOPY;  Service: Endoscopy;  Laterality: N/A;   COLONOSCOPY WITH PROPOFOL  N/A 01/09/2024   Procedure: COLONOSCOPY WITH PROPOFOL ;  Surgeon: Onita Elspeth Sharper, DO;  Location: [facility] ENDOSCOPY;  Service: Gastroenterology;  Laterality: N/A;   POLYPECTOMY  01/09/2024   Procedure: POLYPECTOMY;  Surgeon: Onita Elspeth Sharper, DO;  Location: [facility] ENDOSCOPY;  Service: Gastroenterology;;   TEE WITHOUT CARDIOVERSION N/A 08/22/2019   Procedure: TRANSESOPHAGEAL ECHOCARDIOGRAM (TEE);  Surgeon: Kate Lonni CROME, MD;  Location: [facility] ENDOSCOPY;  Service: Endoscopy;  Laterality: N/A;    Family History family history includes Cancer in his father; Cirrhosis in his mother.  Social History Social History   Socioeconomic History   Marital status: Married    Spouse name: Not on file   Number of children: Not on file   Years of education: Not on file   Highest education level: Not on file  Occupational History   Not on file  Tobacco Use   Smoking status: Former    Current packs/day: 0.00    Types: Cigarettes    Quit date: 11/29/1992    Years since quitting: 31.5   Smokeless tobacco: Never  Vaping Use   Vaping status: Never Used  Substance and Sexual Activity   Alcohol use: No   Drug use: Never   Sexual activity: Not Currently  Other Topics Concern   Not on file  Social History Narrative   Not on file   Social Drivers of Health   Financial Resource Strain: Low Risk  (03/16/2023)   Overall Financial Resource Strain (CARDIA)    Difficulty of Paying Living Expenses: Not hard at all  Food Insecurity: No Food Insecurity (03/16/2023)   Hunger Vital Sign    Worried About Running Out of Food in the Last Year: Never true    Ran Out of Food in the Last Year: Never true  Transportation Needs: No Transportation Needs (03/16/2023)   PRAPARE - Administrator, Civil Service (Medical): No    Lack of Transportation (Non-Medical): No  Physical Activity: Inactive  (03/16/2023)   Exercise Vital Sign    Days of Exercise per Week: 0 days    Minutes of Exercise per Session: 0 min  Stress: No Stress Concern Present (03/16/2023)   [employer] of Occupational Health - Occupational Stress Questionnaire    Feeling of Stress : Not at all  Social Connections: Not on file  Intimate Partner Violence: Not on file    Lab Results  Component Value Date   HGBA1C 6.1 (A) 05/31/2024   HGBA1C 6.8 (H) 01/27/2024   HGBA1C 5.8 05/31/2023   Lab Results  Component Value Date   CHOL 118 01/27/2024   Lab Results  Component Value Date   HDL 37 (L) 01/27/2024   Lab Results  Component Value Date   LDLCALC 57 01/27/2024   Lab Results  Component Value Date   TRIG 159 (H) 01/27/2024   Lab Results  Component Value Date   CHOLHDL 3.2 01/27/2024   Lab Results  Component Value Date   CREATININE 0.73 01/27/2024   No results found for: GFR Lab Results  Component Value Date   MICROALBUR 0.9 01/27/2024      Component Value Date/Time   NA 142 01/27/2024 1022   NA 142 10/26/2023 0932   K 4.9 01/27/2024 1022   CL 105 01/27/2024 1022   CO2 28 01/27/2024 1022   GLUCOSE 111 (H) 01/27/2024 1022   BUN 17 01/27/2024 1022   BUN 14 10/26/2023 0932   CREATININE 0.73 01/27/2024 1022   CALCIUM  9.6 01/27/2024 1022   PROT 6.7 01/27/2024 1022   PROT 6.6 02/14/2023 1045   ALBUMIN 4.5 10/26/2023 0932   AST 13 01/27/2024 1022   ALT 13 01/27/2024 1022   ALKPHOS 86 02/14/2023 1045   BILITOT 1.0 01/27/2024 1022   BILITOT 0.7 02/14/2023 1045   GFRNONAA 97 10/01/2020 0927   GFRAA 112 10/01/2020 0927      Latest Ref Rng & Units 01/27/2024   10:22 AM 10/26/2023    9:32 AM 02/14/2023   10:45 AM  BMP  Glucose 65 - 99 mg/dL 888  CANCELED  848   BUN 7 - 25 mg/dL 17  14  15    Creatinine 0.70 - 1.28 mg/dL 9.26  9.14  9.23   BUN/Creat Ratio 6 - 22 (calc) SEE NOTE:  16  20   Sodium 135 - 146 mmol/L 142  142  141   Potassium 3.5 - 5.3 mmol/L 4.9  CANCELED  4.5    Chloride 98 - 110 mmol/L 105  104  101   CO2 20 - 32 mmol/L 28  16    Calcium  8.6 - 10.3 mg/dL 9.6  9.5  9.8        Component Value Date/Time   WBC 6.6 10/26/2023 0932   WBC 5.8 10/30/2019 0939   RBC 4.88 10/26/2023 0932   RBC 4.53 10/30/2019 0939   HGB 15.2 10/26/2023 0932   HCT 45.2 10/26/2023 0932   PLT 226 10/26/2023 0932   MCV 93 10/26/2023 0932   MCH 31.1 10/26/2023 0932   MCH 31.6 10/30/2019 0939   MCHC 33.6 10/26/2023 0932   MCHC 32.6 10/30/2019 0939   RDW 12.5 10/26/2023 0932   LYMPHSABS 1.7 10/26/2023 0932   MONOABS 0.4 08/22/2019 0310   EOSABS 0.1 10/26/2023 0932   BASOSABS 0.0 10/26/2023 0932     Parts of this note may have been dictated using voice recognition software. There may be variances in spelling and vocabulary which are unintentional. Not all errors are proofread. Please notify the dino if any discrepancies are noted or if the meaning of any statement is not clear.

## 2024-06-11 ENCOUNTER — Ambulatory Visit (INDEPENDENT_AMBULATORY_CARE_PROVIDER_SITE_OTHER): Admitting: Podiatry

## 2024-06-11 ENCOUNTER — Encounter: Payer: Self-pay | Admitting: Podiatry

## 2024-06-11 ENCOUNTER — Ambulatory Visit (INDEPENDENT_AMBULATORY_CARE_PROVIDER_SITE_OTHER)

## 2024-06-11 DIAGNOSIS — L600 Ingrowing nail: Secondary | ICD-10-CM | POA: Diagnosis not present

## 2024-06-11 DIAGNOSIS — M79671 Pain in right foot: Secondary | ICD-10-CM

## 2024-06-11 NOTE — Progress Notes (Signed)
 Patient presents with complaint in the fifth toe on the lateral and radial aspect of the distal toe.  Hurts him where walking and wearing shoes he builds up the callus and has to trim it down himself.   Physical exam:  General appearance: Pleasant, and in no acute distress. AOx3.  Vascular: Pedal pulses: DP 2/4 bilaterally, PT 1/4 bilaterally.  Minimal edema lower legs bilaterally. Capillary fill time immediate bilaterally.  Neurological: Light touch intact feet bilaterally.  Normal Achilles reflex bilaterally.  No clonus or spasticity noted.   Dermatologic:   Ingrown incurvated nail hallux right with some redness and thickening in the nail fold along the lateral border.  Hyperkeratotic lesions on medial and lateral aspects of the DIPJ of the fifth toe right skin normal temperature bilaterally.  Skin normal color, tone, and texture bilaterally.   Musculoskeletal: Hammertoe fifth toe right.  Normal range of motion joints of foot and good muscle strength foot and ankle bilaterally  Radiographs: 3 views right foot: Hammertoe fifth toe right.  No evidence of bone tumors.  Bone density.  Diagnosis: 1. pain right foot. 2.  Hammertoe fifth toe right 3. Ingrown nail 5 RT   Plan: -New patient visit office level 3 for evaluation and management. Discussed the ingrown nail and hammertoe deformities.  Discussed with him periodic debridement of the nails with padding versus surgical correction of the nail and/or hammertoe fifth toe right.  Discussed risk and benefits of these treatment options discussed proper shoes and socks. -Dispensed sleeve for fifth toe right  Return PRN

## 2024-07-02 ENCOUNTER — Telehealth: Payer: Self-pay | Admitting: "Endocrinology

## 2024-07-02 ENCOUNTER — Other Ambulatory Visit: Payer: Self-pay

## 2024-07-02 DIAGNOSIS — E119 Type 2 diabetes mellitus without complications: Secondary | ICD-10-CM

## 2024-07-02 MED ORDER — FREESTYLE LIBRE 3 READER DEVI: 1.0000 | 0 refills | Status: AC

## 2024-07-02 NOTE — Telephone Encounter (Signed)
 Patient arrived at clinic with new Free Style Livre 3 Plus sensor. Advised that he was told by Dr. Dartha to come in when he received the sensor and that staff would assist with first placement.

## 2024-07-02 NOTE — Telephone Encounter (Signed)
 Pt was in office today for assist with placement of libre sensor. While assisting pt I realized that pt  cell phone was not compatible with sensors. I informed pt that would need to send him in a reader and that he would nee to come back once he picked up the reader.

## 2024-07-22 ENCOUNTER — Other Ambulatory Visit: Payer: Self-pay | Admitting: "Endocrinology

## 2024-08-23 ENCOUNTER — Other Ambulatory Visit: Payer: Self-pay | Admitting: Critical Care Medicine

## 2024-08-23 DIAGNOSIS — E1165 Type 2 diabetes mellitus with hyperglycemia: Secondary | ICD-10-CM

## 2024-08-28 ENCOUNTER — Ambulatory Visit (INDEPENDENT_AMBULATORY_CARE_PROVIDER_SITE_OTHER): Admitting: "Endocrinology

## 2024-08-28 ENCOUNTER — Encounter: Payer: Self-pay | Admitting: "Endocrinology

## 2024-08-28 VITALS — BP 130/80 | HR 57 | Ht 75.0 in | Wt 184.0 lb

## 2024-08-28 DIAGNOSIS — E119 Type 2 diabetes mellitus without complications: Secondary | ICD-10-CM

## 2024-08-28 DIAGNOSIS — E782 Mixed hyperlipidemia: Secondary | ICD-10-CM

## 2024-08-28 DIAGNOSIS — Z7984 Long term (current) use of oral hypoglycemic drugs: Secondary | ICD-10-CM

## 2024-08-28 DIAGNOSIS — Z794 Long term (current) use of insulin: Secondary | ICD-10-CM | POA: Diagnosis not present

## 2024-08-28 MED ORDER — EMPAGLIFLOZIN 25 MG PO TABS: 25.0000 mg | ORAL_TABLET | Freq: Every day | ORAL | 1 refills | Status: AC

## 2024-08-28 MED ORDER — FREESTYLE LIBRE 3 PLUS SENSOR MISC: 3 refills | Status: AC

## 2024-08-28 NOTE — Progress Notes (Signed)
 Outpatient Endocrinology Note Cameron Birmingham, MD  08/28/24   Cameron Arroyo 02/05/1951 [id number]  Referring Provider: Stacia Millman, PA Primary Care Provider: Stacia Millman, PA Reason for consultation: Subjective   Assessment & Plan  Diagnoses and all orders for this visit:  Controlled type 2 diabetes mellitus without complication, with long-term current use of insulin  (HCC) -     Continuous Glucose Sensor (FREESTYLE LIBRE 3 PLUS SENSOR) MISC; Change sensor every 15 days. -     empagliflozin  (JARDIANCE ) 25 MG TABS tablet; Take 1 tablet (25 mg total) by mouth daily before breakfast.  Long term (current) use of oral hypoglycemic drugs  Mixed hypercholesterolemia and hypertriglyceridemia   Diabetes Type II with no known complications, No results found for: GFR Hba1c goal less than 7, current Hba1c is  Lab Results  Component Value Date   HGBA1C 6.1 (A) 05/31/2024   Will recommend the following: Metformin  1000 mg bid  Jardiance  25 mg every day Tresiba  5 units every day   No known contraindications/side effects to any of above medications  -Last LD and Tg are as follows: Lab Results  Component Value Date   LDLCALC 57 01/27/2024    Lab Results  Component Value Date   TRIG 159 (H) 01/27/2024   -On atorvastatin  10 mg QD -Follow low fat diet and exercise   -Blood pressure goal <140/90 - Microalbumin/creatinine goal is < 30 -Last MA/Cr is as follows: Lab Results  Component Value Date   MICROALBUR 0.9 01/27/2024   -not on ACE/ARB  -diet changes including salt restriction -limit eating outside -counseled BP targets per standards of diabetes care -uncontrolled blood pressure can lead to retinopathy, nephropathy and cardiovascular and atherosclerotic heart disease  Reviewed and counseled on: -A1C target -Blood sugar targets -Complications of uncontrolled diabetes  -Checking blood sugar before meals and bedtime and bring log next visit -All  medications with mechanism of action and side effects -Hypoglycemia management: rule of 15's, Glucagon Emergency Kit and medical alert ID -low-carb low-fat plate-method diet -At least 20 minutes of physical activity per day -Annual dilated retinal eye exam and foot exam -compliance and follow up needs -follow up as scheduled or earlier if problem gets worse  Call if blood sugar is less than 70 or consistently above 250    Take a 15 gm snack of carbohydrate at bedtime before you go to sleep if your blood sugar is less than 100.    If you are going to fast after midnight for a test or procedure, ask your physician for instructions on how to reduce/decrease your insulin  dose.    Call if blood sugar is less than 70 or consistently above 250  -Treating a low sugar by rule of 15  (15 gms of sugar every 15 min until sugar is more than 70) If you feel your sugar is low, test your sugar to be sure If your sugar is low (less than 70), then take 15 grams of a fast acting Carbohydrate (3-4 glucose tablets or glucose gel or 4 ounces of juice or regular soda) Recheck your sugar 15 min after treating low to make sure it is more than 70 If sugar is still less than 70, treat again with 15 grams of carbohydrate          Don't drive the hour of hypoglycemia  If unconscious/unable to eat or drink by mouth, use glucagon injection or nasal spray baqsimi and call 911. Can repeat again in 15 min if still unconscious.  Return in about 6 months (around 02/25/2025).   I have reviewed current medications, nurse's notes, allergies, vital signs, past medical and surgical history, family medical history, and social history for this encounter. Counseled patient on symptoms, examination findings, lab findings, imaging results, treatment decisions and monitoring and prognosis. The patient understood the recommendations and agrees with the treatment plan. All questions regarding treatment plan were fully answered.  Cameron Birmingham, MD  08/28/24  History of Present Illness Cameron Arroyo is a 73 y.o. year old male who presents for follow up of Type II diabetes mellitus.  Cameron Arroyo was first diagnosed in 2020.   Diabetes education -  Home diabetes regimen: Metformin  1000 mg bid  Jardiance  25 mg every day  Tresiba  6 units every day   COMPLICATIONS -  MI/Stroke -  retinopathy -  neuropathy -  nephropathy  BLOOD SUGAR DATA CGM interpretation: At today's visit, we reviewed her CGM downloads. The full report is scanned in the media. Reviewing the CGM trends, BG are well controleld across the day with some random rare lows and some highs with meals.   Physical Exam  BP 130/80   Pulse (!) 57   Ht 6' 3 (1.905 m)   Wt 184 lb (83.5 kg)   SpO2 98%   BMI 23.00 kg/m    Constitutional: well developed, well nourished Head: normocephalic, atraumatic Eyes: sclera anicteric, no redness Neck: supple Lungs: normal respiratory effort Neurology: alert and oriented Skin: dry, no appreciable rashes Musculoskeletal: no appreciable defects Psychiatric: normal mood and affect Diabetic Foot Exam - Simple   No data filed      Current Medications Patient's Medications  New Prescriptions   No medications on file  Previous Medications   APIXABAN  (ELIQUIS ) 5 MG TABS TABLET    Take 1 tablet (5 mg total) by mouth 2 (two) times daily.   ATORVASTATIN  (LIPITOR) 10 MG TABLET    Take 1 tablet (10 mg total) by mouth daily.   BD PEN NEEDLE NANO 2ND GEN 32G X 4 MM MISC    Use with Tresiba    BLOOD GLUCOSE MONITORING SUPPL (ONETOUCH VERIO REFLECT) W/DEVICE KIT    Use to check blood sugar once daily. Dx E11.65   BLOOD GLUCOSE MONITORING SUPPL (ONETOUCH VERIO) W/DEVICE KIT    Use to check blood sugar once daily. Dx E11.65   CONTINUOUS GLUCOSE RECEIVER (FREESTYLE LIBRE 3 READER) DEVI    1 Device by Does not apply route continuous.   GLUCOSE BLOOD (ONETOUCH VERIO) TEST STRIP    Use to check blood sugar twice daily. Dx  E11.65   INSULIN  DEGLUDEC (TRESIBA  FLEXTOUCH) 100 UNIT/ML FLEXTOUCH PEN    Inject 10 Units into the skin daily.   METFORMIN  (GLUCOPHAGE ) 1000 MG TABLET    Take 1 tablet (1,000 mg total) by mouth 2 (two) times daily with a meal.   METOPROLOL  TARTRATE (LOPRESSOR ) 25 MG TABLET    Take 0.5 tablets (12.5 mg total) by mouth daily.   ONETOUCH DELICA LANCETS 33G MISC    Use to check blood sugar twice daily. Dx E11.65  Modified Medications   Modified Medication Previous Medication   CONTINUOUS GLUCOSE SENSOR (FREESTYLE LIBRE 3 PLUS SENSOR) MISC Continuous Glucose Sensor (FREESTYLE LIBRE 3 PLUS SENSOR) MISC      Change sensor every 15 days.    Change sensor every 15 days.   EMPAGLIFLOZIN  (JARDIANCE ) 25 MG TABS TABLET empagliflozin  (JARDIANCE ) 25 MG TABS tablet      Take 1 tablet (25  mg total) by mouth daily before breakfast.    Take 1 tablet (25 mg total) by mouth daily before breakfast.  Discontinued Medications   No medications on file    Allergies No Known Allergies  Past Medical History Past Medical History:  Diagnosis Date   Arthritis    Asthma    as child   Atrial flutter with rapid ventricular response (HCC) 08/20/2019   Lumbar disc herniation 11/01/2019   New onset type 2 diabetes mellitus (HCC) 08/20/2019   Pneumonia    history of    Past Surgical History Past Surgical History:  Procedure Laterality Date   ANTERIOR CERVICAL DECOMP/DISCECTOMY FUSION N/A 03/01/2013   Procedure: Cervical Four-Five Cervical Five-Six Cervical Six-Seven anterior cervical decompression with fusion plating and bonegraft;  Surgeon: Lamar LELON Peaches, MD;  Location: [facility];  Service: Neurosurgery;  Laterality: N/A;  ANTERIOR CERVICAL DECOMPRESSION/DISCECTOMY FUSION 3 LEVELS   CARDIOVERSION N/A 08/22/2019   Procedure: CARDIOVERSION;  Surgeon: Kate Lonni CROME, MD;  Location: [facility] ENDOSCOPY;  Service: Endoscopy;  Laterality: N/A;   COLONOSCOPY WITH PROPOFOL  N/A 01/09/2024   Procedure: COLONOSCOPY WITH  PROPOFOL ;  Surgeon: Onita Elspeth Sharper, DO;  Location: [facility] ENDOSCOPY;  Service: Gastroenterology;  Laterality: N/A;   POLYPECTOMY  01/09/2024   Procedure: POLYPECTOMY;  Surgeon: Onita Elspeth Sharper, DO;  Location: [facility] ENDOSCOPY;  Service: Gastroenterology;;   TEE WITHOUT CARDIOVERSION N/A 08/22/2019   Procedure: TRANSESOPHAGEAL ECHOCARDIOGRAM (TEE);  Surgeon: Kate Lonni CROME, MD;  Location: [facility] ENDOSCOPY;  Service: Endoscopy;  Laterality: N/A;    Family History family history includes Cancer in his father; Cirrhosis in his mother.  Social History Social History   Socioeconomic History   Marital status: Married    Spouse name: Not on file   Number of children: Not on file   Years of education: Not on file   Highest education level: Not on file  Occupational History   Not on file  Tobacco Use   Smoking status: Former    Current packs/day: 0.00    Types: Cigarettes    Quit date: 11/29/1992    Years since quitting: 31.7   Smokeless tobacco: Never  Vaping Use   Vaping status: Never Used  Substance and Sexual Activity   Alcohol use: No   Drug use: Never   Sexual activity: Not Currently  Other Topics Concern   Not on file  Social History Narrative   Not on file   Social Drivers of Health   Financial Resource Strain: Low Risk  (03/16/2023)   Overall Financial Resource Strain (CARDIA)    Difficulty of Paying Living Expenses: Not hard at all  Food Insecurity: No Food Insecurity (03/16/2023)   Hunger Vital Sign    Worried About Running Out of Food in the Last Year: Never true    Ran Out of Food in the Last Year: Never true  Transportation Needs: No Transportation Needs (03/16/2023)   PRAPARE - Administrator, Civil Service (Medical): No    Lack of Transportation (Non-Medical): No  Physical Activity: Inactive (03/16/2023)   Exercise Vital Sign    Days of Exercise per Week: 0 days    Minutes of Exercise per Session: 0 min  Stress: No Stress Concern Present  (03/16/2023)   [employer] of Occupational Health - Occupational Stress Questionnaire    Feeling of Stress : Not at all  Social Connections: Not on file  Intimate Partner Violence: Not on file    Lab Results  Component Value Date  HGBA1C 6.1 (A) 05/31/2024   HGBA1C 6.8 (H) 01/27/2024   HGBA1C 5.8 05/31/2023   Lab Results  Component Value Date   CHOL 118 01/27/2024   Lab Results  Component Value Date   HDL 37 (L) 01/27/2024   Lab Results  Component Value Date   LDLCALC 57 01/27/2024   Lab Results  Component Value Date   TRIG 159 (H) 01/27/2024   Lab Results  Component Value Date   CHOLHDL 3.2 01/27/2024   Lab Results  Component Value Date   CREATININE 0.73 01/27/2024   No results found for: GFR Lab Results  Component Value Date   MICROALBUR 0.9 01/27/2024      Component Value Date/Time   NA 142 01/27/2024 1022   NA 142 10/26/2023 0932   K 4.9 01/27/2024 1022   CL 105 01/27/2024 1022   CO2 28 01/27/2024 1022   GLUCOSE 111 (H) 01/27/2024 1022   BUN 17 01/27/2024 1022   BUN 14 10/26/2023 0932   CREATININE 0.73 01/27/2024 1022   CALCIUM  9.6 01/27/2024 1022   PROT 6.7 01/27/2024 1022   PROT 6.6 02/14/2023 1045   ALBUMIN 4.5 10/26/2023 0932   AST 13 01/27/2024 1022   ALT 13 01/27/2024 1022   ALKPHOS 86 02/14/2023 1045   BILITOT 1.0 01/27/2024 1022   BILITOT 0.7 02/14/2023 1045   GFRNONAA 97 10/01/2020 0927   GFRAA 112 10/01/2020 0927      Latest Ref Rng & Units 01/27/2024   10:22 AM 10/26/2023    9:32 AM 02/14/2023   10:45 AM  BMP  Glucose 65 - 99 mg/dL 888  CANCELED  848   BUN 7 - 25 mg/dL 17  14  15    Creatinine 0.70 - 1.28 mg/dL 9.26  9.14  9.23   BUN/Creat Ratio 6 - 22 (calc) SEE NOTE:  16  20   Sodium 135 - 146 mmol/L 142  142  141   Potassium 3.5 - 5.3 mmol/L 4.9  CANCELED  4.5   Chloride 98 - 110 mmol/L 105  104  101   CO2 20 - 32 mmol/L 28  16    Calcium  8.6 - 10.3 mg/dL 9.6  9.5  9.8        Component Value Date/Time   WBC 6.6  10/26/2023 0932   WBC 5.8 10/30/2019 0939   RBC 4.88 10/26/2023 0932   RBC 4.53 10/30/2019 0939   HGB 15.2 10/26/2023 0932   HCT 45.2 10/26/2023 0932   PLT 226 10/26/2023 0932   MCV 93 10/26/2023 0932   MCH 31.1 10/26/2023 0932   MCH 31.6 10/30/2019 0939   MCHC 33.6 10/26/2023 0932   MCHC 32.6 10/30/2019 0939   RDW 12.5 10/26/2023 0932   LYMPHSABS 1.7 10/26/2023 0932   MONOABS 0.4 08/22/2019 0310   EOSABS 0.1 10/26/2023 0932   BASOSABS 0.0 10/26/2023 0932     Parts of this note may have been dictated using voice recognition software. There may be variances in spelling and vocabulary which are unintentional. Not all errors are proofread. Please notify the dino if any discrepancies are noted or if the meaning of any statement is not clear.

## 2024-08-29 ENCOUNTER — Encounter: Payer: Self-pay | Admitting: "Endocrinology

## 2024-09-04 ENCOUNTER — Ambulatory Visit: Admitting: "Endocrinology

## 2024-09-21 LAB — LAB REPORT - SCANNED: A1c: 6.4

## 2025-02-26 ENCOUNTER — Ambulatory Visit: Admitting: "Endocrinology
